# Patient Record
Sex: Male | Born: 1940
Health system: Southern US, Community
[De-identification: ages and names within clinical notes are randomized; demographics above are authoritative.]

## PROBLEM LIST (undated history)

## (undated) DIAGNOSIS — M109 Gout, unspecified: Secondary | ICD-10-CM

## (undated) DIAGNOSIS — J3089 Other allergic rhinitis: Secondary | ICD-10-CM

## (undated) DIAGNOSIS — G4730 Sleep apnea, unspecified: Secondary | ICD-10-CM

## (undated) DIAGNOSIS — K5790 Diverticulosis of intestine, part unspecified, without perforation or abscess without bleeding: Secondary | ICD-10-CM

## (undated) DIAGNOSIS — C801 Malignant (primary) neoplasm, unspecified: Secondary | ICD-10-CM

## (undated) DIAGNOSIS — R7303 Prediabetes: Secondary | ICD-10-CM

## (undated) DIAGNOSIS — M1990 Unspecified osteoarthritis, unspecified site: Secondary | ICD-10-CM

## (undated) DIAGNOSIS — H9190 Unspecified hearing loss, unspecified ear: Secondary | ICD-10-CM

## (undated) DIAGNOSIS — K219 Gastro-esophageal reflux disease without esophagitis: Secondary | ICD-10-CM

## (undated) DIAGNOSIS — I10 Essential (primary) hypertension: Secondary | ICD-10-CM

## (undated) DIAGNOSIS — H04123 Dry eye syndrome of bilateral lacrimal glands: Secondary | ICD-10-CM

## (undated) DIAGNOSIS — J329 Chronic sinusitis, unspecified: Secondary | ICD-10-CM

## (undated) DIAGNOSIS — E7800 Pure hypercholesterolemia, unspecified: Secondary | ICD-10-CM

## (undated) DIAGNOSIS — H409 Unspecified glaucoma: Secondary | ICD-10-CM

## (undated) DIAGNOSIS — L570 Actinic keratosis: Secondary | ICD-10-CM

## (undated) DIAGNOSIS — G245 Blepharospasm: Secondary | ICD-10-CM

## (undated) HISTORY — PX: NASAL SINUS SURGERY: SHX719

## (undated) HISTORY — PX: HERNIA REPAIR: SHX51

## (undated) HISTORY — PX: OTHER SURGICAL HISTORY: SHX169

## (undated) HISTORY — PX: COLONOSCOPY W/ BIOPSIES AND POLYPECTOMY: SHX1376

---

## 2007-04-17 ENCOUNTER — Encounter: Admission: RE | Admit: 2007-04-17 | Discharge: 2007-04-17 | Payer: Self-pay | Admitting: Orthopaedic Surgery

## 2007-05-23 ENCOUNTER — Ambulatory Visit (HOSPITAL_COMMUNITY): Admission: RE | Admit: 2007-05-23 | Discharge: 2007-05-23 | Payer: Self-pay | Admitting: Orthopaedic Surgery

## 2009-03-23 ENCOUNTER — Ambulatory Visit (HOSPITAL_BASED_OUTPATIENT_CLINIC_OR_DEPARTMENT_OTHER): Admission: RE | Admit: 2009-03-23 | Discharge: 2009-03-23 | Payer: Self-pay | Admitting: Orthopedic Surgery

## 2010-04-13 LAB — BASIC METABOLIC PANEL
CO2: 33 mEq/L — ABNORMAL HIGH (ref 19–32)
Calcium: 8.8 mg/dL (ref 8.4–10.5)
Creatinine, Ser: 0.85 mg/dL (ref 0.4–1.5)
Glucose, Bld: 90 mg/dL (ref 70–99)

## 2010-06-07 NOTE — Op Note (Signed)
NAMEVERDIS, KOVAL               ACCOUNT NO.:  [id number]   MEDICAL RECORD NO.:  [id number]          PATIENT TYPE:  AMB   LOCATION:  SDS                          FACILITY:  [facility]   PHYSICIAN:  Lubertha Basque. Dalldorf, M.D.DATE OF BIRTH:  [birth year]   DATE OF PROCEDURE:  05/23/2007  DATE OF DISCHARGE:  05/23/2007                               OPERATIVE REPORT   PREOPERATIVE DIAGNOSES:  1. Right shoulder rotator cuff tear.  2. Right shoulder impingement.   POSTOPERATIVE DIAGNOSES:  1. Right shoulder rotator cuff tear.  2. Right shoulder biceps degeneration.  3. Right shoulder impingement.   PROCEDURES:  1. Right shoulder arthroscopic debridement.  2. Right shoulder arthroscopic acromioplasty.  3. Right shoulder arthroscopic partial claviculectomy.   ANESTHESIA:  General and block.   ATTENDING SURGEON:  Lubertha Basque. Jerl Santos, MD.   ASSISTANT:  Lindwood Qua, PA.   INDICATIONS FOR PROCEDURE:  The patient is a 70 year old male with a  long history of right shoulder pain.  This persisted despite oral anti-  inflammatories and injections and therapy.  By MRI has a retracted  chronic rotator cuff tear.  He has pain which limits his ability to rest  and use his arm.  He was offered arthroscopic debridement and  acromioplasty in hopes of ameliorating his symptoms somewhat and  improving his function.  Informed operative consent was obtained after  discussion of possible complications of reaction to anesthesia and  infection.   SUMMARY OF FINDINGS AND PROCEDURE:  Under general anesthesia and block,  a right shoulder arthroscopy was performed.  The glenohumeral joint  showed no degenerative changes.  His biceps tendon was about 50% torn  and this was addressed with a tenotomy.  He had a large tear of the  rotator cuff retracted back near the glenoid and this was debrided a  little bit further back.  He had a prominent subacromial morphology  addressed with a conservative acromioplasty.   He also had a prominence  of the distal clavicle addressed with a resection of the undersurface of  this bone.   DESCRIPTION OF THE PROCEDURE:  The patient was taken to the operating  suite where general anesthetic was applied without difficulty.  He was  also given a block in the preanesthesia area.  He was positioned in the  beach chair position and prepped and draped in normal sterile fashion.  After administration of IV Kefzol, an arthroscopy of the right shoulder  was performed to the total of 3 portals.  Findings were as noted above.  Procedure consisted of the biceps tenotomy back to the level of glenoid  for this very degenerative tendon.  We then debrided the rotator cuff of  portions of this structure which could be impinging the joint.  We took  the central portion back to the level of the glenoid.  He was left with  anterior and posterior sleeves of tissue.  I performed an acromioplasty  with a bur in the lateral position followed by transfer of the bur to  the posterior position.  I also resected the distal clavicle  undersurface where there  was a smaller spur.  The shoulder was  thoroughly irrigated followed by reapproximation of portals loosely with  nylon.  Adaptic was applied followed by dry gauze tape.  Estimated blood  loss and IV fluids can be obtained from anesthesia records.   DISPOSITION:  The patient was extubated in the operating room and taken  to recovery room in stable condition.  Plans were for him to be  monitored there for a while with probable discharge to home this  afternoon.  If he does end up going home, I will contact him by phone  tonight.      Lubertha Basque Jerl Santos, M.D.  Electronically Signed     PGD/MEDQ  D:  05/23/2007  T:  05/24/2007  Job:  [id number]

## 2010-10-18 LAB — BASIC METABOLIC PANEL
CO2: 31
Calcium: 9.3
Creatinine, Ser: 0.9
Glucose, Bld: 112 — ABNORMAL HIGH
Sodium: 138

## 2010-10-18 LAB — CBC
Hemoglobin: 14
MCHC: 34.3
MCV: 99.3
RDW: 12.7

## 2010-12-09 ENCOUNTER — Encounter: Payer: Self-pay | Admitting: Emergency Medicine

## 2010-12-09 ENCOUNTER — Emergency Department (HOSPITAL_COMMUNITY): Payer: Medicare Other

## 2010-12-09 ENCOUNTER — Emergency Department (HOSPITAL_COMMUNITY)
Admission: EM | Admit: 2010-12-09 | Discharge: 2010-12-09 | Disposition: A | Discharge status: Home / Self Care | Payer: Medicare Other | Attending: Emergency Medicine | Admitting: Emergency Medicine

## 2010-12-09 DIAGNOSIS — I2510 Atherosclerotic heart disease of native coronary artery without angina pectoris: Secondary | ICD-10-CM | POA: Insufficient documentation

## 2010-12-09 DIAGNOSIS — R109 Unspecified abdominal pain: Secondary | ICD-10-CM

## 2010-12-09 DIAGNOSIS — K5730 Diverticulosis of large intestine without perforation or abscess without bleeding: Secondary | ICD-10-CM | POA: Insufficient documentation

## 2010-12-09 DIAGNOSIS — R110 Nausea: Secondary | ICD-10-CM | POA: Insufficient documentation

## 2010-12-09 DIAGNOSIS — I10 Essential (primary) hypertension: Secondary | ICD-10-CM | POA: Insufficient documentation

## 2010-12-09 HISTORY — DX: Unspecified osteoarthritis, unspecified site: M19.90

## 2010-12-09 HISTORY — DX: Essential (primary) hypertension: I10

## 2010-12-09 LAB — CBC
HCT: 40.7 % (ref 39.0–52.0)
Hemoglobin: 14.6 g/dL (ref 13.0–17.0)
MCH: 34.1 pg — ABNORMAL HIGH (ref 26.0–34.0)
MCHC: 35.9 g/dL (ref 30.0–36.0)
MCV: 95.1 fL (ref 78.0–100.0)

## 2010-12-09 LAB — COMPREHENSIVE METABOLIC PANEL
BUN: 10 mg/dL (ref 6–23)
CO2: 32 mEq/L (ref 19–32)
Calcium: 9.3 mg/dL (ref 8.4–10.5)
Creatinine, Ser: 0.78 mg/dL (ref 0.50–1.35)
GFR calc Af Amer: >90 mL/min (ref >90)
GFR calc non Af Amer: >90 mL/min (ref >90)
Glucose, Bld: 143 mg/dL — ABNORMAL HIGH (ref 70–99)
Sodium: 137 mEq/L (ref 135–145)
Total Protein: 7 g/dL (ref 6.0–8.3)

## 2010-12-09 LAB — URINALYSIS, ROUTINE W REFLEX MICROSCOPIC
Hgb urine dipstick: NEGATIVE
Ketones, ur: NEGATIVE mg/dL
Protein, ur: NEGATIVE mg/dL
Urobilinogen, UA: 0.2 mg/dL (ref 0.0–1.0)

## 2010-12-09 LAB — URINE MICROSCOPIC-ADD ON

## 2010-12-09 MED ORDER — OXYCODONE-ACETAMINOPHEN 5-325 MG PO TABS: 1.0000 | ORAL_TABLET | ORAL | Status: AC | PRN

## 2010-12-09 NOTE — ED Notes (Signed)
Pt brought to room my triage NT via wheelchair, pt assisted into bed and into hospital gown.

## 2010-12-09 NOTE — ED Notes (Signed)
C/o right lower quadrant pain radiating into right flank region--Onset Wednesday with gradual worsening--Also c/o nausea but, no actual vomiting--Rates his pain a 4 on 1-10 scale--Denies any urinary symptoms

## 2010-12-09 NOTE — ED Provider Notes (Addendum)
History     CSN: [id number] Arrival date & time: 12/09/2010 10:22 AM   First MD Initiated Contact with Patient 12/09/10 1029      Chief Complaint  Patient presents with  . Flank Pain    (Consider location/radiation/quality/duration/timing/severity/associated sxs/prior treatment) Patient is a 71 y.o. male presenting with flank pain. The history is provided by the patient and the spouse.  Flank Pain The current episode started yesterday. The problem has been gradually worsening.  Christopher Acevedo is a 70 y.o. male presents with c/o right flank pain leading to desire to be assessed in the ED. The sx(s) have been present for 2 days ago. Additional concerns are nausea. Causative factors are nothing. Palliative factors are nothing. The distress associated is moderate. The disorder has been present for 2 days.  Past Medical History  Diagnosis Date  . Hypertension   . Arthritis   . COPD (chronic obstructive pulmonary disease)   . Coronary artery disease     History reviewed. No pertinent past surgical history.  Family History  Problem Relation Age of Onset  . Rheum arthritis Mother     History  Substance Use Topics  . Smoking status: Former Games developer  . Smokeless tobacco: Not on file  . Alcohol Use: 2.4 oz/week    4 Shots of liquor per week      Review of Systems  Genitourinary: Positive for flank pain.  All other systems reviewed and are negative.    Allergies  Codeine  Home Medications   Current Outpatient Rx  Name Route Sig Dispense Refill  . AMLODIPINE BESYLATE 10 MG PO TABS Oral Take 5 mg by mouth daily. Pt takes 1/2 tab     . CARBOXYMETH-GLYCERIN-POLYSORB 0.5-1-0.5 % OP SOLN Ophthalmic Apply 1 drop to eye daily.      Marland Kitchen VITAMIN D 1000 UNITS PO TABS Oral Take 1,000 Units by mouth daily.      . IBUPROFEN 600 MG PO TABS Oral Take 600 mg by mouth every 6 (six) hours as needed. pain     . FISH OIL 1000 MG PO CAPS Oral Take 1 capsule by mouth daily.      Marland Kitchen OMEPRAZOLE  20 MG PO CPDR Oral Take 20 mg by mouth daily.      Marland Kitchen SIMVASTATIN 20 MG PO TABS Oral Take 20 mg by mouth at bedtime. Pt takes 1/2 tab     . TRAMADOL HCL 50 MG PO TABS Oral Take 50 mg by mouth every 6 (six) hours as needed. Maximum dose= 8 tablets per day  pain     . TRAVOPROST 0.004 % OP SOLN Left Eye Place 1 drop into the left eye every morning.        BP 132/83  Pulse 66  Temp(Src) 97.9 F (36.6 C) (Oral)  Resp 16  SpO2 99%  Physical Exam  Nursing note and vitals reviewed. Constitutional: He is oriented to person, place, and time. He appears well-developed and well-nourished.  HENT:  Head: Normocephalic and atraumatic.  Right Ear: External ear normal.  Left Ear: External ear normal.  Eyes: Conjunctivae and EOM are normal. Pupils are equal, round, and reactive to light.  Neck: Normal range of motion and phonation normal. Neck supple.  Cardiovascular: Normal rate, regular rhythm, normal heart sounds and intact distal pulses.   Pulmonary/Chest: Effort normal and breath sounds normal. He exhibits no bony tenderness.  Abdominal: Soft. Normal appearance. There is no tenderness (diffuse right abdominal tenderness, mild.).  Musculoskeletal: Normal range of  motion.  Neurological: He is alert and oriented to person, place, and time. He has normal strength. No cranial nerve deficit or sensory deficit. He exhibits normal muscle tone. Coordination normal.  Skin: Skin is warm, dry and intact.  Psychiatric: He has a normal mood and affect. His behavior is normal. Judgment and thought content normal.    ED Course  Procedures (including critical care time)  Patient was reevaluated. He had no further complaints. Does with the patient and his wife.    Labs Reviewed  URINALYSIS, ROUTINE W REFLEX MICROSCOPIC - Abnormal; Notable for the following:    APPearance TURBID (*)    pH 8.5 (*)    All other components within normal limits  URINE MICROSCOPIC-ADD ON - Abnormal; Notable for the following:     Bacteria, UA FEW (*)    All other components within normal limits  CBC - Abnormal; Notable for the following:    MCH 34.1 (*)    All other components within normal limits  COMPREHENSIVE METABOLIC PANEL - Abnormal; Notable for the following:    Potassium 3.4 (*)    Glucose, Bld 143 (*)    All other components within normal limits  URINE CULTURE  LAB REPORT - SCANNED   No results found.   1. Flank pain       MDM  Patient has subacute right flank to right abdominal pain with no clear source of the pain. He does have degenerative joint disease visualized on the CAT scan when I reviewed it. He does not have apparent diverticulitis and there is no evidence for appendicitis. He is stable for discharge with symptomatic treatment.        Flint Melter, MD 12/09/10 1720  Flint Melter, MD 02/09/11 1322  Flint Melter, MD 02/09/11 1341

## 2010-12-11 LAB — URINE CULTURE
Colony Count: 75000
Culture  Setup Time: 201211162322

## 2010-12-12 NOTE — ED Notes (Signed)
+   Urine Chart sent to EDP office for review. 

## 2011-02-07 ENCOUNTER — Other Ambulatory Visit: Payer: Self-pay | Admitting: Gastroenterology

## 2011-04-11 ENCOUNTER — Other Ambulatory Visit (HOSPITAL_COMMUNITY): Payer: Self-pay | Admitting: Gastroenterology

## 2011-04-11 DIAGNOSIS — K635 Polyp of colon: Secondary | ICD-10-CM

## 2011-04-18 ENCOUNTER — Ambulatory Visit (HOSPITAL_COMMUNITY)
Admission: RE | Admit: 2011-04-18 | Discharge: 2011-04-18 | Disposition: A | Discharge status: Home / Self Care | Payer: Medicare Other | Source: Ambulatory Visit | Attending: Gastroenterology | Admitting: Gastroenterology

## 2011-04-18 DIAGNOSIS — K5730 Diverticulosis of large intestine without perforation or abscess without bleeding: Secondary | ICD-10-CM | POA: Insufficient documentation

## 2011-04-18 DIAGNOSIS — D126 Benign neoplasm of colon, unspecified: Secondary | ICD-10-CM | POA: Insufficient documentation

## 2011-04-18 DIAGNOSIS — K635 Polyp of colon: Secondary | ICD-10-CM

## 2011-07-12 ENCOUNTER — Other Ambulatory Visit: Payer: Self-pay | Admitting: Family Medicine

## 2011-07-18 ENCOUNTER — Ambulatory Visit
Admission: RE | Admit: 2011-07-18 | Discharge: 2011-07-18 | Disposition: A | Discharge status: Home / Self Care | Payer: Medicare Other | Source: Ambulatory Visit | Attending: Family Medicine | Admitting: Family Medicine

## 2011-07-25 ENCOUNTER — Other Ambulatory Visit (HOSPITAL_COMMUNITY): Payer: Self-pay | Admitting: Family Medicine

## 2011-07-26 ENCOUNTER — Other Ambulatory Visit (HOSPITAL_COMMUNITY): Payer: Self-pay | Admitting: Family Medicine

## 2011-07-26 DIAGNOSIS — I714 Abdominal aortic aneurysm, without rupture, unspecified: Secondary | ICD-10-CM

## 2011-08-08 ENCOUNTER — Ambulatory Visit (HOSPITAL_COMMUNITY)
Admission: RE | Admit: 2011-08-08 | Discharge: 2011-08-08 | Disposition: A | Discharge status: Home / Self Care | Payer: Medicare Other | Source: Ambulatory Visit | Attending: Family Medicine | Admitting: Family Medicine

## 2011-08-08 DIAGNOSIS — I714 Abdominal aortic aneurysm, without rupture, unspecified: Secondary | ICD-10-CM | POA: Insufficient documentation

## 2011-08-08 DIAGNOSIS — K5730 Diverticulosis of large intestine without perforation or abscess without bleeding: Secondary | ICD-10-CM | POA: Insufficient documentation

## 2011-08-08 MED ORDER — IOHEXOL 350 MG/ML SOLN
100.0000 mL | Freq: Once | INTRAVENOUS | Status: AC | PRN
  Administered 2011-08-08: 100 mL via INTRAVENOUS

## 2013-08-02 IMAGING — CT CT CTA ABD/PEL W/CM AND/OR W/O CM
2 of 6 series · 14 of 46 positions shown, 18 images · IV contrast (APPLIED)
Comparison: Abdominal CT 12/09/2010

CLINICAL DATA: Evaluate abdominal aortic aneurysm.

CT ANGIOGRAPHY ABDOMEN AND PELVIS
TECHNIQUE: Multidetector CT imaging of the abdomen and pelvis was
performed using the standard protocol during bolus administration
of intravenous contrast.  Multiplanar reconstructed images
including MIPs were obtained and reviewed to evaluate the vascular
anatomy.
Contrast: 100mL OMNIPAQUE IOHEXOL 350 MG/ML SOLN

[Series 6: dissection 2.0 st · axial · 0.92mm/px · z∈[-586,-192]mm · 11 of 227 slices shown, 15 images]
[im 20/227  soft-tissue]
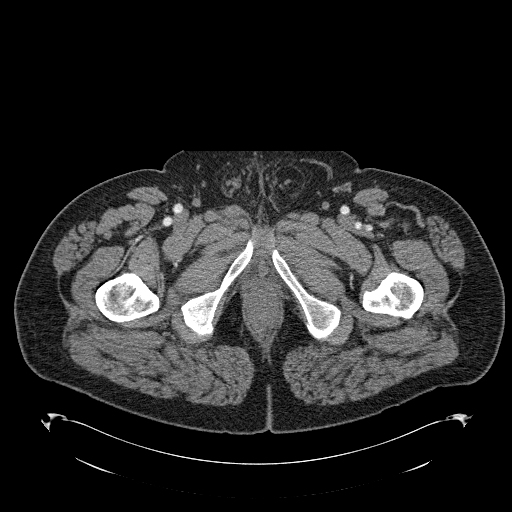
[im 20/227  bone]
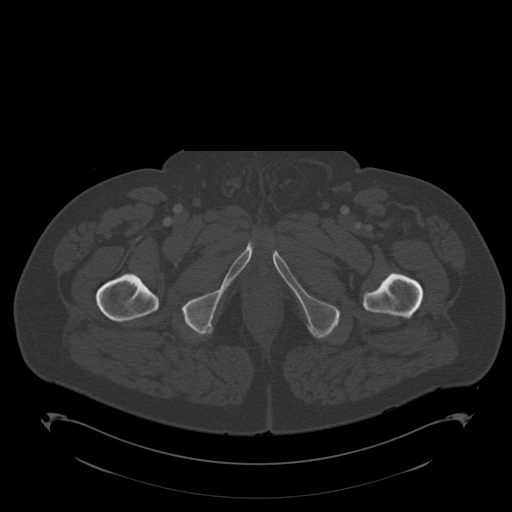
[im 40/227  soft-tissue]
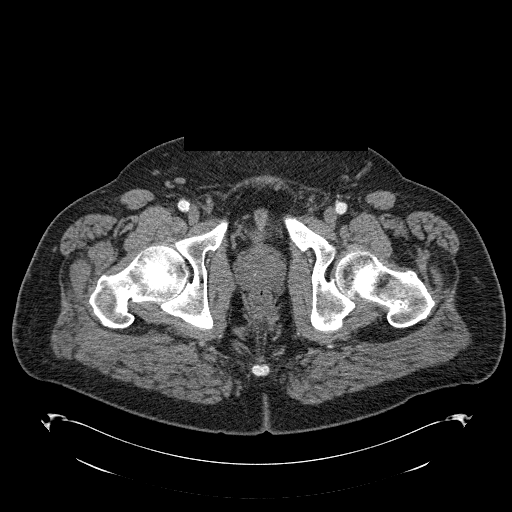
[im 69/227  soft-tissue]
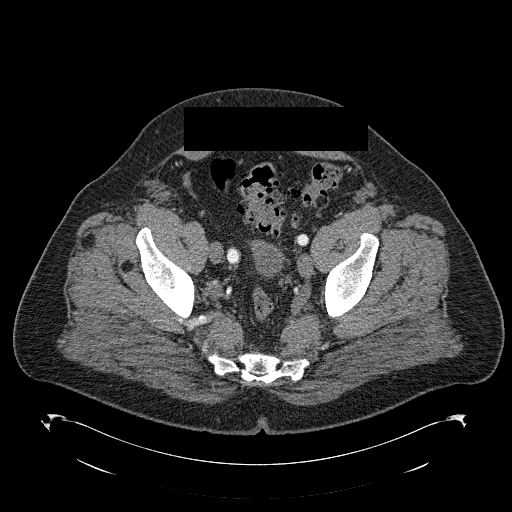
[im 89/227  soft-tissue]
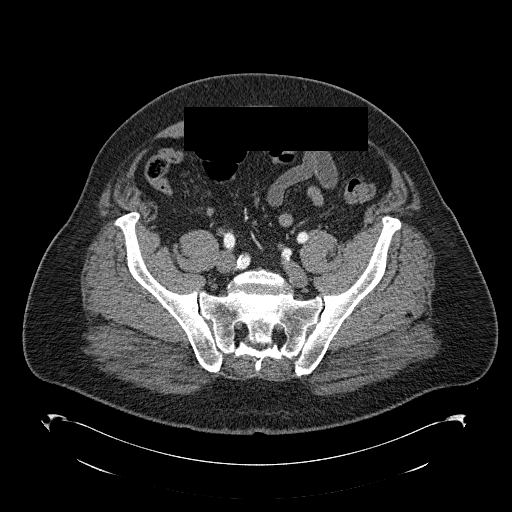
[im 118/227  soft-tissue]
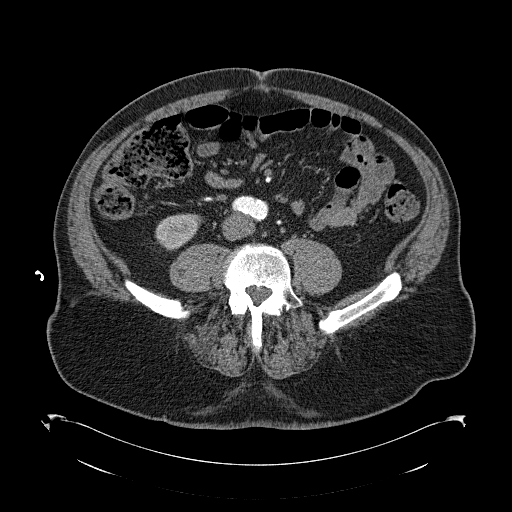
[im 138/227  soft-tissue]
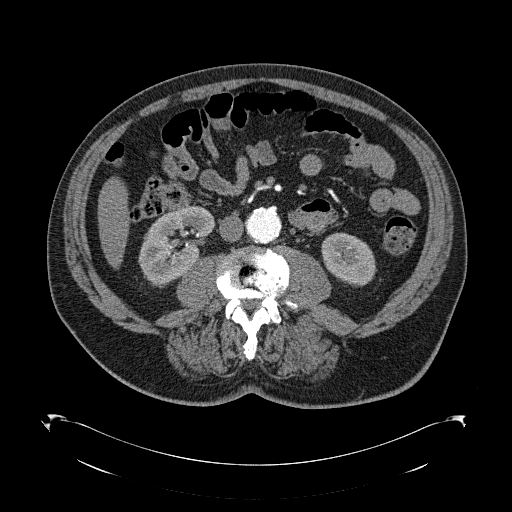
[im 158/227  soft-tissue]
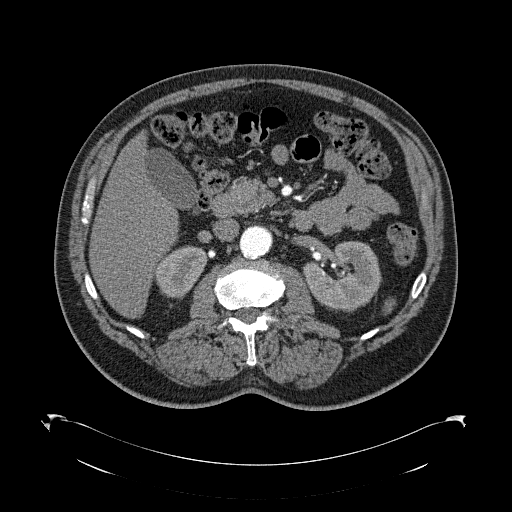
[im 187/227  soft-tissue]
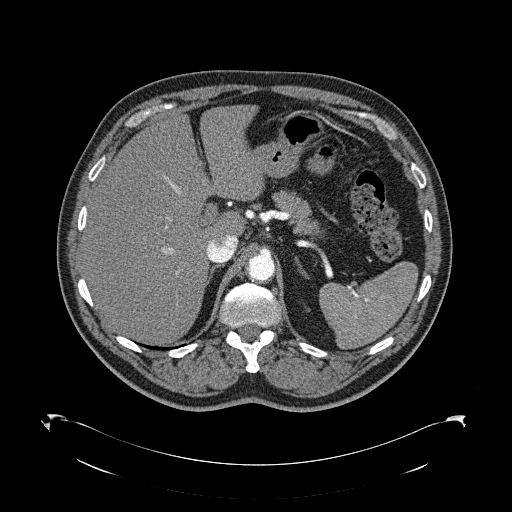
[im 187/227  lung]
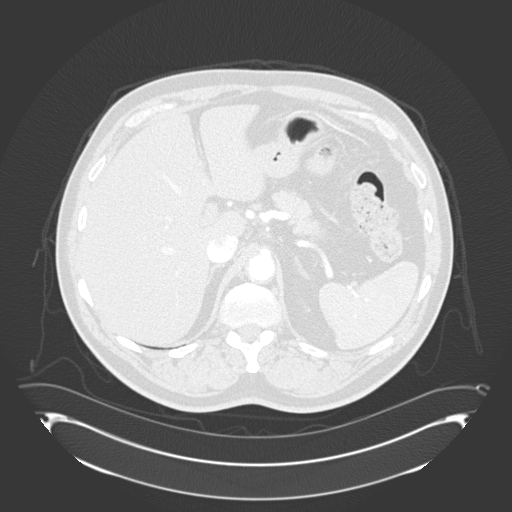
[im 197/227  lung]
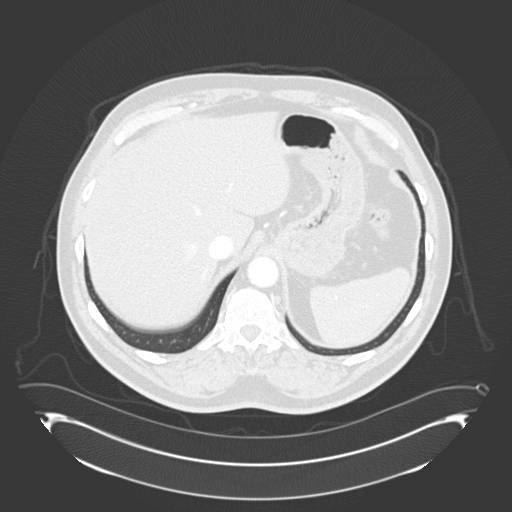
[im 207/227  soft-tissue]
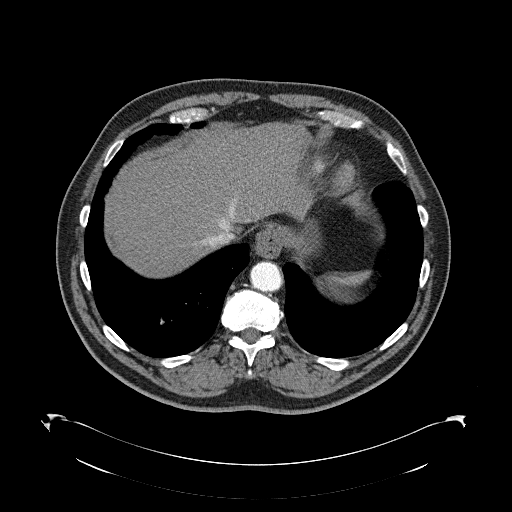
[im 207/227  lung]
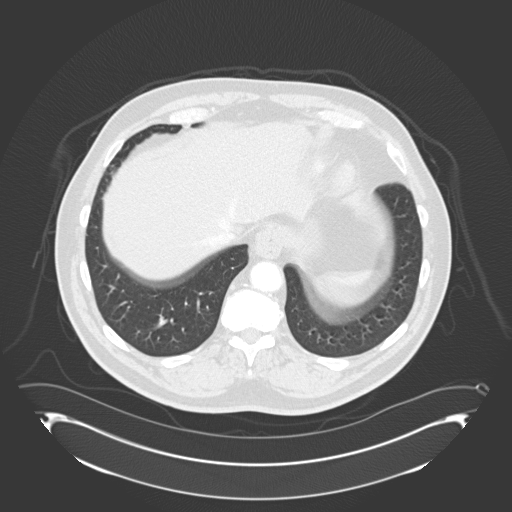
[im 207/227  bone]
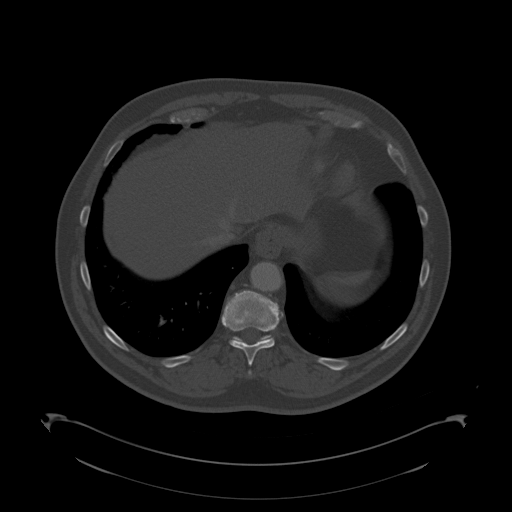
[im 217/227  lung]
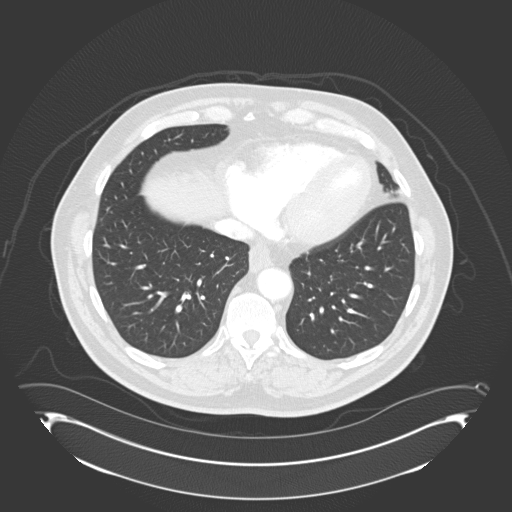

[Series 602: coronal · coronal · 0.92mm/px · 3 of 109 slices shown]
[im 28/109  soft-tissue]
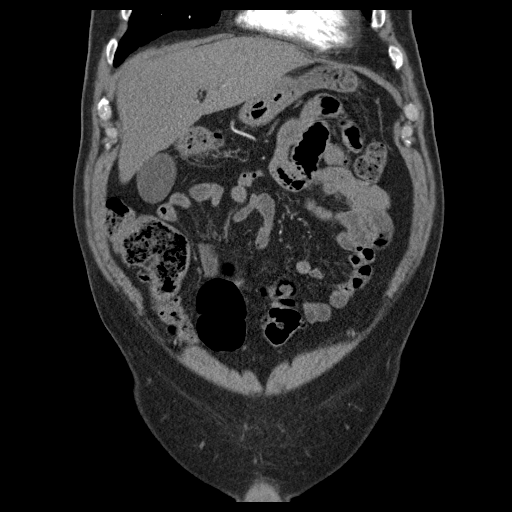
[im 55/109  soft-tissue]
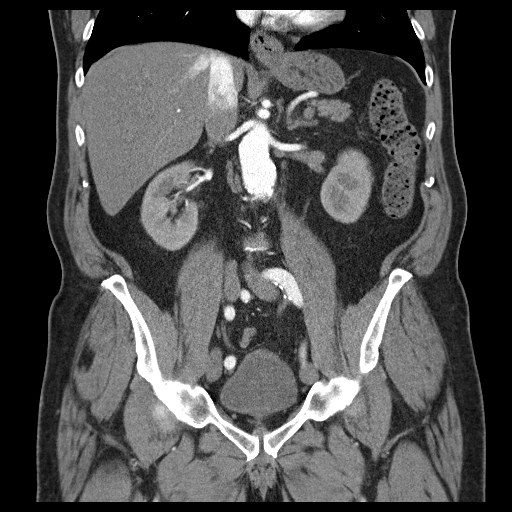
[im 82/109  soft-tissue]
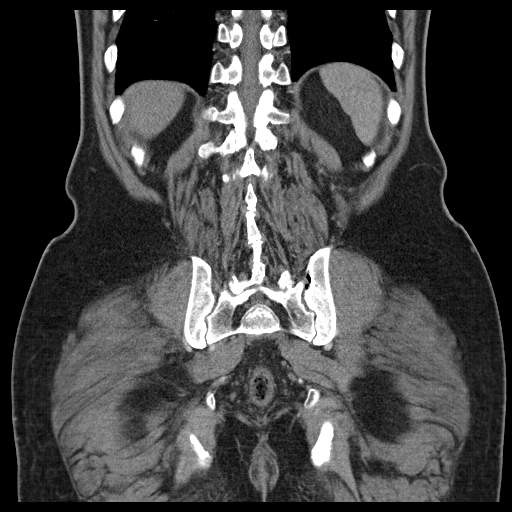

[14 of 46 positions shown; findings below may reference images not displayed]

FINDINGS: Vascular structures: The abdominal aorta is patent with
mild atherosclerotic changes.  The celiac trunk, superior
mesenteric artery, bilateral renal arteries and inferior mesenteric
artery are patent.  No significant visceral artery stenosis.  There
is an infrarenal abdominal aortic aneurysm that measures up to
cm in the AP dimension and unchanged from 1141.  Stable ectasia of
the right common iliac artery measuring up to 1.5 cm.  The iliac
arteries are patent bilaterally.  The common femoral arteries and
proximal femoral arteries are patent bilaterally.

Nonvascular structures:  The lung bases are clear.  Evaluation of
the intra-abdominal organs is limited due to the arterial phase
contrast.  There is slightly decreased attenuation of the liver
parenchyma.  There is no gross abnormality to the liver,
gallbladder, spleen, pancreas, adrenal glands or kidneys.  There is
no evidence for abdominal lymphadenopathy or free fluid.  The
appendix has a normal appearance.  There is extensive
diverticulosis involving the sigmoid colon but no acute
inflammatory changes.  No gross abnormality to the prostate or
seminal vesicles.  Again noted are two small calcifications along
the anterior aspect of the urinary bladder and one calcification
may be within the bladder wall.  These findings are similar to the
prior examination.

There is a right L5 pars defect which is stable.  Marked
degenerative changes in the lumbar spine.  Review of the MIP images
confirms the above findings.
IMPRESSION: Stable abdominal aortic aneurysm measuring up to 3.6 cm.

No acute abdominal or pelvic findings.

Sigmoid diverticulosis without acute colonic inflammation.

Stable calcifications along the anterior wall of the urinary
bladder.

## 2013-09-24 ENCOUNTER — Encounter (INDEPENDENT_AMBULATORY_CARE_PROVIDER_SITE_OTHER): Payer: Medicare Other | Admitting: Ophthalmology

## 2013-09-24 DIAGNOSIS — H35039 Hypertensive retinopathy, unspecified eye: Secondary | ICD-10-CM

## 2013-09-24 DIAGNOSIS — H2510 Age-related nuclear cataract, unspecified eye: Secondary | ICD-10-CM

## 2013-09-24 DIAGNOSIS — H43819 Vitreous degeneration, unspecified eye: Secondary | ICD-10-CM

## 2013-09-24 DIAGNOSIS — I10 Essential (primary) hypertension: Secondary | ICD-10-CM

## 2013-11-12 ENCOUNTER — Other Ambulatory Visit (INDEPENDENT_AMBULATORY_CARE_PROVIDER_SITE_OTHER): Payer: Self-pay | Admitting: Surgery

## 2013-11-12 ENCOUNTER — Encounter (INDEPENDENT_AMBULATORY_CARE_PROVIDER_SITE_OTHER): Payer: Self-pay | Admitting: Surgery

## 2013-11-12 NOTE — H&P (Signed)
Abb JArgus Caraher 11/12/2013 9:35 AM Location: [facility] Patient #: [id number] DOB: 01/12/1941 Married / Language: Cleophus Molt / Race: White Male  History of Present Illness Adin Hector MD; 11/12/2013 3:58 PM) Patient words: new Pt. hem consult.  The patient is a 73 year old male who presents with a complaint of anal problems. Pleasant obese male that is struggled with hemorrhoid problems for many years. Feels a hemorrhoid all the time. Occasionally bleeds. Often has leaking. Hard to keep the area clean. Uses but wipes. Can give intermittent rashes. History of diverticulosis by colonoscopy followed by Dr. Paulita Fujita. Normally has some constipation with bowel movements every other day. Usually treat hemorrhoids with cortisone ointments. That helps. Had a tense flare a month ago. Saw Dr Jonathon Jordan, his primary care physician. Surgical consultation recommended. He is feeling better now but still struggles. Patient also notes he has a lump on his inner thigh and buttock that is irritating and bothersome. Close to the anus. Wonders if that can be removed. He is rather active. Goes golfing several times a week. No history of cardiac reported problems. No history of Crohn's or ulcerative colitis.   Other Problems Briant Cedar, CMA; 11/12/2013 9:35 AM) Arthritis Back Pain Diverticulosis Hemorrhoids High blood pressure Hypercholesterolemia Sleep Apnea  Past Surgical History Briant Cedar, CMA; 11/12/2013 9:35 AM) Colon Polyp Removal - Colonoscopy Colon Polyp Removal - Open Shoulder Surgery Right.  Diagnostic Studies History Briant Cedar, [place]; 11/12/2013 9:35 AM) Colonoscopy 1-5 years ago  Allergies Briant Cedar, [place]; 11/12/2013 9:39 AM) Codeine/Codeine Derivatives  Medication History Briant Cedar, CMA; 11/12/2013 9:42 AM) Erythromycin (5MG /GM Ointment, Ophthalmic) Active. Norvasc (10MG  Tablet, Oral) Active. Omeprazole  (20MG  Capsule DR, Oral) Active. Simvastatin (20MG  Tablet, Oral) Active. TraMADol HCl (50MG  Tablet Disperse, Oral) Active.  Social History Briant Cedar, [place]; 11/12/2013 9:35 AM) Alcohol use Moderate alcohol use. Caffeine use Carbonated beverages, Coffee, Tea. No drug use Tobacco use Former smoker.  Family History Briant Cedar, [place]; 11/12/2013 9:35 AM) Alcohol Abuse Brother, Father. Heart Disease Mother. Heart disease in male family member before age 78 Hypertension Brother. Ischemic Bowel Disease Brother, Sister.  Review of Systems Briant Cedar CMA; 11/12/2013 9:35 AM) General Present- Fatigue. Not Present- Appetite Loss, Chills, Fever, Night Sweats, Weight Gain and Weight Loss. Skin Present- Dryness. Not Present- Change in Wart/Mole, Hives, Jaundice, New Lesions, Non-Healing Wounds, Rash and Ulcer. HEENT Present- Hearing Loss, Seasonal Allergies and Wears glasses/contact lenses. Not Present- Earache, Hoarseness, Nose Bleed, Oral Ulcers, Ringing in the Ears, Sinus Pain, Sore Throat, Visual Disturbances and Yellow Eyes. Respiratory Present- Snoring. Not Present- Bloody sputum, Chronic Cough, Difficulty Breathing and Wheezing. Breast Not Present- Breast Mass, Breast Pain, Nipple Discharge and Skin Changes. Gastrointestinal Present- Abdominal Pain, Constipation, Hemorrhoids and Rectal Pain. Not Present- Bloating, Bloody Stool, Change in Bowel Habits, Chronic diarrhea, Difficulty Swallowing, Excessive gas, Gets full quickly at meals, Indigestion, Nausea and Vomiting. Male Genitourinary Present- Frequency. Not Present- Blood in Urine, Change in Urinary Stream, Impotence, Nocturia, Painful Urination, Urgency and Urine Leakage. Musculoskeletal Present- Back Pain, Joint Stiffness and Muscle Pain. Not Present- Joint Pain, Muscle Weakness and Swelling of Extremities. Psychiatric Not Present- Anxiety, Bipolar, Change in Sleep Pattern, Depression, Fearful and Frequent  crying. Endocrine Not Present- Cold Intolerance, Excessive Hunger, Hair Changes, Heat Intolerance, Hot flashes and New Diabetes. Hematology Not Present- Easy Bruising, Excessive bleeding, Gland problems, HIV and Persistent Infections.   Vitals Briant Cedar CMA; 11/12/2013 9:37 AM) 11/12/2013 9:37 AM Weight: 256.13 lb Height: 71.5in Body Surface Area: 2.42  m Body Mass Index: 35.22 kg/m Temp.: 97.36F  Pulse: 64 (Regular)  BP: 128/80 (Sitting, Left Arm, Standard)    Physical Exam Adin Hector MD; 11/12/2013 10:01 AM) General Mental Status-Alert. General Appearance-Not in acute distress, Not Sickly. Orientation-Oriented X3. Hydration-Well hydrated. Voice-Normal.  Integumentary Global Assessment Upon inspection and palpation of skin surfaces of the - Axillae: non-tender, no inflammation or ulceration, no drainage. and Distribution of scalp and body hair is normal. General Characteristics Temperature - normal warmth is noted.  Head and Neck Head-normocephalic, atraumatic with no lesions or palpable masses. Face Global Assessment - atraumatic, no absence of expression. Neck Global Assessment - no abnormal movements, no bruit auscultated on the right, no bruit auscultated on the left, no decreased range of motion, non-tender. Trachea-midline. Thyroid Gland Characteristics - non-tender.  Eye Eyeball - Left-Extraocular movements intact, No Nystagmus. Eyeball - Right-Extraocular movements intact, No Nystagmus. Cornea - Left-No Hazy. Cornea - Right-No Hazy. Sclera/Conjunctiva - Left-No scleral icterus, No Discharge. Sclera/Conjunctiva - Right-No scleral icterus, No Discharge. Pupil - Left-Direct reaction to light normal. Pupil - Right-Direct reaction to light normal.  ENMT Ears Pinna - Left - no drainage observed, no generalized tenderness observed. Right - no drainage observed, no generalized tenderness observed. Nose and  Sinuses External Inspection of the Nose - no destructive lesion observed. Inspection of the nares - Left - quiet respiration. Right - quiet respiration. Mouth and Throat Lips - Upper Lip - no fissures observed, no pallor noted. Lower Lip - no fissures observed, no pallor noted. Nasopharynx - no discharge present. Oral Cavity/Oropharynx - Tongue - no dryness observed. Oral Mucosa - no cyanosis observed. Hypopharynx - no evidence of airway distress observed.  Chest and Lung Exam Inspection Movements - Normal and Symmetrical. Accessory muscles - No use of accessory muscles in breathing. Palpation Palpation of the chest reveals - Non-tender. Auscultation Breath sounds - Normal and Clear.  Cardiovascular Auscultation Rhythm - Regular. Murmurs & Other Heart Sounds - Auscultation of the heart reveals - No Murmurs and No Systolic Clicks.  Abdomen Inspection Inspection of the abdomen reveals - No Visible peristalsis and No Abnormal pulsations. Umbilicus - No Bleeding, No Urine drainage. Palpation/Percussion Palpation and Percussion of the abdomen reveal - Soft, Non Tender, No Rebound tenderness, No Rigidity (guarding) and No Cutaneous hyperesthesia.  Male Genitourinary Sexual Maturity Tanner 5 - Adult hair pattern and Adult penile size and shape.  Rectal Note: Pedunculated skin tag on the right gluteal region 2 x 2 centimeters. Not inflamed.  Hygiene fair around the anus. Chronic posterior midline external hemorrhoid sentinel tag. Chronic anal fissure. Mildly sensitive but not severe. Normal sphincter tone. Anoscopy done. Moderately enlarged right posterior , right anterior > L lateral internal hemorrhoid piles. No fistula. No condyloma. Prostate mildly enlarged but soft.   Peripheral Vascular Upper Extremity Inspection - Left - No Cyanotic nailbeds, Not Ischemic. Right - No Cyanotic nailbeds, Not Ischemic.  Neurologic Neurologic evaluation reveals -normal attention span and ability  to concentrate, able to name objects and repeat phrases. Appropriate fund of knowledge , normal sensation and normal coordination. Mental Status Affect - not angry, not paranoid. Cranial Nerves-Normal Bilaterally. Gait-Normal.  Neuropsychiatric Mental status exam performed with findings of-able to articulate well with normal speech/language, rate, volume and coherence, thought content normal with ability to perform basic computations and apply abstract reasoning and no evidence of hallucinations, delusions, obsessions or homicidal/suicidal ideation.  Musculoskeletal Global Assessment Spine, Ribs and Pelvis - no instability, subluxation or laxity. Right Upper Extremity -  no instability, subluxation or laxity.  Lymphatic Head & Neck  General Head & Neck Lymphatics: Bilateral - Description - No Localized lymphadenopathy. Axillary  General Axillary Region: Bilateral - Description - No Localized lymphadenopathy. Femoral & Inguinal  Generalized Femoral & Inguinal Lymphatics: Left - Description - No Localized lymphadenopathy. Right - Description - No Localized lymphadenopathy.    Assessment & Plan Adin Hector MD; 11/12/2013 10:16 AM) CHRONIC POSTERIOR ANAL FISSURE (565.0  K60.1) Impression: I believe that the fissure is created a sentinel tag that allows leaking and pruritis ani. I think he would benefit from examination under anesthesia to remove the sentinel tag with possible sphincterotomy. r/o an abscess/fistula.  The anatomy & physiology of the anorectal region was discussed. The pathophysiology of anal fissure and differential diagnosis was discussed. Natural history progression was discussed. I stressed the importance of a bowel regimen to have daily soft bowel movements to minimize progression of disease.  The patient's condition is not adequately controlled. Non-operative treatment has not healed the fissure. Therefore, I recommended examination under anesthesia for  better examination to confirm the diagnosis and treat by lateral internal sphincterotomy to relax the spasm better & allow the fissure to heal. Technique, benefits, alternatives were discussed. I noted a good likelihood this will help address the problem. Risks such as bleeding, pain, incontinence, recurrence, heart attack, death, and other risks were discussed.  Educational handouts further explaining the pathology, treatment options, and bowel regimen were given as well. The patient expressed understanding & wishes to proceed with surgery. Current Plans  Pt Education - [facility] Good Bowel Health (Benjermin Korber) Pt Education - [facility] Rectal Surgery HCI (Akari Crysler): discussed with patient and provided information. ANOSCOPY, DIAGNOSTIC (46600) PRURITUS ANI (698.0  L29.0) Impression: Most likely side effect from chronic leaking from anal fissure and sentinel pack. Hopefully will improve after surgery INTERNAL BLEEDING HEMORRHOIDS (455.2  K64.8) Impression: I think he would benefit from concurrent hemorrhoidal ligation. Would plan THD approach to minimize scarring. He agrees  The anatomy & physiology of the anorectal region was discussed. The pathophysiology of hemorrhoids and differential diagnosis was discussed. Natural history risks without surgery was discussed. I stressed the importance of a bowel regimen to have daily soft bowel movements to minimize progression of disease. Interventions such as sclerotherapy & banding were discussed.  The patient's symptoms are not adequately controlled by medicines and other non-operative treatments. I feel the risks & problems of no surgery outweigh the operative risks; therefore, I recommended surgery to treat the hemorrhoids by ligation, pexy, and possible resection.  Risks such as bleeding, infection, urinary difficulties, need for further treatment, heart attack, death, and other risks were discussed. I noted a good likelihood this will help address the problem. Goals of  post-operative recovery were discussed as well. Possibility that this will not correct all symptoms was explained. Post-operative pain, bleeding, constipation, and other problems after surgery were discussed. We will work to minimize complications. Educational handouts further explaining the pathology, treatment options, and bowel regimen were given as well. Questions were answered. The patient expresses understanding & wishes to proceed with surgery. 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 He wishes to proceed  The pathophysiology of skin & subcutaneous masses was discussed. Natural history risks without surgery were discussed. I recommended surgery to remove the mass. I explained the technique of removal with  use of local anesthesia & possible need for more aggressive sedation/anesthesia for patient comfort.  Risks such as bleeding, infection, heart attack, death, and other risks were discussed. I noted a good likelihood this will help address the problem. Possibility that this will not correct all symptoms was explained. Possibility of regrowth/recurrence of the mass was discussed. We will work to minimize complications. Questions were answered. The patient expresses understanding & wishes to proceed with surgery. Current Plans Schedule for Surgery   Signed by Adin Hector, MD (11/12/2013 3:58 PM)

## 2013-11-26 ENCOUNTER — Ambulatory Visit (HOSPITAL_COMMUNITY)
Admission: RE | Admit: 2013-11-26 | Discharge: 2013-11-26 | Disposition: A | Discharge status: Home / Self Care | Payer: Medicare Other | Source: Ambulatory Visit | Attending: Anesthesiology | Admitting: Anesthesiology

## 2013-11-26 ENCOUNTER — Encounter (HOSPITAL_COMMUNITY): Payer: Self-pay

## 2013-11-26 ENCOUNTER — Encounter (HOSPITAL_COMMUNITY)
Admission: RE | Admit: 2013-11-26 | Discharge: 2013-11-26 | Disposition: A | Discharge status: Home / Self Care | Payer: Medicare Other | Source: Ambulatory Visit | Attending: Surgery | Admitting: Surgery

## 2013-11-26 DIAGNOSIS — I4510 Unspecified right bundle-branch block: Secondary | ICD-10-CM | POA: Diagnosis not present

## 2013-11-26 DIAGNOSIS — I10 Essential (primary) hypertension: Secondary | ICD-10-CM | POA: Insufficient documentation

## 2013-11-26 DIAGNOSIS — Z01811 Encounter for preprocedural respiratory examination: Secondary | ICD-10-CM | POA: Diagnosis not present

## 2013-11-26 DIAGNOSIS — Z01812 Encounter for preprocedural laboratory examination: Secondary | ICD-10-CM | POA: Insufficient documentation

## 2013-11-26 DIAGNOSIS — Z01810 Encounter for preprocedural cardiovascular examination: Secondary | ICD-10-CM | POA: Insufficient documentation

## 2013-11-26 HISTORY — DX: Gastro-esophageal reflux disease without esophagitis: K21.9

## 2013-11-26 HISTORY — DX: Blepharospasm: G24.5

## 2013-11-26 HISTORY — DX: Unspecified hearing loss, unspecified ear: H91.90

## 2013-11-26 HISTORY — DX: Diverticulosis of intestine, part unspecified, without perforation or abscess without bleeding: K57.90

## 2013-11-26 HISTORY — DX: Sleep apnea, unspecified: G47.30

## 2013-11-26 HISTORY — DX: Actinic keratosis: L57.0

## 2013-11-26 HISTORY — DX: Pure hypercholesterolemia, unspecified: E78.00

## 2013-11-26 HISTORY — DX: Unspecified glaucoma: H40.9

## 2013-11-26 HISTORY — DX: Dry eye syndrome of bilateral lacrimal glands: H04.123

## 2013-11-26 LAB — CBC
HEMATOCRIT: 40.5 % (ref 39.0–52.0)
HEMOGLOBIN: 13.7 g/dL (ref 13.0–17.0)
MCH: 32 pg (ref 26.0–34.0)
MCHC: 33.8 g/dL (ref 30.0–36.0)
MCV: 94.6 fL (ref 78.0–100.0)
Platelets: 188 10*3/uL (ref 150–400)
RBC: 4.28 MIL/uL (ref 4.22–5.81)
RDW: 12.3 % (ref 11.5–15.5)
WBC: 8.1 10*3/uL (ref 4.0–10.5)

## 2013-11-26 LAB — BASIC METABOLIC PANEL
Anion gap: 6 (ref 5–15)
BUN: 10 mg/dL (ref 6–23)
CALCIUM: 9.3 mg/dL (ref 8.4–10.5)
CO2: 32 meq/L (ref 19–32)
CREATININE: 0.97 mg/dL (ref 0.50–1.35)
Chloride: 100 mEq/L (ref 96–112)
GFR calc Af Amer: >90 mL/min (ref >90)
GFR calc non Af Amer: 81 mL/min — ABNORMAL LOW (ref >90)
GLUCOSE: 89 mg/dL (ref 70–99)
Potassium: 4 mEq/L (ref 3.7–5.3)
Sodium: 138 mEq/L (ref 137–147)

## 2013-11-26 MED ORDER — CHLORHEXIDINE GLUCONATE 4 % EX LIQD: 1.0000 "application " | Freq: Once | CUTANEOUS | Status: DC

## 2013-11-26 NOTE — Pre-Procedure Instructions (Signed)
EKG AND CXR WERE DONE TODAY PREOP AT [facility]. CHART GIVEN TO FOLLOW UP NURSE SHARON SHOFFNER FOR REVIEW OF ALL PREOP TESTS

## 2013-11-26 NOTE — Patient Instructions (Signed)
YOUR SURGERY IS SCHEDULED AT [facility]  ON:  Tuesday  11/10  REPORT TO  SHORT STAY CENTER AT:  9:00 AM   DO NOT EAT OR DRINK ANYTHING AFTER MIDNIGHT THE NIGHT BEFORE YOUR SURGERY.  YOU MAY BRUSH YOUR TEETH, RINSE OUT YOUR MOUTH--BUT NO WATER, NO FOOD, NO CHEWING GUM, NO MINTS, NO CANDIES, NO CHEWING TOBACCO.  PLEASE TAKE THE FOLLOWING MEDICATIONS THE AM OF YOUR SURGERY WITH A FEW SIPS OF WATER:  AMLODIPINE, OMEPRAZOLE, PRAVASTATIN. YOU MAY USE YOUR RESTASIS EYE DROPS   IF YOU HAVE SLEEP APNEA AND USE CPAP OR BIPAP--PLEASE BRING THE MASK AND THE TUBING.  DO NOT BRING YOUR MACHINE.  DO NOT BRING VALUABLES, MONEY, CREDIT CARDS.  DO NOT WEAR JEWELRY, MAKE-UP, NAIL POLISH AND NO METAL PINS OR CLIPS IN YOUR HAIR. CONTACT LENS, DENTURES / PARTIALS, GLASSES SHOULD NOT BE WORN TO SURGERY AND IN MOST CASES-HEARING AIDS WILL NEED TO BE REMOVED.  BRING YOUR GLASSES CASE, ANY EQUIPMENT NEEDED FOR YOUR CONTACT LENS. FOR PATIENTS ADMITTED TO THE HOSPITAL--CHECK OUT TIME THE DAY OF DISCHARGE IS 11:00 AM.  ALL INPATIENT ROOMS ARE PRIVATE - WITH BATHROOM, TELEPHONE, TELEVISION AND WIFI INTERNET.  IF YOU ARE BEING DISCHARGED THE SAME DAY OF YOUR SURGERY--YOU CAN NOT DRIVE YOURSELF HOME--AND SHOULD NOT GO HOME ALONE BY TAXI OR BUS.  NO DRIVING OR OPERATING MACHINERY, OR MAKING LEGAL DECISIONS FOR 24 HOURS FOLLOWING ANESTHESIA / PAIN MEDICATIONS.  PLEASE MAKE ARRANGEMENTS FOR SOMEONE TO BE WITH YOU AT HOME THE FIRST 24 HOURS AFTER SURGERY. RESPONSIBLE DRIVER'S NAME / PHONE  PT'S WIFE WILL BE WITH HIM                                                     PLEASE BE AWARE THAT YOU MAY NEED ADDITIONAL BLOOD DRAWN DAY OF YOUR SURGERY  _______________________________________________________________________   [facility] - Preparing for Surgery Before surgery, you can play an important role.  Because skin is not sterile, your skin needs to be as free of germs as possible.  You can reduce the  number of germs on your skin by washing with CHG (chlorahexidine gluconate) soap before surgery.  CHG is an antiseptic cleaner which kills germs and bonds with the skin to continue killing germs even after washing. Please DO NOT use if you have an allergy to CHG or antibacterial soaps.  If your skin becomes reddened/irritated stop using the CHG and inform your nurse when you arrive at Short Stay. Do not shave (including legs and underarms) for at least 48 hours prior to the first CHG shower.  You may shave your face/neck. Please follow these instructions carefully:  1.  Shower with CHG Soap the night before surgery and the  morning of Surgery.  2.  If you choose to wash your hair, wash your hair first as usual with your  normal  shampoo.  3.  After you shampoo, rinse your hair and body thoroughly to remove the  shampoo.                           4.  Use CHG as you would any other liquid soap.  You can apply chg directly  to the skin and wash  Gently with a scrungie or clean washcloth.  5.  Apply the CHG Soap to your body ONLY FROM THE NECK DOWN.   Do not use on face/ open                           Wound or open sores. Avoid contact with eyes, ears mouth and genitals (private parts).                       Wash face,  Genitals (private parts) with your normal soap.             6.  Wash thoroughly, paying special attention to the area where your surgery  will be performed.  7.  Thoroughly rinse your body with warm water from the neck down.  8.  DO NOT shower/wash with your normal soap after using and rinsing off  the CHG Soap.                9.  Pat yourself dry with a clean towel.            10.  Wear clean pajamas.            11.  Place clean sheets on your bed the night of your first shower and do not  sleep with pets. Day of Surgery : Do not apply any lotions/deodorants the morning of surgery.  Please wear clean clothes to the hospital/surgery center.  FAILURE TO FOLLOW  THESE INSTRUCTIONS MAY RESULT IN THE CANCELLATION OF YOUR SURGERY PATIENT SIGNATURE_________________________________  NURSE SIGNATURE__________________________________  ________________________________________________________________________

## 2013-12-02 ENCOUNTER — Encounter (HOSPITAL_COMMUNITY)
Admission: RE | Disposition: A | Discharge status: Home / Self Care | Payer: Self-pay | Source: Ambulatory Visit | Attending: Surgery

## 2013-12-02 ENCOUNTER — Encounter (HOSPITAL_COMMUNITY): Payer: Self-pay | Admitting: *Deleted

## 2013-12-02 ENCOUNTER — Ambulatory Visit (HOSPITAL_COMMUNITY)
Admission: RE | Admit: 2013-12-02 | Discharge: 2013-12-02 | Disposition: A | Discharge status: Home / Self Care | Payer: Medicare Other | Source: Ambulatory Visit | Attending: Surgery | Admitting: Surgery

## 2013-12-02 ENCOUNTER — Ambulatory Visit (HOSPITAL_COMMUNITY): Payer: Medicare Other | Admitting: Anesthesiology

## 2013-12-02 DIAGNOSIS — K219 Gastro-esophageal reflux disease without esophagitis: Secondary | ICD-10-CM | POA: Diagnosis not present

## 2013-12-02 DIAGNOSIS — K601 Chronic anal fissure: Secondary | ICD-10-CM | POA: Insufficient documentation

## 2013-12-02 DIAGNOSIS — M1990 Unspecified osteoarthritis, unspecified site: Secondary | ICD-10-CM | POA: Insufficient documentation

## 2013-12-02 DIAGNOSIS — Z885 Allergy status to narcotic agent status: Secondary | ICD-10-CM | POA: Diagnosis not present

## 2013-12-02 DIAGNOSIS — K603 Anal fistula: Secondary | ICD-10-CM | POA: Insufficient documentation

## 2013-12-02 DIAGNOSIS — E780 Pure hypercholesterolemia: Secondary | ICD-10-CM | POA: Insufficient documentation

## 2013-12-02 DIAGNOSIS — G4730 Sleep apnea, unspecified: Secondary | ICD-10-CM | POA: Diagnosis not present

## 2013-12-02 DIAGNOSIS — Z86010 Personal history of colonic polyps: Secondary | ICD-10-CM | POA: Insufficient documentation

## 2013-12-02 DIAGNOSIS — I10 Essential (primary) hypertension: Secondary | ICD-10-CM | POA: Insufficient documentation

## 2013-12-02 DIAGNOSIS — K648 Other hemorrhoids: Secondary | ICD-10-CM | POA: Diagnosis not present

## 2013-12-02 DIAGNOSIS — Z87891 Personal history of nicotine dependence: Secondary | ICD-10-CM | POA: Diagnosis not present

## 2013-12-02 DIAGNOSIS — K644 Residual hemorrhoidal skin tags: Secondary | ICD-10-CM | POA: Insufficient documentation

## 2013-12-02 DIAGNOSIS — K6289 Other specified diseases of anus and rectum: Secondary | ICD-10-CM | POA: Diagnosis not present

## 2013-12-02 HISTORY — PX: TRANSANAL HEMORRHOIDAL DEARTERIALIZATION: SHX6136

## 2013-12-02 SURGERY — TRANSANAL HEMORRHOIDAL DEARTERIALIZATION
Anesthesia: General | Site: Rectum

## 2013-12-02 MED ORDER — TRAMADOL HCL 50 MG PO TABS: 50.0000 mg | ORAL_TABLET | Freq: Four times a day (QID) | ORAL | Status: DC | PRN

## 2013-12-02 MED ORDER — BUPIVACAINE-EPINEPHRINE (PF) 0.25% -1:200000 IJ SOLN
INTRAMUSCULAR | Status: AC
  Filled 2013-12-02: qty 60

## 2013-12-02 MED ORDER — SUCCINYLCHOLINE CHLORIDE 20 MG/ML IJ SOLN
INTRAMUSCULAR | Status: DC | PRN
  Administered 2013-12-02: 100 mg via INTRAVENOUS

## 2013-12-02 MED ORDER — FENTANYL CITRATE 0.05 MG/ML IJ SOLN
INTRAMUSCULAR | Status: DC | PRN
  Administered 2013-12-02 (×2): 50 ug via INTRAVENOUS

## 2013-12-02 MED ORDER — POLYETHYLENE GLYCOL 3350 17 G PO PACK
17.0000 g | PACK | Freq: Two times a day (BID) | ORAL | Status: DC | PRN
  Filled 2013-12-02: qty 1

## 2013-12-02 MED ORDER — SODIUM CHLORIDE 0.9 % IJ SOLN
INTRAMUSCULAR | Status: DC | PRN
  Administered 2013-12-02: 10 mL via INTRAVENOUS

## 2013-12-02 MED ORDER — SODIUM CHLORIDE 0.9 % IJ SOLN: 3.0000 mL | INTRAMUSCULAR | Status: DC | PRN

## 2013-12-02 MED ORDER — DEXAMETHASONE SODIUM PHOSPHATE 10 MG/ML IJ SOLN
INTRAMUSCULAR | Status: AC
  Filled 2013-12-02: qty 1

## 2013-12-02 MED ORDER — ACETAMINOPHEN 325 MG PO TABS: 325.0000 mg | ORAL_TABLET | Freq: Four times a day (QID) | ORAL | Status: DC | PRN

## 2013-12-02 MED ORDER — ONDANSETRON HCL 4 MG/2ML IJ SOLN: 4.0000 mg | Freq: Four times a day (QID) | INTRAMUSCULAR | Status: DC | PRN

## 2013-12-02 MED ORDER — DIPHENHYDRAMINE HCL 50 MG/ML IJ SOLN: 12.5000 mg | Freq: Four times a day (QID) | INTRAMUSCULAR | Status: DC | PRN

## 2013-12-02 MED ORDER — WITCH HAZEL-GLYCERIN EX PADS: 1.0000 "application " | MEDICATED_PAD | CUTANEOUS | Status: DC | PRN

## 2013-12-02 MED ORDER — METRONIDAZOLE IN NACL 5-0.79 MG/ML-% IV SOLN
INTRAVENOUS | Status: AC
  Filled 2013-12-02: qty 100

## 2013-12-02 MED ORDER — KETOROLAC TROMETHAMINE 30 MG/ML IJ SOLN
INTRAMUSCULAR | Status: DC | PRN
  Administered 2013-12-02: 30 mg via INTRAVENOUS

## 2013-12-02 MED ORDER — LACTATED RINGERS IV BOLUS (SEPSIS): 1000.0000 mL | Freq: Three times a day (TID) | INTRAVENOUS | Status: DC | PRN

## 2013-12-02 MED ORDER — ONDANSETRON HCL 4 MG/2ML IJ SOLN
INTRAMUSCULAR | Status: AC
  Filled 2013-12-02: qty 2

## 2013-12-02 MED ORDER — PHENYLEPHRINE HCL 10 MG/ML IJ SOLN
INTRAMUSCULAR | Status: DC | PRN
  Administered 2013-12-02 (×4): 80 ug via INTRAVENOUS

## 2013-12-02 MED ORDER — NEOSTIGMINE METHYLSULFATE 10 MG/10ML IV SOLN
INTRAVENOUS | Status: DC | PRN
Start: 2013-12-02 — End: 2013-12-02
  Administered 2013-12-02: 4 mg via INTRAVENOUS

## 2013-12-02 MED ORDER — ALUM & MAG HYDROXIDE-SIMETH 200-200-20 MG/5ML PO SUSP
30.0000 mL | Freq: Four times a day (QID) | ORAL | Status: DC | PRN
  Filled 2013-12-02: qty 30

## 2013-12-02 MED ORDER — CEFAZOLIN SODIUM-DEXTROSE 2-3 GM-% IV SOLR
INTRAVENOUS | Status: AC
Start: 2013-12-02 — End: 2013-12-02
  Filled 2013-12-02: qty 50

## 2013-12-02 MED ORDER — LACTATED RINGERS IV SOLN
INTRAVENOUS | Status: DC | PRN
  Administered 2013-12-02 (×2): via INTRAVENOUS

## 2013-12-02 MED ORDER — CEFAZOLIN SODIUM-DEXTROSE 2-3 GM-% IV SOLR
2.0000 g | INTRAVENOUS | Status: AC
Start: 2013-12-02 — End: 2013-12-02
  Administered 2013-12-02: 2 g via INTRAVENOUS

## 2013-12-02 MED ORDER — HYDROMORPHONE HCL 1 MG/ML IJ SOLN
INTRAMUSCULAR | Status: AC
  Filled 2013-12-02: qty 1

## 2013-12-02 MED ORDER — ONDANSETRON HCL 4 MG/2ML IJ SOLN
INTRAMUSCULAR | Status: DC | PRN
  Administered 2013-12-02: 4 mg via INTRAVENOUS

## 2013-12-02 MED ORDER — PHENYLEPHRINE HCL 10 MG/ML IJ SOLN
INTRAMUSCULAR | Status: AC
  Filled 2013-12-02: qty 1

## 2013-12-02 MED ORDER — HYDROCORTISONE ACE-PRAMOXINE 2.5-1 % RE CREA
1.0000 "application " | TOPICAL_CREAM | Freq: Four times a day (QID) | RECTAL | Status: DC | PRN
  Filled 2013-12-02: qty 30

## 2013-12-02 MED ORDER — HYDROMORPHONE HCL 1 MG/ML IJ SOLN: 0.5000 mg | INTRAMUSCULAR | Status: DC | PRN

## 2013-12-02 MED ORDER — BUPIVACAINE LIPOSOME 1.3 % IJ SUSP
20.0000 mL | INTRAMUSCULAR | Status: DC
  Filled 2013-12-02: qty 20

## 2013-12-02 MED ORDER — METRONIDAZOLE IN NACL 5-0.79 MG/ML-% IV SOLN
500.0000 mg | INTRAVENOUS | Status: AC
  Administered 2013-12-02: 500 mg via INTRAVENOUS
  Filled 2013-12-02: qty 100

## 2013-12-02 MED ORDER — LIP MEDEX EX OINT: 1.0000 "application " | TOPICAL_OINTMENT | Freq: Two times a day (BID) | CUTANEOUS | Status: DC

## 2013-12-02 MED ORDER — LIDOCAINE HCL (CARDIAC) 20 MG/ML IV SOLN
INTRAVENOUS | Status: AC
  Filled 2013-12-02: qty 5

## 2013-12-02 MED ORDER — SODIUM CHLORIDE 0.9 % IV SOLN: 250.0000 mL | INTRAVENOUS | Status: DC | PRN

## 2013-12-02 MED ORDER — BUPIVACAINE LIPOSOME 1.3 % IJ SUSP
INTRAMUSCULAR | Status: DC | PRN
  Administered 2013-12-02: 20 mL

## 2013-12-02 MED ORDER — PROPOFOL 10 MG/ML IV BOLUS
INTRAVENOUS | Status: DC | PRN
  Administered 2013-12-02: 200 mg via INTRAVENOUS

## 2013-12-02 MED ORDER — LIDOCAINE HCL (CARDIAC) 20 MG/ML IV SOLN
INTRAVENOUS | Status: DC | PRN
  Administered 2013-12-02: 100 mg via INTRAVENOUS

## 2013-12-02 MED ORDER — 0.9 % SODIUM CHLORIDE (POUR BTL) OPTIME
TOPICAL | Status: DC | PRN
  Administered 2013-12-02: 1000 mL

## 2013-12-02 MED ORDER — MEPERIDINE HCL 50 MG/ML IJ SOLN: 6.2500 mg | INTRAMUSCULAR | Status: DC | PRN

## 2013-12-02 MED ORDER — PROPOFOL 10 MG/ML IV BOLUS
INTRAVENOUS | Status: AC
  Filled 2013-12-02: qty 20

## 2013-12-02 MED ORDER — GLYCOPYRROLATE 0.2 MG/ML IJ SOLN
INTRAMUSCULAR | Status: AC
  Filled 2013-12-02: qty 3

## 2013-12-02 MED ORDER — PHENOL 1.4 % MT LIQD: 2.0000 | OROMUCOSAL | Status: DC | PRN

## 2013-12-02 MED ORDER — PROMETHAZINE HCL 25 MG/ML IJ SOLN: 6.2500 mg | INTRAMUSCULAR | Status: DC | PRN

## 2013-12-02 MED ORDER — OXYCODONE HCL 5 MG PO TABS
5.0000 mg | ORAL_TABLET | ORAL | Status: DC | PRN
  Administered 2013-12-02: 10 mg via ORAL
  Filled 2013-12-02: qty 2

## 2013-12-02 MED ORDER — PHENYLEPHRINE HCL 10 MG/ML IJ SOLN
10.0000 mg | INTRAMUSCULAR | Status: DC | PRN
  Administered 2013-12-02: 20 ug/min via INTRAVENOUS

## 2013-12-02 MED ORDER — OXYCODONE HCL 5 MG PO TABS: 5.0000 mg | ORAL_TABLET | ORAL | Status: DC | PRN

## 2013-12-02 MED ORDER — ROCURONIUM BROMIDE 100 MG/10ML IV SOLN
INTRAVENOUS | Status: DC | PRN
  Administered 2013-12-02: 20 mg via INTRAVENOUS

## 2013-12-02 MED ORDER — PHENYLEPHRINE 40 MCG/ML (10ML) SYRINGE FOR IV PUSH (FOR BLOOD PRESSURE SUPPORT)
PREFILLED_SYRINGE | INTRAVENOUS | Status: AC
  Filled 2013-12-02: qty 10

## 2013-12-02 MED ORDER — MAGIC MOUTHWASH
15.0000 mL | Freq: Four times a day (QID) | ORAL | Status: DC | PRN
  Filled 2013-12-02: qty 15

## 2013-12-02 MED ORDER — IBUPROFEN 600 MG PO TABS
600.0000 mg | ORAL_TABLET | Freq: Four times a day (QID) | ORAL | Status: DC | PRN
  Filled 2013-12-02: qty 1

## 2013-12-02 MED ORDER — EPHEDRINE SULFATE 50 MG/ML IJ SOLN
INTRAMUSCULAR | Status: DC | PRN
  Administered 2013-12-02 (×2): 5 mg via INTRAVENOUS

## 2013-12-02 MED ORDER — SODIUM CHLORIDE 0.9 % IJ SOLN
INTRAMUSCULAR | Status: AC
  Filled 2013-12-02: qty 50

## 2013-12-02 MED ORDER — GLYCOPYRROLATE 0.2 MG/ML IJ SOLN
INTRAMUSCULAR | Status: DC | PRN
  Administered 2013-12-02: 0.6 mg via INTRAVENOUS

## 2013-12-02 MED ORDER — ONDANSETRON 8 MG/NS 50 ML IVPB
8.0000 mg | Freq: Four times a day (QID) | INTRAVENOUS | Status: DC | PRN
  Filled 2013-12-02: qty 8

## 2013-12-02 MED ORDER — DEXAMETHASONE SODIUM PHOSPHATE 10 MG/ML IJ SOLN
INTRAMUSCULAR | Status: DC | PRN
  Administered 2013-12-02: 10 mg via INTRAVENOUS

## 2013-12-02 MED ORDER — KETOROLAC TROMETHAMINE 30 MG/ML IJ SOLN
INTRAMUSCULAR | Status: AC
  Filled 2013-12-02: qty 1

## 2013-12-02 MED ORDER — FENTANYL CITRATE 0.05 MG/ML IJ SOLN
INTRAMUSCULAR | Status: AC
  Filled 2013-12-02: qty 2

## 2013-12-02 MED ORDER — SODIUM CHLORIDE 0.9 % IJ SOLN: 3.0000 mL | Freq: Two times a day (BID) | INTRAMUSCULAR | Status: DC

## 2013-12-02 MED ORDER — HYDROMORPHONE HCL 1 MG/ML IJ SOLN
0.2500 mg | INTRAMUSCULAR | Status: DC | PRN
  Administered 2013-12-02: 0.25 mg via INTRAVENOUS

## 2013-12-02 MED ORDER — NEOSTIGMINE METHYLSULFATE 10 MG/10ML IV SOLN
INTRAVENOUS | Status: AC
  Filled 2013-12-02: qty 1

## 2013-12-02 MED ORDER — MENTHOL 3 MG MT LOZG: 1.0000 | LOZENGE | OROMUCOSAL | Status: DC | PRN

## 2013-12-02 SURGICAL SUPPLY — 37 items
BLADE HEX COATED 2.75 (ELECTRODE) ×4 IMPLANT
BLADE SURG 15 STRL LF DISP TIS (BLADE) ×1 IMPLANT
BLADE SURG 15 STRL SS (BLADE)
BRIEF STRETCH FOR OB PAD LRG (UNDERPADS AND DIAPERS) ×4 IMPLANT
CANISTER SUCTION 2500CC (MISCELLANEOUS) ×1 IMPLANT
CLOSURE WOUND 1/2 X4 (GAUZE/BANDAGES/DRESSINGS) ×1
DECANTER SPIKE VIAL GLASS SM (MISCELLANEOUS) ×4 IMPLANT
DRAPE LAPAROTOMY T 102X78X121 (DRAPES) ×4 IMPLANT
DRAPE LAPAROTOMY TRNSV 102X78 (DRAPE) ×4 IMPLANT
DRSG PAD ABDOMINAL 8X10 ST (GAUZE/BANDAGES/DRESSINGS) ×3 IMPLANT
ELECT REM PT RETURN 9FT ADLT (ELECTROSURGICAL) ×4
ELECTRODE REM PT RTRN 9FT ADLT (ELECTROSURGICAL) ×2 IMPLANT
GAUZE SPONGE 4X4 12PLY STRL (GAUZE/BANDAGES/DRESSINGS) ×4 IMPLANT
GAUZE SPONGE 4X4 16PLY XRAY LF (GAUZE/BANDAGES/DRESSINGS) ×4 IMPLANT
GLOVE BIOGEL PI IND STRL 8 (GLOVE) ×2 IMPLANT
GLOVE BIOGEL PI INDICATOR 8 (GLOVE) ×2
GLOVE ECLIPSE 8.0 STRL XLNG CF (GLOVE) ×4 IMPLANT
GLOVE INDICATOR 8.0 STRL GRN (GLOVE) ×4 IMPLANT
GOWN STRL REUS W/TWL XL LVL3 (GOWN DISPOSABLE) ×8 IMPLANT
HEMOSTAT SURGICEL 4X8 (HEMOSTASIS) ×1 IMPLANT
KIT BASIN OR (CUSTOM PROCEDURE TRAY) ×4 IMPLANT
KIT SLIDE ONE PROLAPS HEMORR (KITS) ×4 IMPLANT
LUBRICANT JELLY K Y 4OZ (MISCELLANEOUS) ×4 IMPLANT
NEEDLE HYPO 22GX1.5 SAFETY (NEEDLE) ×4 IMPLANT
NS IRRIG 1000ML POUR BTL (IV SOLUTION) ×4 IMPLANT
PACK BASIC VI WITH GOWN DISP (CUSTOM PROCEDURE TRAY) ×4 IMPLANT
PAD ABD 8X10 STRL (GAUZE/BANDAGES/DRESSINGS) ×3 IMPLANT
PENCIL BUTTON HOLSTER BLD 10FT (ELECTRODE) ×4 IMPLANT
STRIP CLOSURE SKIN 1/2X4 (GAUZE/BANDAGES/DRESSINGS) ×2 IMPLANT
SUT CHROMIC 3 0 SH 27 (SUTURE) IMPLANT
SUT MNCRL AB 4-0 PS2 18 (SUTURE) ×3 IMPLANT
SUT VIC AB 2-0 UR6 27 (SUTURE) IMPLANT
SYR 20CC LL (SYRINGE) ×4 IMPLANT
TOWEL OR 17X26 10 PK STRL BLUE (TOWEL DISPOSABLE) ×4 IMPLANT
TOWEL OR NON WOVEN STRL DISP B (DISPOSABLE) ×4 IMPLANT
YANKAUER SUCT BULB TIP 10FT TU (MISCELLANEOUS) ×4 IMPLANT
YANKAUER SUCT BULB TIP NO VENT (SUCTIONS) ×4 IMPLANT

## 2013-12-02 NOTE — Progress Notes (Addendum)
Wrong entry

## 2013-12-02 NOTE — Anesthesia Postprocedure Evaluation (Signed)
Anesthesia Post Note  Patient: Christopher Acevedo  Procedure(s) Performed: Procedure(s) (LRB): TRANSANAL HEMORRHOIDAL DEARTERIALIZATION AND LIGATION/PEXY, EXTERNAL HEMORRHOIDECTOMY, REMOVAL OF GLUTEAL MASS (N/A)  Anesthesia type: General  Patient location: PACU  Post pain: Pain level controlled  Post assessment: Post-op Vital signs reviewed  Last Vitals: BP 129/55 mmHg  Pulse 62  Temp(Src) 36.4 C (Oral)  Resp 16  Ht 6' (1.829 m)  Wt 255 lb (115.667 kg)  BMI 34.58 kg/m2  SpO2 94%  Post vital signs: Reviewed  Level of consciousness: sedated  Complications: No apparent anesthesia complications

## 2013-12-02 NOTE — Transfer of Care (Signed)
Immediate Anesthesia Transfer of Care Note  Patient: Christopher Acevedo  Procedure(s) Performed: Procedure(s): TRANSANAL HEMORRHOIDAL DEARTERIALIZATION AND LIGATION/PEXY, EXTERNAL HEMORRHOIDECTOMY, REMOVAL OF GLUTEAL MASS (N/A)  Patient Location: PACU  Anesthesia Type:General  Level of Consciousness: sedated  Airway & Oxygen Therapy: Patient Spontanous Breathing and Patient connected to face mask oxygen  Post-op Assessment: Report given to PACU RN and Post -op Vital signs reviewed and stable  Post vital signs: Reviewed and stable  Complications: No apparent anesthesia complications

## 2013-12-02 NOTE — Progress Notes (Signed)
Clarified foley catheter order with Dr Johney Maine. Patient bladder scanned for 678 ml. Dr Johney Maine stated to leave foley catheter in with the first catheterization and send patient home. Patient is to remove catheter in the morning. To notify Dr Johney Maine if unable to void within six hours after catheter is removed.

## 2013-12-02 NOTE — H&P (View-Only) (Signed)
[place] JEuan Wandler 11/12/2013 9:35 AM Location: [facility] Patient #: [id number] DOB: 1940-04-18 Married / Language: Cleophus Molt / Race: White Male  History of Present Illness Adin Hector MD; 11/12/2013 3:58 PM) Patient words: new Pt. hem consult.  The patient is a 73 year old male who presents with a complaint of anal problems. Pleasant obese male that is struggled with hemorrhoid problems for many years. Feels a hemorrhoid all the time. Occasionally bleeds. Often has leaking. Hard to keep the area clean. Uses but wipes. Can give intermittent rashes. History of diverticulosis by colonoscopy followed by Dr. Paulita Fujita. Normally has some constipation with bowel movements every other day. Usually treat hemorrhoids with cortisone ointments. That helps. Had a tense flare a month ago. Saw Dr Jonathon Jordan, his primary care physician. Surgical consultation recommended. He is feeling better now but still struggles. Patient also notes he has a lump on his inner thigh and buttock that is irritating and bothersome. Close to the anus. Wonders if that can be removed. He is rather active. Goes golfing several times a week. No history of cardiac reported problems. No history of Crohn's or ulcerative colitis.   Other Problems Briant Cedar, CMA; 11/12/2013 9:35 AM) Arthritis Back Pain Diverticulosis Hemorrhoids High blood pressure Hypercholesterolemia Sleep Apnea  Past Surgical History Briant Cedar, CMA; 11/12/2013 9:35 AM) Colon Polyp Removal - Colonoscopy Colon Polyp Removal - Open Shoulder Surgery Right.  Diagnostic Studies History Briant Cedar, [place]; 11/12/2013 9:35 AM) Colonoscopy 1-5 years ago  Allergies Briant Cedar, [place]; 11/12/2013 9:39 AM) Codeine/Codeine Derivatives  Medication History Briant Cedar, CMA; 11/12/2013 9:42 AM) Erythromycin (5MG /GM Ointment, Ophthalmic) Active. Norvasc (10MG  Tablet, Oral) Active. Omeprazole  (20MG  Capsule DR, Oral) Active. Simvastatin (20MG  Tablet, Oral) Active. TraMADol HCl (50MG  Tablet Disperse, Oral) Active.  Social History Briant Cedar, [place]; 11/12/2013 9:35 AM) Alcohol use Moderate alcohol use. Caffeine use Carbonated beverages, Coffee, Tea. No drug use Tobacco use Former smoker.  Family History Briant Cedar, [place]; 11/12/2013 9:35 AM) Alcohol Abuse Brother, Father. Heart Disease Mother. Heart disease in male family member before age 11 Hypertension Brother. Ischemic Bowel Disease Brother, Sister.  Review of Systems Briant Cedar CMA; 11/12/2013 9:35 AM) General Present- Fatigue. Not Present- Appetite Loss, Chills, Fever, Night Sweats, Weight Gain and Weight Loss. Skin Present- Dryness. Not Present- Change in Wart/Mole, Hives, Jaundice, New Lesions, Non-Healing Wounds, Rash and Ulcer. HEENT Present- Hearing Loss, Seasonal Allergies and Wears glasses/contact lenses. Not Present- Earache, Hoarseness, Nose Bleed, Oral Ulcers, Ringing in the Ears, Sinus Pain, Sore Throat, Visual Disturbances and Yellow Eyes. Respiratory Present- Snoring. Not Present- Bloody sputum, Chronic Cough, Difficulty Breathing and Wheezing. Breast Not Present- Breast Mass, Breast Pain, Nipple Discharge and Skin Changes. Gastrointestinal Present- Abdominal Pain, Constipation, Hemorrhoids and Rectal Pain. Not Present- Bloating, Bloody Stool, Change in Bowel Habits, Chronic diarrhea, Difficulty Swallowing, Excessive gas, Gets full quickly at meals, Indigestion, Nausea and Vomiting. Male Genitourinary Present- Frequency. Not Present- Blood in Urine, Change in Urinary Stream, Impotence, Nocturia, Painful Urination, Urgency and Urine Leakage. Musculoskeletal Present- Back Pain, Joint Stiffness and Muscle Pain. Not Present- Joint Pain, Muscle Weakness and Swelling of Extremities. Psychiatric Not Present- Anxiety, Bipolar, Change in Sleep Pattern, Depression, Fearful and Frequent  crying. Endocrine Not Present- Cold Intolerance, Excessive Hunger, Hair Changes, Heat Intolerance, Hot flashes and New Diabetes. Hematology Not Present- Easy Bruising, Excessive bleeding, Gland problems, HIV and Persistent Infections.   Vitals Briant Cedar CMA; 11/12/2013 9:37 AM) 11/12/2013 9:37 AM Weight: 256.13 lb Height: 71.5in Body Surface Area: 2.42  m Body Mass Index: 35.22 kg/m Temp.: 97.80F  Pulse: 64 (Regular)  BP: 128/80 (Sitting, Left Arm, Standard)    Physical Exam Adin Hector MD; 11/12/2013 10:01 AM) General Mental Status-Alert. General Appearance-Not in acute distress, Not Sickly. Orientation-Oriented X3. Hydration-Well hydrated. Voice-Normal.  Integumentary Global Assessment Upon inspection and palpation of skin surfaces of the - Axillae: non-tender, no inflammation or ulceration, no drainage. and Distribution of scalp and body hair is normal. General Characteristics Temperature - normal warmth is noted.  Head and Neck Head-normocephalic, atraumatic with no lesions or palpable masses. Face Global Assessment - atraumatic, no absence of expression. Neck Global Assessment - no abnormal movements, no bruit auscultated on the right, no bruit auscultated on the left, no decreased range of motion, non-tender. Trachea-midline. Thyroid Gland Characteristics - non-tender.  Eye Eyeball - Left-Extraocular movements intact, No Nystagmus. Eyeball - Right-Extraocular movements intact, No Nystagmus. Cornea - Left-No Hazy. Cornea - Right-No Hazy. Sclera/Conjunctiva - Left-No scleral icterus, No Discharge. Sclera/Conjunctiva - Right-No scleral icterus, No Discharge. Pupil - Left-Direct reaction to light normal. Pupil - Right-Direct reaction to light normal.  ENMT Ears Pinna - Left - no drainage observed, no generalized tenderness observed. Right - no drainage observed, no generalized tenderness observed. Nose and  Sinuses External Inspection of the Nose - no destructive lesion observed. Inspection of the nares - Left - quiet respiration. Right - quiet respiration. Mouth and Throat Lips - Upper Lip - no fissures observed, no pallor noted. Lower Lip - no fissures observed, no pallor noted. Nasopharynx - no discharge present. Oral Cavity/Oropharynx - Tongue - no dryness observed. Oral Mucosa - no cyanosis observed. Hypopharynx - no evidence of airway distress observed.  Chest and Lung Exam Inspection Movements - Normal and Symmetrical. Accessory muscles - No use of accessory muscles in breathing. Palpation Palpation of the chest reveals - Non-tender. Auscultation Breath sounds - Normal and Clear.  Cardiovascular Auscultation Rhythm - Regular. Murmurs & Other Heart Sounds - Auscultation of the heart reveals - No Murmurs and No Systolic Clicks.  Abdomen Inspection Inspection of the abdomen reveals - No Visible peristalsis and No Abnormal pulsations. Umbilicus - No Bleeding, No Urine drainage. Palpation/Percussion Palpation and Percussion of the abdomen reveal - Soft, Non Tender, No Rebound tenderness, No Rigidity (guarding) and No Cutaneous hyperesthesia.  Male Genitourinary Sexual Maturity Tanner 5 - Adult hair pattern and Adult penile size and shape.  Rectal Note: Pedunculated skin tag on the right gluteal region 2 x 2 centimeters. Not inflamed.  Hygiene fair around the anus. Chronic posterior midline external hemorrhoid sentinel tag. Chronic anal fissure. Mildly sensitive but not severe. Normal sphincter tone. Anoscopy done. Moderately enlarged right posterior , right anterior > L lateral internal hemorrhoid piles. No fistula. No condyloma. Prostate mildly enlarged but soft.   Peripheral Vascular Upper Extremity Inspection - Left - No Cyanotic nailbeds, Not Ischemic. Right - No Cyanotic nailbeds, Not Ischemic.  Neurologic Neurologic evaluation reveals -normal attention span and ability  to concentrate, able to name objects and repeat phrases. Appropriate fund of knowledge , normal sensation and normal coordination. Mental Status Affect - not angry, not paranoid. Cranial Nerves-Normal Bilaterally. Gait-Normal.  Neuropsychiatric Mental status exam performed with findings of-able to articulate well with normal speech/language, rate, volume and coherence, thought content normal with ability to perform basic computations and apply abstract reasoning and no evidence of hallucinations, delusions, obsessions or homicidal/suicidal ideation.  Musculoskeletal Global Assessment Spine, Ribs and Pelvis - no instability, subluxation or laxity. Right Upper Extremity -  no instability, subluxation or laxity.  Lymphatic Head & Neck  General Head & Neck Lymphatics: Bilateral - Description - No Localized lymphadenopathy. Axillary  General Axillary Region: Bilateral - Description - No Localized lymphadenopathy. Femoral & Inguinal  Generalized Femoral & Inguinal Lymphatics: Left - Description - No Localized lymphadenopathy. Right - Description - No Localized lymphadenopathy.    Assessment & Plan Adin Hector MD; 11/12/2013 10:16 AM) CHRONIC POSTERIOR ANAL FISSURE (565.0  K60.1) Impression: I believe that the fissure is created a sentinel tag that allows leaking and pruritis ani. I think he would benefit from examination under anesthesia to remove the sentinel tag with possible sphincterotomy. r/o an abscess/fistula.  The anatomy & physiology of the anorectal region was discussed. The pathophysiology of anal fissure and differential diagnosis was discussed. Natural history progression was discussed. I stressed the importance of a bowel regimen to have daily soft bowel movements to minimize progression of disease.  The patient's condition is not adequately controlled. Non-operative treatment has not healed the fissure. Therefore, I recommended examination under anesthesia for  better examination to confirm the diagnosis and treat by lateral internal sphincterotomy to relax the spasm better & allow the fissure to heal. Technique, benefits, alternatives were discussed. I noted a good likelihood this will help address the problem. Risks such as bleeding, pain, incontinence, recurrence, heart attack, death, and other risks were discussed.  Educational handouts further explaining the pathology, treatment options, and bowel regimen were given as well. The patient expressed understanding & wishes to proceed with surgery. Current Plans  Pt Education - [facility] Good Bowel Health (Kayse Puccini) Pt Education - [facility] Rectal Surgery HCI (Erin Uecker): discussed with patient and provided information. ANOSCOPY, DIAGNOSTIC (46600) PRURITUS ANI (698.0  L29.0) Impression: Most likely side effect from chronic leaking from anal fissure and sentinel pack. Hopefully will improve after surgery INTERNAL BLEEDING HEMORRHOIDS (455.2  K64.8) Impression: I think he would benefit from concurrent hemorrhoidal ligation. Would plan THD approach to minimize scarring. He agrees  The anatomy & physiology of the anorectal region was discussed. The pathophysiology of hemorrhoids and differential diagnosis was discussed. Natural history risks without surgery was discussed. I stressed the importance of a bowel regimen to have daily soft bowel movements to minimize progression of disease. Interventions such as sclerotherapy & banding were discussed.  The patient's symptoms are not adequately controlled by medicines and other non-operative treatments. I feel the risks & problems of no surgery outweigh the operative risks; therefore, I recommended surgery to treat the hemorrhoids by ligation, pexy, and possible resection.  Risks such as bleeding, infection, urinary difficulties, need for further treatment, heart attack, death, and other risks were discussed. I noted a good likelihood this will help address the problem. Goals of  post-operative recovery were discussed as well. Possibility that this will not correct all symptoms was explained. Post-operative pain, bleeding, constipation, and other problems after surgery were discussed. We will work to minimize complications. Educational handouts further explaining the pathology, treatment options, and bowel regimen were given as well. Questions were answered. The patient expresses understanding & wishes to proceed with surgery. 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 He wishes to proceed  The pathophysiology of skin & subcutaneous masses was discussed. Natural history risks without surgery were discussed. I recommended surgery to remove the mass. I explained the technique of removal with  use of local anesthesia & possible need for more aggressive sedation/anesthesia for patient comfort.  Risks such as bleeding, infection, heart attack, death, and other risks were discussed. I noted a good likelihood this will help address the problem. Possibility that this will not correct all symptoms was explained. Possibility of regrowth/recurrence of the mass was discussed. We will work to minimize complications. Questions were answered. The patient expresses understanding & wishes to proceed with surgery. Current Plans Schedule for Surgery   Signed by Adin Hector, MD (11/12/2013 3:58 PM)

## 2013-12-02 NOTE — Op Note (Signed)
12/02/2013  11:29 AM  PATIENT:  Christopher Acevedo  73 y.o. male  Patient Care Team: Lilian Coma, MD as PCP - General (Family Medicine)  PRE-OPERATIVE DIAGNOSIS:    Chronic anal fissure with sentinel external hemorrhoid Internal hemorrhoids with pain/bleeding Skin mass - right posterior buttock  POST-OPERATIVE DIAGNOSIS:    Chronic anal fissure with sentinel external hemorrhoid Chronic anal fistula Internal hemorrhoids with pain/bleeding Skin mass - right posterior buttock  PROCEDURE:  Procedure(s): TRANSANAL HEMORRHOIDAL DEARTERIALIZATION AND LIGATION/PEXY EXTERNAL HEMORRHOIDECTOMY (sentinel tag posterior midline) EXCISION OF ANAL FISTULA REMOVAL OF GLUTEAL MASS ANAL DILATION  SURGEON:  Surgeon(s): Michael Boston, MD  ASSISTANT: none   ANESTHESIA:     Anorectal block with liposomal bupivicaine local and general  EBL:  Total I/O In: 1000 [I.V.:1000] Out: -   Delay start of Pharmacological VTE agent (>24hrs) due to surgical blood loss or risk of bleeding:  no  DRAINS: none   SPECIMEN:  Source of Specimen:  Posterior midline external hemorrhoid sentinel tag with chronic anal fissure and probable chronic anal fistula.  DISPOSITION OF SPECIMEN:  PATHOLOGY  COUNTS:  YES  PLAN OF CARE: Discharge to home after PACU  PATIENT DISPOSITION:  PACU - hemodynamically stable.  INDICATION:  Pleasant morbidly obese male with chronic anal pain and itching.  Pruritis ani.  Posterior midline anal canal wound suspicious for chronic anal fissure with classic sentinel tag.  Also has annoying skin mass on right posterior buttock.  It is enlarged.  Difficult to sit.  Evidence of some internal hemorrhoids.    I recommended examination under anesthesia with removal of external and probable internal posterior midline tag/hemorhhoid piles.  Ligation/pexy of any persistent internal hemorrhoids.  Possible sphincterotomy for chronic anal fissure.  Removal of right posterior gluteal  mass:  The anatomy & physiology of the anorectal region was discussed.  The pathophysiology of hemorrhoids and differential diagnosis was discussed.  Natural history risks without surgery was discussed.   I stressed the importance of a bowel regimen to have daily soft bowel movements to minimize progression of disease.  Interventions such as sclerotherapy & banding were discussed.  The patient's symptoms are not adequately controlled by medicines and other non-operative treatments.  I feel the risks & problems of no surgery outweigh the operative risks; therefore, I recommended surgery to treat the hemorrhoids by ligation, pexy, and possible resection.  Risks such as bleeding, infection, urinary difficulties, need for further treatment, heart attack, death, and other risks were discussed.   I noted a good likelihood this will help address the problem.  Goals of post-operative recovery were discussed as well.  Possibility that this will not correct all symptoms was explained.  Post-operative pain, bleeding, constipation, and other problems after surgery were discussed.  We will work to minimize complications.   Educational handouts further explaining the pathology, treatment options, and bowel regimen were given as well.  Questions were answered.  The patient expresses understanding & wishes to proceed with surgery.   OR FINDINGS:  Posterior midline external hemorrhoid consistent with sentinel tag.  Posterior midline anal canal wound suspicious for fissure but no increased sphincter tone.  Small nodular area just outside of the external tag with fibrous tunnel suspicious for chronic anal fistula going to the fissure.  Moderate internal hemorrhoidal disease.  No evidence of condyloma.  No mass or tumor or polyps.  DESCRIPTION:   Informed consent was confirmed. Patient underwent general anesthesia without difficulty. Patient was placed into prone positioning.  The perianal  region was prepped and draped  in sterile fashion. Surgical time-out confirmed our plan.  I began worse removal of the right posterior skin mass on the buttock.  Rather fungated but most consistent with a atypical acrochordon tag than a condyloma.  3 x 2 cm size.  Removed at the base of the stalk.  I closed the wound radially / obliquely with Monocryl stitch.  Sterile dressings applied.  Sterile drape placed over it.  I then focused to the perianal region.  I could see the large obvious posterior midline external hemorrhoidal tag consistent with a sentinel tag.  Could see a fissure exposing the posterior midline sphincters in the anal canal.  However there was not a tight sphincter.  He easily could allow a finger, then small and moderately larger anoscopes.  He did have enlarged internal hemorrhoidal piles as well that were partially prolapsing on the right posterior and left posterior aspects.  I could also note a 3 mm fibrotic nodular polyp in the skin just external to the sentinel tag on the right posterior midline aspect.   I did excise this nodular area with the large external hemorrhoidal sentinel tag longitudinally into the anal canal & rectum.  Encountered a fibrous tubular tunnel from the nodular area suspicious for chronic fissure.  It connected to the posterior midline fissure.  Excised edges around the posterior fissure.  I continued to excising the enlarged internal hemorrhoidal pile.  I assured hemostasis.  Because it was not certain whether the wound was from a fistula right fissure and the fact that the anal sphincters dilated rather easily and tolerated a larger anoscope, I held off on formal sphincterotomy at this time. I closed the posterior midline rectal and anal canal wound with a 2-0 Vicryl running suture out to the skin.  Tied that down for a pexy as well with the large anoscope in place to avoid stricture.  I left the distal 5 mm open to allow drainage..  I switched over to the [facility] fiberoptically lit Doppler  anocope.   Using the Doppler on the tip of the [place] anoscope, I identified the arterial hemorrhoidal vessels coming in in the classic hexagonal anatomical pattern but ignoring the posterior lateral piles since that had been excised earlier.  I proceeded to ligate the hemorrhoidal arteries. I used a 2-0 Vicryl suture on a UR-6 needle in a figure-of-eight fashion over the signal around 6 cm proximal to the anal verge. I then ran that stitch longitudinally more distally to the white line of [place]. I then tied that stitch down to cause a hemorrhoidopexy. I did that for the lateral and anteriolateral locations (right lateral/anterior, left lateral/anterior).    I redid Doppler anoscopy. . Signals went away.  At completion of this, all hemorrhoids were reduced into the rectum. There is no more prolapse. External anatomy looked normal.  I repeated anoscopy and examination.  Hemostasis was good.  Patient is being extubated go to recovery room.  I had discussed postop care in detail with the patient in the preop holding area.  Instructions are written.  I am about to discuss the patient's status to the family.     Adin Hector, M.D., F.A.C.S. Gastrointestinal and Minimally Invasive Surgery [facility] [address] [place], [place] [address] [phone] Main / Paging

## 2013-12-02 NOTE — Anesthesia Preprocedure Evaluation (Signed)
Anesthesia Evaluation  Patient identified by MRN, date of birth, ID band Patient awake    Reviewed: Allergy & Precautions, H&P , NPO status , Patient's Chart, lab work & pertinent test results  Airway Mallampati: II  TM Distance: >3 FB Neck ROM: Full    Dental no notable dental hx.    Pulmonary sleep apnea , former smoker,  breath sounds clear to auscultation  Pulmonary exam normal       Cardiovascular hypertension, Pt. on medications Rhythm:Regular Rate:Normal     Neuro/Psych negative neurological ROS  negative psych ROS   GI/Hepatic Neg liver ROS, GERD-  ,  Endo/Other  negative endocrine ROS  Renal/GU negative Renal ROS     Musculoskeletal  (+) Arthritis -,   Abdominal   Peds  Hematology negative hematology ROS (+)   Anesthesia Other Findings   Reproductive/Obstetrics negative OB ROS                             Anesthesia Physical Anesthesia Plan  ASA: III  Anesthesia Plan: General   Post-op Pain Management:    Induction: Intravenous  Airway Management Planned: LMA and Oral ETT  Additional Equipment: None  Intra-op Plan:   Post-operative Plan: Extubation in OR  Informed Consent: I have reviewed the patients History and Physical, chart, labs and discussed the procedure including the risks, benefits and alternatives for the proposed anesthesia with the patient or authorized representative who has indicated his/her understanding and acceptance.   Dental advisory given  Plan Discussed with: CRNA  Anesthesia Plan Comments:         Anesthesia Quick Evaluation

## 2013-12-02 NOTE — Discharge Instructions (Signed)
ANORECTAL SURGERY:  POST OPERATIVE INSTRUCTIONS  1. Take your usually prescribed home medications unless otherwise directed. 2. DIET: Follow a light bland diet the first 24 hours after arrival home, such as soup, liquids, crackers, etc.  Be sure to include lots of fluids daily.  Avoid fast food or heavy meals as your are more likely to get nauseated.  Eat a low fat the next few days after surgery.   3. PAIN CONTROL: a. Pain is best controlled by a usual combination of three different methods TOGETHER: i. Ice/Heat ii. Over the counter pain medication iii. Prescription pain medication b. Most patients will experience some swelling and discomfort in the anus/rectal area. and incisions.  Ice packs or heat (30-60 minutes up to 6 times a day) will help. Use ice for the first few days to help decrease swelling and bruising, then switch to heat such as warm towels, sitz baths, warm baths, etc to help relax tight/sore spots and speed recovery.  Some people prefer to use ice alone, heat alone, alternating between ice & heat.  Experiment to what works for you.  Swelling and bruising can take several weeks to resolve.   c. It is helpful to take an over-the-counter pain medication regularly for the first few weeks.  Choose one of the following that works best for you: i. Naproxen (Aleve, etc)  Two 220mg  tabs twice a day ii. Ibuprofen (Advil, etc) Three 200mg  tabs four times a day (every meal & bedtime) iii. Acetaminophen (Tylenol, etc) 500-650mg  four times a day (every meal & bedtime) d. A  prescription for pain medication (such as oxycodone, hydrocodone, etc) should be given to you upon discharge.  Take your pain medication as prescribed.  i. If you are having problems/concerns with the prescription medicine (does not control pain, nausea, vomiting, rash, itching, etc), please call us [phone] to see if we need to switch you to a different pain medicine that will work better for you and/or control your  side effect better. ii. If you need a refill on your pain medication, please contact your pharmacy.  They will contact our office to request authorization. Prescriptions will not be filled after 5 pm or on week-ends.  Use a Sitz Bath 4-8 times a day for relief A sitz bath is a warm water bath taken in the sitting position that covers only the hips and buttocks. It may be used for either healing or hygiene purposes. Sitz baths are also used to relieve pain, itching, or muscle spasms. The water may contain medicine. Moist heat will help you heal and relax.  HOME CARE INSTRUCTIONS  Take 3 to 4 sitz baths a day.  Fill the bathtub half full with warm water.  Sit in the water and open the drain a little.  Turn on the warm water to keep the tub half full. Keep the water running constantly.  Soak in the water for 15 to 20 minutes.  After the sitz bath, pat the affected area dry first. SEEK MEDICAL CARE IF:  You get worse instead of better. Stop the sitz baths if you get worse.   4. KEEP YOUR BOWELS REGULAR a. The goal is one bowel movement a day b. Avoid getting constipated.  Between the surgery and the pain medications, it is common to experience some constipation.  Increasing fluid intake and taking a fiber supplement (such as Metamucil, Citrucel, FiberCon, MiraLax, etc) 1-2 times a day regularly will usually help prevent this problem from occurring.  A  mild laxative (prune juice, Milk of Magnesia, MiraLax, etc) should be taken according to package directions if there are no bowel movements after 48 hours. c. Watch out for diarrhea.  If you have many loose bowel movements, simplify your diet to bland foods & liquids for a few days.  Stop any stool softeners and decrease your fiber supplement.  Switching to mild anti-diarrheal medications (Kayopectate, Pepto Bismol) can help.  If this worsens or does not improve, please call us.  5. Wound Care a. Remove your bandages the day after surgery.   Unless discharge instructions indicate otherwise, leave your bandage dry and in place overnight.  Remove the bandage during your first bowel movement.   b. Allow the wound packing to fall out over the next few days.  You can trim exposed gauze / ribbon as it falls out.  You do not need to repack the wound unless instructed otherwise.  Wear an absorbent pad or soft cotton gauze in your underwear as needed to catch any drainage and help keep the area  c. Keep the area clean and dry.  Bathe / shower every day.  Keep the area clean by showering / bathing over the incision / wound.   It is okay to soak an open wound to help wash it.  Wet wipes or showers / gentle washing after bowel movements is often less traumatic than regular toilet paper. d. Dennis Bast may have some styrofoam-like soft packing in the rectum which will come out with the first bowel movement.  e. You will often notice bleeding with bowel movements.  This should slow down by the end of the first week of surgery f. Expect some drainage.  This should slow down, too, by the end of the first week of surgery.  Wear an absorbent pad or soft cotton gauze in your underwear until the drainage stops. 6. ACTIVITIES as tolerated:   a. You may resume regular (light) daily activities beginning the next day--such as daily self-care, walking, climbing stairs--gradually increasing activities as tolerated.  If you can walk 30 minutes without difficulty, it is safe to try more intense activity such as jogging, treadmill, bicycling, low-impact aerobics, swimming, etc. b. Save the most intensive and strenuous activity for last such as sit-ups, heavy lifting, contact sports, etc  Refrain from any heavy lifting or straining until you are off narcotics for pain control.   c. DO NOT PUSH THROUGH PAIN.  Let pain be your guide: If it hurts to do something, don't do it.  Pain is your body warning you to avoid that activity for another week until the pain goes down. d. You may  drive when you are no longer taking prescription pain medication, you can comfortably sit for long periods of time, and you can safely maneuver your car and apply brakes. e. Dennis Bast may have sexual intercourse when it is comfortable.  7. FOLLOW UP in our office a. Please call [facility] at [phone] to set up an appointment to see your surgeon in the office for a follow-up appointment approximately 2 weeks after your surgery. b. Make sure that you call for this appointment the day you arrive home to insure a convenient appointment time. 10. IF YOU HAVE DISABILITY OR FAMILY LEAVE FORMS, BRING THEM TO THE OFFICE FOR PROCESSING.  DO NOT GIVE THEM TO YOUR DOCTOR.        WHEN TO CALL us [phone]: 1. Poor pain control 2. Reactions / problems with new medications (rash/itching, nausea, etc)  3. Fever over 101.5 F (38.5 C) 4. Inability to urinate 5. Nausea and/or vomiting 6. Worsening swelling or bruising 7. Continued bleeding from incision. 8. Increased pain, redness, or drainage from the incision  The clinic staff is available to answer your questions during regular business hours (8:30am-5pm).  Please dont hesitate to call and ask to speak to one of our nurses for clinical concerns.   A surgeon from [facility] is always on call at the hospitals   If you have a medical emergency, go to the nearest emergency room or call 911.    [facility], [place], [place], [place], [place]  [address] ? MAIN: [phone] ? TOLL FREE: [phone] ? FAX [phone] [email or web address]  Managing Pain  Pain after surgery or related to activity is often due to strain/injury to muscle, tendon, nerves and/or incisions.  This pain is usually short-term and will improve in a few months.   Many people find it helpful to do the following things TOGETHER to help speed the process of healing and to get back to regular activity more  quickly:  1. Avoid heavy physical activity a.  no lifting greater than 20 pounds b. Do not push through the pain.  Listen to your body and avoid positions and maneuvers than reproduce the pain c. Walking is okay as tolerated, but go slowly and stop when getting sore.  d. Remember: If it hurts to do it, then dont do it! 2. Take Anti-inflammatory medication  a. Take with food/snack around the clock for 1-2 weeks i. This helps the muscle and nerve tissues become less irritable and calm down faster b. Choose ONE of the following over-the-counter medications: i. Naproxen 220mg  tabs (ex. Aleve) 1-2 pills twice a day  ii. Ibuprofen 200mg  tabs (ex. Advil, Motrin) 3-4 pills with every meal and just before bedtime iii. Acetaminophen 500mg  tabs (Tylenol) 1-2 pills with every meal and just before bedtime 3. Use a Heating pad or Ice/Cold Pack a. 4-6 times a day b. May use warm bath/hottub  or showers 4. Try Gentle Massage and/or Stretching  a. at the area of pain many times a day b. stop if you feel pain - do not overdo it  Try these steps together to help you body heal faster and avoid making things get worse.  Doing just one of these things may not be enough.    If you are not getting better after two weeks or are noticing you are getting worse, contact our office for further advice; we may need to re-evaluate you & see what other things we can do to help.  GETTING TO GOOD BOWEL HEALTH. Irregular bowel habits such as constipation and diarrhea can lead to many problems over time.  Having one soft bowel movement a day is the most important way to prevent further problems.  The anorectal canal is designed to handle stretching and feces to safely manage our ability to get rid of solid waste (feces, poop, stool) out of our body.  BUT, hard constipated stools can act like ripping concrete bricks and diarrhea can be a burning fire to this very sensitive area of our body, causing inflamed hemorrhoids, anal  fissures, increasing risk is perirectal abscesses, abdominal pain/bloating, an making irritable bowel worse.     The goal: ONE SOFT BOWEL MOVEMENT A DAY!  To have soft, regular bowel movements:   Drink at least 8 tall glasses of water a day.    Take  plenty of fiber.  Fiber is the undigested part of plant food that passes into the colon, acting s natures broom to encourage bowel motility and movement.  Fiber can absorb and hold large amounts of water. This results in a larger, bulkier stool, which is soft and easier to pass. Work gradually over several weeks up to 6 servings a day of fiber (25g a day even more if needed) in the form of: o Vegetables -- Root (potatoes, carrots, turnips), leafy green (lettuce, salad greens, celery, spinach), or cooked high residue (cabbage, broccoli, etc) o Fruit -- Fresh (unpeeled skin & pulp), Dried (prunes, apricots, cherries, etc ),  or stewed ( applesauce)  o Whole grain breads, pasta, etc (whole wheat)  o Bran cereals   Bulking Agents -- This type of water-retaining fiber generally is easily obtained each day by one of the following:  o Psyllium bran -- The psyllium plant is remarkable because its ground seeds can retain so much water. This product is available as Metamucil, Konsyl, Effersyllium, Per Diem Fiber, or the less expensive generic preparation in drug and health food stores. Although labeled a laxative, it really is not a laxative.  o Methylcellulose -- This is another fiber derived from wood which also retains water. It is available as Citrucel. o Polyethylene Glycol - and artificial fiber commonly called Miralax or Glycolax.  It is helpful for people with gassy or bloated feelings with regular fiber o Flax Seed - a less gassy fiber than psyllium  No reading or other relaxing activity while on the toilet. If bowel movements take longer than 5 minutes, you are too constipated  AVOID CONSTIPATION.  High fiber and water intake usually takes care of  this.  Sometimes a laxative is needed to stimulate more frequent bowel movements, but   Laxatives are not a good long-term solution as it can wear the colon out. o Osmotics (Milk of Magnesia, Fleets phosphosoda, Magnesium citrate, MiraLax, GoLytely) are safer than  o Stimulants (Senokot, Castor Oil, Dulcolax, Ex Lax)    o Do not take laxatives for more than 7days in a row.   IF SEVERELY CONSTIPATED, try a Bowel Retraining Program: o Do not use laxatives.  o Eat a diet high in roughage, such as bran cereals and leafy vegetables.  o Drink six (6) ounces of prune or apricot juice each morning.  o Eat two (2) large servings of stewed fruit each day.  o Take one (1) heaping tablespoon of a psyllium-based bulking agent twice a day. Use sugar-free sweetener when possible to avoid excessive calories.  o Eat a normal breakfast.  o Set aside 15 minutes after breakfast to sit on the toilet, but do not strain to have a bowel movement.  o If you do not have a bowel movement by the third day, use an enema and repeat the above steps.   Controlling diarrhea o Switch to liquids and simpler foods for a few days to avoid stressing your intestines further. o Avoid dairy products (especially milk & ice cream) for a short time.  The intestines often can lose the ability to digest lactose when stressed. o Avoid foods that cause gassiness or bloating.  Typical foods include beans and other legumes, cabbage, broccoli, and dairy foods.  Every person has some sensitivity to other foods, so listen to our body and avoid those foods that trigger problems for you. o Adding fiber (Citrucel, Metamucil, psyllium, Miralax) gradually can help thicken stools by absorbing excess fluid and retrain  the intestines to act more normally.  Slowly increase the dose over a few weeks.  Too much fiber too soon can backfire and cause cramping & bloating. o Probiotics (such as active yogurt, Align, etc) may help repopulate the intestines and  colon with normal bacteria and calm down a sensitive digestive tract.  Most studies show it to be of mild help, though, and such products can be costly. o Medicines: - Bismuth subsalicylate (ex. Kayopectate, Pepto Bismol) every 30 minutes for up to 6 doses can help control diarrhea.  Avoid if pregnant. - Loperamide (Immodium) can slow down diarrhea.  Start with two tablets (4mg  total) first and then try one tablet every 6 hours.  Avoid if you are having fevers or severe pain.  If you are not better or start feeling worse, stop all medicines and call your doctor for advice o Call your doctor if you are getting worse or not better.  Sometimes further testing (cultures, endoscopy, X-ray studies, bloodwork, etc) may be needed to help diagnose and treat the cause of the diarrhea.  WHAT IS AN ANAL FISSURE? An anal fissure (fissure-in-ano) is a small, oval shaped tear in skin that lines the opening of the anus. Fissures typically cause severe pain and bleeding with bowel movements. Fissures are quite common in the general population, but are often confused with other causes of pain and bleeding, such as hemorrhoids.  WHAT ARE THE SYMPTOMS OF AN ANAL FISSURE? The typical symptoms of an anal fissure include severe pain during, and especially after, a bowel movement, lasting from several minutes to a few hours. Patients may also notice bright red blood from the anus that can be seen on the toilet paper or on the stool. Between bowel movements, patients with anal fissures are often relatively symptom-free. Many patients are fearful of having a bowel movement and may try to avoid defecation secondary to the pain.   WHAT CAUSES AN ANAL FISSURE? Fissures are usually caused by trauma to the inner lining of the anus. Patients with tight anal sphincter muscles (i.e., increased muscle tone) are more prone to developing anal fissures. A hard, dry bowel movement is typically responsible, but loose stools and diarrhea can  also be the cause. Following a bowel movement, severe anal pain can produce spasm of the anal sphincter muscle, resulting in a decrease in blood flow to the site of the injury, thus impairing healing of the wound. The next bowel movement results in more pain, anal spasm, decreased blood flow to the area, and the cycle continues. Treatments are aimed at interrupting this cycle by relaxing the anal sphincter muscle to promote healing of the fissure.   Other, less common, causes include inflammatory conditions and certain anal infections or tumors. Anal fissures may be acute (recent onset) or chronic (present for a long period of time). Chronic fissures may be more difficult to treat, and may also have an external lump associated with the tear, called a sentinel pile or skin tag, as well as extra tissue just inside the anal canal (hypertrophied papilla) .  WHAT IS THE TREATMENT OF ANAL FISSURES? The majority of anal fissures do not require surgery. The most common treatment for an acute anal fissure consists of making the stool more formed and bulky with a diet high in fiber and utilization of over-the-counter fiber supplementation (totaling 25-35 grams of fiber/day). Stool softeners and increasing water intake may be necessary to promote soft bowel movements and aid in the healing process. Topical anesthetics for  pain and warm tub baths (sitz baths) for 10-20 minutes several times a day (especially after bowel movements) are soothing and promote relaxation of the anal muscles, which may help the healing process.  Other medications (such as diltiazem) may be prescribed that allow relaxation of the anal sphincter muscles. Your surgeon will go over benefits and side-effects of each of these with you. Narcotic pain medications are not recommended for anal fissures, as they promote constipation. Chronic fissures are generally more difficult to treat, and your surgeon may advise surgical treatment.  WILL THE PROBLEM  RETURN? Fissures can recur easily, and it is quite common for a fully healed fissure to recur after a hard bowel movement or other trauma. Even when the pain and bleeding have subsided, it is very important to continue good bowel habits and a diet high in fiber as a lifestyle change. If the problem returns without an obvious cause, further assessment is warranted.  GETTING TO GOOD BOWEL HEALTH. Irregular bowel habits such as constipation and diarrhea can lead to many problems over time.  Having one soft bowel movement a day is the most important way to prevent further problems.  The anorectal canal is designed to handle stretching and feces to safely manage our ability to get rid of solid waste (feces, poop, stool) out of our body.  BUT, hard constipated stools can act like ripping concrete bricks and diarrhea can be a burning fire to this very sensitive area of our body, causing inflamed hemorrhoids, anal fissures, increasing risk is perirectal abscesses, abdominal pain/bloating, an making irritable bowel worse.     The goal: ONE SOFT BOWEL MOVEMENT A DAY!  To have soft, regular bowel movements:   Drink at least 8 tall glasses of water a day.    Take plenty of fiber.  Fiber is the undigested part of plant food that passes into the colon, acting s natures broom to encourage bowel motility and movement.  Fiber can absorb and hold large amounts of water. This results in a larger, bulkier stool, which is soft and easier to pass. Work gradually over several weeks up to 6 servings a day of fiber (25g a day even more if needed) in the form of: o Vegetables -- Root (potatoes, carrots, turnips), leafy green (lettuce, salad greens, celery, spinach), or cooked high residue (cabbage, broccoli, etc) o Fruit -- Fresh (unpeeled skin & pulp), Dried (prunes, apricots, cherries, etc ),  or stewed ( applesauce)  o Whole grain breads, pasta, etc (whole wheat)  o Bran cereals   Bulking Agents -- This type of  water-retaining fiber generally is easily obtained each day by one of the following:  o Psyllium bran -- The psyllium plant is remarkable because its ground seeds can retain so much water. This product is available as Metamucil, Konsyl, Effersyllium, Per Diem Fiber, or the less expensive generic preparation in drug and health food stores. Although labeled a laxative, it really is not a laxative.  o Methylcellulose -- This is another fiber derived from wood which also retains water. It is available as Citrucel. o Polyethylene Glycol - and artificial fiber commonly called Miralax or Glycolax.  It is helpful for people with gassy or bloated feelings with regular fiber o Flax Seed - a less gassy fiber than psyllium  No reading or other relaxing activity while on the toilet. If bowel movements take longer than 5 minutes, you are too constipated  AVOID CONSTIPATION.  High fiber and water intake usually takes care of  this.  Sometimes a laxative is needed to stimulate more frequent bowel movements, but   Laxatives are not a good long-term solution as it can wear the colon out. o Osmotics (Milk of Magnesia, Fleets phosphosoda, Magnesium citrate, MiraLax, GoLytely) are safer than  o Stimulants (Senokot, Castor Oil, Dulcolax, Ex Lax)    o Do not take laxatives for more than 7days in a row.   IF SEVERELY CONSTIPATED, try a Bowel Retraining Program: o Do not use laxatives.  o Eat a diet high in roughage, such as bran cereals and leafy vegetables.  o Drink six (6) ounces of prune or apricot juice each morning.  o Eat two (2) large servings of stewed fruit each day.  o Take one (1) heaping tablespoon of a psyllium-based bulking agent twice a day. Use sugar-free sweetener when possible to avoid excessive calories.  o Eat a normal breakfast.  o Set aside 15 minutes after breakfast to sit on the toilet, but do not strain to have a bowel movement.  o If you do not have a bowel movement by the third day, use an  enema and repeat the above steps.   Controlling diarrhea o Switch to liquids and simpler foods for a few days to avoid stressing your intestines further. o Avoid dairy products (especially milk & ice cream) for a short time.  The intestines often can lose the ability to digest lactose when stressed. o Avoid foods that cause gassiness or bloating.  Typical foods include beans and other legumes, cabbage, broccoli, and dairy foods.  Every person has some sensitivity to other foods, so listen to our body and avoid those foods that trigger problems for you. o Adding fiber (Citrucel, Metamucil, psyllium, Miralax) gradually can help thicken stools by absorbing excess fluid and retrain the intestines to act more normally.  Slowly increase the dose over a few weeks.  Too much fiber too soon can backfire and cause cramping & bloating. o Probiotics (such as active yogurt, Align, etc) may help repopulate the intestines and colon with normal bacteria and calm down a sensitive digestive tract.  Most studies show it to be of mild help, though, and such products can be costly. o Medicines: - Bismuth subsalicylate (ex. Kayopectate, Pepto Bismol) every 30 minutes for up to 6 doses can help control diarrhea.  Avoid if pregnant. - Loperamide (Immodium) can slow down diarrhea.  Start with two tablets (4mg  total) first and then try one tablet every 6 hours.  Avoid if you are having fevers or severe pain.  If you are not better or start feeling worse, stop all medicines and call your doctor for advice o Call your doctor if you are getting worse or not better.  Sometimes further testing (cultures, endoscopy, X-ray studies, bloodwork, etc) may be needed to help diagnose and treat the cause of the diarrhea. o   WHAT CAN BE DONE IF THE FISSURE DOES NOT HEAL? A fissure that fails to respond to conservative measures should be re-examined. Persistent hard or loose bowel movements, scarring, or spasm of the internal anal muscle all  contribute to delayed healing. Other medical problems such as inflammatory bowel disease (Crohns disease), infections, or anal tumors can cause symptoms similar to anal fissures. Patients suffering from persistent anal pain should be examined to exclude these symptoms. This may include a colonoscopy or an exam in the operating room under anesthesia.  WHAT DOES SURGERY INVOLVE? Surgical options for treating anal fissure include Botulinum toxin (Botox) injection into the  anal sphincter and surgical division of a portion of the internal anal sphincter (lateral internal sphincterotomy). Both of these are performed typically as outpatient, same-day procedures, or occasionally in the office setting. The goal of these surgical options is to promote relaxation of the anal sphincter, thereby decreasing anal pain and spasm, allowing the fissure to heal. Botox injection results in healing in 50-80% of patients, while sphincterotomy is reported to be over 90% successful. If a sentinel pile is present, it may be removed to promote healing of the fissure. All surgical procedures carry some risk, and a sphincterotomy can rarely interfere with ones ability to control gas and stool. Your colon and rectal surgeon will discuss these risks with you to determine the appropriate treatment for your particular situation.  HOW LONG IS THE RECOVERY AFTER SURGERY? It is important to note that complete healing with both medical and surgical treatments can take up to approximately 6-10 weeks. However, acute pain after surgery often disappears after a few days. Most patients will be able to return to work and resume daily activities in a few short days after the surgery.  CAN FISSURES LEAD TO COLON CANCER? Absolutely not. Persistent symptoms, however, need careful evaluation since other conditions other than an anal fissure can cause similar symptoms. Your colon and rectal surgeon may request additional tests, even if your fissure  has successfully healed. A colonoscopy may be required to exclude other causes of rectal bleeding.   General Anesthesia, Care After Refer to this sheet in the next few weeks. These instructions provide you with information on caring for yourself after your procedure. Your health care provider may also give you more specific instructions. Your treatment has been planned according to current medical practices, but problems sometimes occur. Call your health care provider if you have any problems or questions after your procedure. WHAT TO EXPECT AFTER THE PROCEDURE After the procedure, it is typical to experience:  Sleepiness.  Nausea and vomiting. HOME CARE INSTRUCTIONS  For the first 24 hours after general anesthesia:  Have a responsible person with you.  Do not drive a car. If you are alone, do not take public transportation.  Do not drink alcohol.  Do not take medicine that has not been prescribed by your health care provider.  Do not sign important papers or make important decisions.  You may resume a normal diet and activities as directed by your health care provider.  Change bandages (dressings) as directed.  If you have questions or problems that seem related to general anesthesia, call the hospital and ask for the anesthetist or anesthesiologist on call. SEEK MEDICAL CARE IF:  You have nausea and vomiting that continue the day after anesthesia.  You develop a rash. SEEK IMMEDIATE MEDICAL CARE IF:   You have difficulty breathing.  You have chest pain.  You have any allergic problems. Document Released: 04/17/2000 Document Revised: 01/14/2013 Document Reviewed: 07/25/2012 [facility] Patient Information 2015 [place], [place]. This information is not intended to replace advice given to you by your health care provider. Make sure you discuss any questions you have with your health care provider.   Indwelling Urinary Catheter Care You have been given a flexible tube  (catheter) used to drain the bladder. Catheters are often used when a person has difficulty urinating due to blockage, bleeding, infection, or inability to control bladder or bowel movements (incontinence). A catheter requires daily care to prevent infection and blockage. HOME CARE INSTRUCTIONS  Do the following to reduce the risk of  infection. Antibiotic medicines cannot prevent infections. Limit the number of bacteria entering your bladder  Wash your hands for 2 minutes with soapy water before and after handling the catheter.  Wash your bottom and the entire catheter twice daily, as well as after each bowel movement. Wash the tip of the penis or just above the vaginal opening with soap and warm water, rinse, and then wash the rectal area. Always wash from front to back.  When changing from the leg bag to overnight bag or from the overnight bag to leg bag, thoroughly clean the end of the catheter where it connects to the tubing with an alcohol wipe.  Clean the leg bag and overnight bag daily after use. Replace your drainage bags weekly.  Always keep the tubing and bag below the level of your bladder. This allows your urine to drain properly. Lifting the bag or tubing above the level of your bladder will cause dirty urine to flow back into your bladder. If you must briefly lift the bag higher than your bladder, pinch the catheter or tubing to prevent backflow.  Drink enough water and fluids to keep your urine clear or pale yellow, or as directed by your caregiver. This will flush bacteria out of the bladder. Protect tissues from injury  Attach the catheter to your leg so there is no tension on the catheter. Use adhesive tape or a leg strap. If you are using adhesive tape, remove any sticky residue left behind by the previous tape you used.  Place your leg bag on your lower leg. Fasten the straps securely and comfortably.  Do not remove the catheter yourself unless you have been instructed how  to do so. Keep the urinary pathway open  Check throughout the day to be sure your catheter is working and urine is draining freely. Make sure the tubing does not become kinked.  Do not let the drainage bag overfill. SEEK IMMEDIATE MEDICAL CARE IF:   The catheter becomes blocked. Urine is not draining.  Urine is leaking.  You have any pain.  You have a fever. Document Released: 01/09/2005 Document Revised: 12/27/2011 Document Reviewed: 06/10/2009 [facility] Patient Information 2015 [place], [place]. This information is not intended to replace advice given to you by your health care provider. Make sure you discuss any questions you have with your health care provider.   You are to remove the catheter tomorrow morning. Cut along the dotted line where RN showed you to cut. Let all the water drain out. Do this in the shower. Allow the catheter to fall out on its own. Notify Dr Clyda Greener office if you are unable to void with in six hours of catheter being removed.

## 2013-12-02 NOTE — Interval H&P Note (Signed)
History and Physical Interval Note:  12/02/2013 9:44 AM  Christopher Acevedo  has presented today for surgery, with the diagnosis of Chronic Anal Fissure Sentinel External Hemorrhoid  The various methods of treatment have been discussed with the patient and family. After consideration of risks, benefits and other options for treatment, the patient has consented to  Procedure(s): TRANSANAL HEMORRHOIDAL DEARTERIALIZATION AND LIGATION/PEXY, EXTERNAL HEMORRHOIDECTOMY, REMOVAL OF GLUTEAL MASS (N/A) POSSIBLE SPHINCTEROTOMY (N/A) as a surgical intervention .  The patient's history has been reviewed, patient examined, no change in status, stable for surgery.  I have reviewed the patient's chart and labs.  Questions were answered to the patient's satisfaction.     Kyla Duffy C.

## 2013-12-03 ENCOUNTER — Encounter (HOSPITAL_COMMUNITY): Payer: Self-pay | Admitting: Surgery

## 2014-02-10 DIAGNOSIS — I2510 Atherosclerotic heart disease of native coronary artery without angina pectoris: Secondary | ICD-10-CM | POA: Diagnosis not present

## 2014-02-10 DIAGNOSIS — Z79899 Other long term (current) drug therapy: Secondary | ICD-10-CM | POA: Diagnosis not present

## 2014-11-03 DIAGNOSIS — I10 Essential (primary) hypertension: Secondary | ICD-10-CM | POA: Diagnosis not present

## 2014-11-03 DIAGNOSIS — Z0000 Encounter for general adult medical examination without abnormal findings: Secondary | ICD-10-CM | POA: Diagnosis not present

## 2014-11-03 DIAGNOSIS — Z23 Encounter for immunization: Secondary | ICD-10-CM | POA: Diagnosis not present

## 2014-11-03 DIAGNOSIS — Z79899 Other long term (current) drug therapy: Secondary | ICD-10-CM | POA: Diagnosis not present

## 2014-11-03 DIAGNOSIS — R0602 Shortness of breath: Secondary | ICD-10-CM | POA: Diagnosis not present

## 2014-11-03 DIAGNOSIS — I2510 Atherosclerotic heart disease of native coronary artery without angina pectoris: Secondary | ICD-10-CM | POA: Diagnosis not present

## 2014-11-24 ENCOUNTER — Institutional Professional Consult (permissible substitution): Payer: Commercial Managed Care - HMO | Admitting: Pulmonary Disease

## 2014-12-08 ENCOUNTER — Ambulatory Visit (INDEPENDENT_AMBULATORY_CARE_PROVIDER_SITE_OTHER): Payer: Commercial Managed Care - HMO | Admitting: Pulmonary Disease

## 2014-12-08 ENCOUNTER — Encounter: Payer: Self-pay | Admitting: Pulmonary Disease

## 2014-12-08 VITALS — BP 130/82 | HR 73 | Ht 70.0 in | Wt 263.0 lb

## 2014-12-08 DIAGNOSIS — R0600 Dyspnea, unspecified: Secondary | ICD-10-CM

## 2014-12-08 NOTE — Progress Notes (Signed)
Subjective:    Patient ID: Christopher Acevedo, male    DOB: 1940-11-20, 74 y.o.   MRN: [id number]  HPI Consult for evaluation of COPD.  Christopher Acevedo is a 74 year old former heavy smoker referred for evaluation of dyspnea on exertion. He gets short of breath when he plays golf 3-4 times a week. He states that the heat makes his symptoms worse. He does not have any cough, sputum production. He gets part of his care at the [place]. He was diagnosed with COPD there and was briefly on Combivent inhaler. He had PFTs around 2010. He was reportedly told that they were normal. He also has significant symptoms of nasal allergies with clear nasal discharge. He has history of nasal polyps which were resected in 1995. He was on flunisolide nasal spray in the past which helped with her symptoms.  He has a 35-pack-year smoking history. Quit in 2006. He drinks alcohol 3-4 times per week. No illegal drug use. He is retired in 1999. He used to work as a Geophysicist/field seismologist for [employer]. While in the [place] his sodium at [place] and had exposure to agent orange. He had filed a claim at the [place] for this but was denied.  Chest x-ray [11/26/13] No acute cardiopulmonary abnormality.  Past Medical History  Diagnosis Date  . Hypertension   . Arthritis   . Diverticulosis     LOWER ABD PAIN AND DIARRHEA - MOST RECENT EPISODE WAS 11/25/13- BETTER TODAY  . Sleep apnea     USES CPAP MOST NIGHTS  . GERD (gastroesophageal reflux disease)   . Spasm of eyelids     [facility]  . Dry eyes   . Glaucoma     LEFT EYE  . 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 Elevated cholesterol   . Actinic keratosis     DRY SKIN WITH LESIONS- MOSTLY ON FEET AND LOWER LEGS     Current outpatient prescriptions:  .  acetaminophen (TYLENOL ARTHRITIS PAIN) 650 MG CR tablet, Per bottle as needed, Disp: , Rfl:  .  amLODipine (NORVASC) 10 MG tablet, Take 5 mg by mouth every morning. , Disp: , Rfl:   .  aspirin EC 81 MG tablet, Take 81 mg by mouth 3 (three) times a week., Disp: , Rfl:  .  atorvastatin (LIPITOR) 40 MG tablet, Takes 1/2 tablet daily, Disp: , Rfl:  .  Carboxymeth-Glycerin-Polysorb (REFRESH OPTIVE ADVANCED) 0.5-1-0.5 % SOLN, Place 1 drop into both eyes 2 (two) times daily as needed (dry eyes). , Disp: , Rfl:  .  cholecalciferol (VITAMIN D) 1000 UNITS tablet, Take 1,000 Units by mouth every morning. , Disp: , Rfl:  .  cycloSPORINE (RESTASIS) 0.05 % ophthalmic emulsion, Place 1 drop into both eyes 2 (two) times daily., Disp: , Rfl:  .  fluorometholone (FML) 0.1 % ophthalmic suspension, Place 1 drop into both eyes 2 (two) times daily as needed (excessive blinking.)., Disp: , Rfl:  .  ibuprofen (ADVIL,MOTRIN) 600 MG tablet, Take 600 mg by mouth every 6 (six) hours as needed for mild pain. , Disp: , Rfl:  .  KRILL OIL PO, Take 1 tablet by mouth every morning., Disp: , Rfl:  .  lanolin-mineral oil (KERI) LOTN, Apply 1 application topically 3 (three) times a week. As needed for dry patches on skin., Disp: , Rfl:  .  latanoprost (XALATAN) 0.005 % ophthalmic solution, Place 1 drop  into the left eye at bedtime., Disp: , Rfl:  .  omeprazole (PRILOSEC) 20 MG capsule, Take 20 mg by mouth every morning. , Disp: , Rfl:  .  OnabotulinumtoxinA (BOTOX IJ), Inject 1 each as directed every 6 (six) months. Botox injection all the way around both eyes., Disp: , Rfl:  .  PRESCRIPTION MEDICATION, Apply 1 application topically 3 (three) times a week. As needed for dry patches on skin. (Vaseline for men only lotion.), Disp: , Rfl:  .  simvastatin (ZOCOR) 20 MG tablet, Pt takes 1/2 tab, Disp: , Rfl:  .  Soap & Cleansers (LUBRIDERM BODY EX), Apply 1 application topically 3 (three) times a week. As needed for dry patches on skin., Disp: , Rfl:  .  traMADol (ULTRAM) 50 MG tablet, Take 50 mg by mouth every 12 (twelve) hours as needed., Disp: , Rfl:  .  cetirizine (ZYRTEC ALLERGY) 10 MG tablet, Take 10 mg by  mouth daily as needed for allergies. , Disp: , Rfl:     Review of Systems  Complains of dyspnea on exertion. No cough, sputum, hemoptysis, wheezing. Denies any fevers, chills, loss of appetite, weight, malaise. Denies any chest pain, palpitations. Denies any nausea, vomiting, diarrhea, constipation. All other review of systems are negative      Objective:   Physical Exam Blood pressure 130/82, pulse 73, height 5\' 10"  (1.778 m), weight 263 lb (119.296 kg), SpO2 97 %.  Gen.: Awake, alert. In no apparent distress. HEENT: Moist mucous membranes, no JVD or thyromegaly. CVS: Regular rate and rhythm no murmurs rubs gallops. RS: Nonlabored breathing. Clear to auscultation. No wheeze, crackles.  Abdomen: Soft, nontender nondistended. Extremities: No edema.     Assessment & Plan:  Dyspnea on exertion.  He likely has COPD from smoking. Although he was told that he didn't have any abnormalities on PFTs at the [place] in 2010. I will repeat his PFTs here to get a more recent assessment. He will also get started on Spiriva inhaler.  He'll return to clinic in 3 months to evaluate his lung function tests and response to inhaler therapy.  Marshell Garfinkel MD [facility] Pager [phone] If no answer or after 3pm call: [phone] 12/08/2014, 4:43 PM

## 2014-12-08 NOTE — Patient Instructions (Signed)
We will start you on a Spiriva inhaler. Schedule you for lung function tests.  Return to clinic in 3 months.

## 2015-02-09 DIAGNOSIS — I2510 Atherosclerotic heart disease of native coronary artery without angina pectoris: Secondary | ICD-10-CM | POA: Diagnosis not present

## 2015-02-16 ENCOUNTER — Encounter: Payer: Self-pay | Admitting: Allergy and Immunology

## 2015-02-16 ENCOUNTER — Ambulatory Visit (INDEPENDENT_AMBULATORY_CARE_PROVIDER_SITE_OTHER): Payer: Commercial Managed Care - HMO | Admitting: Allergy and Immunology

## 2015-02-16 VITALS — BP 130/85 | HR 78 | Resp 18 | Ht 70.87 in | Wt 267.9 lb

## 2015-02-16 DIAGNOSIS — J339 Nasal polyp, unspecified: Secondary | ICD-10-CM | POA: Insufficient documentation

## 2015-02-16 DIAGNOSIS — J3089 Other allergic rhinitis: Secondary | ICD-10-CM | POA: Diagnosis not present

## 2015-02-16 MED ORDER — PREDNISONE 1 MG PO TABS: 10.0000 mg | ORAL_TABLET | ORAL | Status: DC

## 2015-02-16 NOTE — Assessment & Plan Note (Signed)
   Aeroallergen avoidance measures have been discussed and provided in written form.  Nasal saline spray followed by fluticasone nasal spray (as above).  Continue cetirizine 10 mg daily as needed.

## 2015-02-16 NOTE — Assessment & Plan Note (Signed)
   Prednisone has been provided:60 mg 3 days, 40 mg 3 days,  20 mg 3 days, then stop.  I have encouraged using a nasal steroid daily rather than as needed.  Fluticasone nasal spray, one spray per nostril twice a day.  I have also recommended nasal saline spray (i.e. Simply Saline) as needed prior to medicated nasal sprays.

## 2015-02-16 NOTE — Progress Notes (Signed)
New Patient Note  RE: Christopher Acevedo MRN: [id number] DOB: 1940-12-13 Date of Office Visit: 02/16/2015  Referring provider: Jonathon Jordan, MD Primary care provider: Lilian Coma, MD  Chief Complaint: Nasal Congestion and Other   History of present illness: HPI Comments: Christopher Acevedo is a 75 y.o. male who presents today for consultation of rhinitis. He complains of severe persistent nasal congestion as well as rhinorrhea, postnasal drainage, and sneezing.  These symptoms have progressed over the past year.  No specific environmental triggers have been identified.  He has decreased sense of taste and smell.  He has a history of nasal polyposis with polypectomy 20 years ago.  He has used nasal steroid sporadically over the past 20 years.  He experiences occasional coughing.  This cough initiating the base of the throat and he believes that this may be due to postnasal drainage.  He denies dyspnea, chest tightness, and wheezing.    Assessment and plan: Nasal polyposis  Prednisone has been provided:60 mg 3 days, 40 mg 3 days,  20 mg 3 days, then stop.  I have encouraged using a nasal steroid daily rather than as needed.  Fluticasone nasal spray, one spray per nostril twice a day.  I have also recommended nasal saline spray (i.e. Simply Saline) as needed prior to medicated nasal sprays.  Perennial and seasonal allergic rhinitis  Aeroallergen avoidance measures have been discussed and provided in written form.  Nasal saline spray followed by fluticasone nasal spray (as above).  Continue cetirizine 10 mg daily as needed.    Meds ordered this encounter  Medications  . predniSONE (DELTASONE) tablet 10 mg    Sig:     Diagnositics: Allergy skin testing: Positive to grass pollen, ragweed pollen, tree pollen, and molds.    Physical examination: Blood pressure 130/85, pulse 78, resp. rate 18, height 5' 10.87" (1.8 m), weight 267 lb 13.7 oz (121.5 kg).  General: Alert,  interactive, in no acute distress. HEENT: TMs pearly gray, turbinates edematous with thick discharge, post-pharynx erythematous.  A polyp is visualized on the right side. Neck: Supple without lymphadenopathy. Lungs: Clear to auscultation without wheezing, rhonchi or rales. CV: Normal S1, S2 without murmurs. Abdomen: Nondistended, nontender. Skin: Warm and dry, without lesions or rashes. Extremities:  No clubbing, cyanosis or edema. Neuro:   Grossly intact.  Review of systems: Review of Systems  Constitutional: Negative for fever, chills and weight loss.  HENT: Positive for congestion. Negative for nosebleeds.   Eyes: Negative for blurred vision.  Respiratory: Negative for hemoptysis, shortness of breath and wheezing.   Cardiovascular: Negative for chest pain.  Gastrointestinal: Negative for diarrhea and constipation.  Genitourinary: Negative for dysuria.  Musculoskeletal: Negative for myalgias and joint pain.  Neurological: Negative for dizziness.  Endo/Heme/Allergies: Does not bruise/bleed easily.    Past medical history: Past Medical History  Diagnosis Date  . Hypertension   . Arthritis   . Diverticulosis     LOWER ABD PAIN AND DIARRHEA - MOST RECENT EPISODE WAS 11/25/13- BETTER TODAY  . Sleep apnea     USES CPAP MOST NIGHTS  . GERD (gastroesophageal reflux disease)   . Spasm of eyelids     PT GET[facility]  . Dry eyes   . Glaucoma     LEFT EYE  . 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 Elevated cholesterol   . Actinic keratosis  DRY SKIN WITH LESIONS- MOSTLY ON FEET AND LOWER LEGS    Past surgical history: Past Surgical History  Procedure Laterality Date  . Hernia repair  1990s?  . Right shoulder surgery    . Nasal sinus surgery    . Right thumb and index finger surgery for arthritis    . Blephroplasty Bilateral   . Transanal hemorrhoidal dearterialization N/A 12/02/2013    Procedure: TRANSANAL  HEMORRHOIDAL DEARTERIALIZATION AND LIGATION/PEXY, EXTERNAL HEMORRHOIDECTOMY, REMOVAL OF GLUTEAL MASS;  Surgeon: Michael Boston, MD;  Location: [facility];  Service: General;  Laterality: N/A;    Family history: Family History  Problem Relation Age of Onset  . Rheum arthritis Mother   . Allergic rhinitis Neg Hx   . Angioedema Neg Hx   . Asthma Neg Hx   . Atopy Neg Hx   . Eczema Neg Hx   . Immunodeficiency Neg Hx   . Urticaria Neg Hx     Social history: Social History   Social History  . Marital Status: Married    Spouse Name: N/A  . Number of Children: N/A  . Years of Education: N/A   Occupational History  . Not on file.   Social History Main Topics  . Smoking status: Former Smoker -- 1.00 packs/day for 35 years    Types: Cigarettes    Quit date: 01/24/2004  . Smokeless tobacco: Former Systems developer  . Alcohol Use: 2.4 oz/week    4 Shots of liquor per week     Comment: QUIT SMOKING 123456 -  ONE ALCHOLIC DRINK 3 TO 4 DAYS A WEEK  . Drug Use: No  . Sexual Activity: Yes   Other Topics Concern  . Not on file   Social History Narrative   Environmental History: The patient lives in a 73 year old house with hardwood floors throughout and central air/heat.  There are no pets in the home.  He is a former cigarette smoker.    Medication List       This list is accurate as of: 02/16/15  1:30 PM.  Always use your most recent med list.               amLODipine 10 MG tablet  Commonly known as:  NORVASC  Take 5 mg by mouth every morning.     aspirin EC 81 MG tablet  Take 81 mg by mouth 3 (three) times a week.     atorvastatin 40 MG tablet  Commonly known as:  LIPITOR  Takes 1/2 tablet daily     BOTOX IJ  Inject 1 each as directed every 6 (six) months. Botox injection all the way around both eyes.     cholecalciferol 1000 units tablet  Commonly known as:  VITAMIN D  Take 1,000 Units by mouth every morning.     cycloSPORINE 0.05 % ophthalmic emulsion  Commonly known as:   RESTASIS  Place 1 drop into both eyes 2 (two) times daily.     fluorometholone 0.1 % ophthalmic suspension  Commonly known as:  FML  Place 1 drop into both eyes 2 (two) times daily as needed (excessive blinking.).     fluticasone 50 MCG/ACT nasal spray  Commonly known as:  FLONASE  USE 1 SPRAY IN EACH NOSTRIL ONCE A DAY     ibuprofen 600 MG tablet  Commonly known as:  ADVIL,MOTRIN  Take 600 mg by mouth every 6 (six) hours as needed for mild pain.     KRILL OIL PO  Take 1 tablet by  mouth every morning.     lanolin-mineral oil Lotn  Apply 1 application topically 3 (three) times a week. As needed for dry patches on skin.     latanoprost 0.005 % ophthalmic solution  Commonly known as:  XALATAN  Place 1 drop into the left eye at bedtime.     LUBRIDERM BODY EX  Apply 1 application topically 3 (three) times a week. As needed for dry patches on skin.     omeprazole 20 MG capsule  Commonly known as:  PRILOSEC  Take 20 mg by mouth every morning.     PRESCRIPTION MEDICATION  Apply 1 application topically 3 (three) times a week. As needed for dry patches on skin. (Vaseline for men only lotion.)     REFRESH OPTIVE ADVANCED 0.5-1-0.5 % Soln  Generic drug:  Carboxymeth-Glycerin-Polysorb  Place 1 drop into both eyes 2 (two) times daily as needed (dry eyes). Reported on 02/16/2015     simvastatin 20 MG tablet  Commonly known as:  ZOCOR  Pt takes 1/2 tab     traMADol 50 MG tablet  Commonly known as:  ULTRAM  Take 50 mg by mouth every 12 (twelve) hours as needed.     TYLENOL ARTHRITIS PAIN 650 MG CR tablet  Generic drug:  acetaminophen  Per bottle as needed     ZYRTEC ALLERGY 10 MG tablet  Generic drug:  cetirizine  Take 10 mg by mouth daily as needed for allergies.        Known medication allergies: Allergies  Allergen Reactions  . Codeine Nausea And Vomiting    FELT FAINT    I appreciate the opportunity to take part in this [place]'s care. Please do not hesitate to  contact me with questions.  Sincerely,   R. Edgar Frisk, MD

## 2015-02-16 NOTE — Patient Instructions (Signed)
Nasal polyposis  Prednisone has been provided:60 mg 3 days, 40 mg 3 days,  20 mg 3 days, then stop.  I have encouraged using a nasal steroid daily rather than as needed.  Fluticasone nasal spray, one spray per nostril twice a day.  I have also recommended nasal saline spray (i.e. Simply Saline) as needed prior to medicated nasal sprays.  Perennial and seasonal allergic rhinitis  Aeroallergen avoidance measures have been discussed and provided in written form.  Nasal saline spray followed by fluticasone nasal spray (as above).  Continue cetirizine 10 mg daily as needed.   Return in about 2 months (around 04/16/2015), or if symptoms worsen or fail to improve.  Reducing Pollen Exposure  The American Academy of Allergy, Asthma and Immunology suggests the following steps to reduce your exposure to pollen during allergy seasons.    1. Do not hang sheets or clothing out to dry; pollen may collect on these items. 2. Do not mow lawns or spend time around freshly cut grass; mowing stirs up pollen. 3. Keep windows closed at night.  Keep car windows closed while driving. 4. Minimize morning activities outdoors, a time when pollen counts are usually at their highest. 5. Stay indoors as much as possible when pollen counts or humidity is high and on windy days when pollen tends to remain in the air longer. 6. Use air conditioning when possible.  Many air conditioners have filters that trap the pollen spores. 7. Use a HEPA room air filter to remove pollen form the indoor air you breathe.   Control of Mold Allergen  Mold and fungi can grow on a variety of surfaces provided certain temperature and moisture conditions exist.  Outdoor molds grow on plants, decaying vegetation and soil.  The major outdoor mold, Alternaria and Cladosporium, are found in very high numbers during hot and dry conditions.  Generally, a late Summer - Fall peak is seen for common outdoor fungal spores.  Rain will temporarily  lower outdoor mold spore count, but counts rise rapidly when the rainy period ends.  The most important indoor molds are Aspergillus and Penicillium.  Dark, humid and poorly ventilated basements are ideal sites for mold growth.  The next most common sites of mold growth are the bathroom and the kitchen.  Outdoor [employer] 1. Use air conditioning and keep windows closed 2. Avoid exposure to decaying vegetation. 3. Avoid leaf raking. 4. Avoid grain handling. 5. Consider wearing a face mask if working in moldy areas.  Indoor Mold Control 1. Maintain humidity below 50%. 2. Clean washable surfaces with 5% bleach solution. 3. Remove sources e.g. Contaminated carpets.

## 2015-02-23 ENCOUNTER — Encounter: Payer: Self-pay | Admitting: *Deleted

## 2015-03-10 ENCOUNTER — Ambulatory Visit (INDEPENDENT_AMBULATORY_CARE_PROVIDER_SITE_OTHER): Payer: Commercial Managed Care - HMO | Admitting: Pulmonary Disease

## 2015-03-10 ENCOUNTER — Encounter: Payer: Self-pay | Admitting: Pulmonary Disease

## 2015-03-10 VITALS — BP 128/68 | HR 72 | Ht 71.0 in | Wt 263.0 lb

## 2015-03-10 DIAGNOSIS — R0600 Dyspnea, unspecified: Secondary | ICD-10-CM

## 2015-03-10 LAB — PULMONARY FUNCTION TEST
DL/VA % pred: 113 %
DL/VA: 5.3 ml/min/mmHg/L
DLCO cor % pred: 94 %
DLCO cor: 31.69 ml/min/mmHg
DLCO unc % pred: 89 %
DLCO unc: 30.06 ml/min/mmHg
FEF 25-75 Post: 2.19 L/sec
FEF 25-75 Pre: 1.93 L/sec
FEF2575-%Change-Post: 13 %
FEF2575-%PRED-PRE: 79 %
FEF2575-%Pred-Post: 89 %
FEV1-%Change-Post: 2 %
FEV1-%PRED-PRE: 81 %
FEV1-%Pred-Post: 83 %
FEV1-POST: 2.74 L
FEV1-PRE: 2.67 L
FEV1FVC-%Change-Post: 1 %
FEV1FVC-%Pred-Pre: 101 %
FEV6-%CHANGE-POST: 0 %
FEV6-%Pred-Post: 84 %
FEV6-%Pred-Pre: 84 %
FEV6-Post: 3.58 L
FEV6-Pre: 3.56 L
FEV6FVC-%Change-Post: 0 %
FEV6FVC-%PRED-POST: 104 %
FEV6FVC-%PRED-PRE: 105 %
FVC-%Change-Post: 1 %
FVC-%PRED-POST: 81 %
FVC-%Pred-Pre: 79 %
FVC-POST: 3.65 L
FVC-Pre: 3.59 L
POST FEV6/FVC RATIO: 98 %
PRE FEV1/FVC RATIO: 74 %
Post FEV1/FVC ratio: 75 %
Pre FEV6/FVC Ratio: 99 %
RV % PRED: 151 %
RV: 3.89 L
TLC % pred: 99 %
TLC: 7.21 L

## 2015-03-10 NOTE — Progress Notes (Signed)
Subjective:    Patient ID: Christopher Acevedo, male    DOB: 1940-10-24, 75 y.o.   MRN: [id number]  HPI Follow evaluation of COPD.  Christopher Acevedo is a 75 year old former heavy smoker referred for evaluation of dyspnea on exertion. He gets short of breath when he plays golf 3-4 times a week. He states that the heat makes his symptoms worse. He does not have any cough, sputum production. He gets part of his care at the [place]. He was diagnosed with COPD there and was briefly on Combivent inhaler. He had PFTs around 2010. He was reportedly told that they were normal. He also has significant symptoms of nasal allergies with clear nasal discharge. He has history of nasal polyps which were resected in 1995. He was on flunisolide nasal spray in the past which helped with her symptoms.  DATA: Chest x-ray [11/26/13] No acute cardiopulmonary abnormality.  PFTs 03/10/15 FVC 3.59 [719%) FEV1 2.6 and [81%) F/F 74 TLC 99% DLCO 89%.  Social History: He has a 35-pack-year smoking history. Quit in 2006. He drinks alcohol 3-4 times per week. No illegal drug use. He is retired in 1999. He used to work as a Geophysicist/field seismologist for [employer]. While in the [employer] he had exposure to agent orange. He had filed a claim at the [place] for this but was denied.  Family History: Non contributory  Past Medical History  Diagnosis Date  . Hypertension   . Arthritis   . Diverticulosis     LOWER ABD PAIN AND DIARRHEA - MOST RECENT EPISODE WAS 11/25/13- BETTER TODAY  . Sleep apnea     USES CPAP MOST NIGHTS  . GERD (gastroesophageal reflux disease)   . Spasm of eyelids     PT [facility]  . Dry eyes   . Glaucoma     LEFT EYE  . 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 Elevated cholesterol   . Actinic keratosis     DRY SKIN WITH LESIONS- MOSTLY ON FEET AND LOWER LEGS     Current outpatient prescriptions:  .  acetaminophen (TYLENOL ARTHRITIS PAIN) 650 MG CR tablet, Per  bottle as needed, Disp: , Rfl:  .  amLODipine (NORVASC) 10 MG tablet, Take 5 mg by mouth every morning. , Disp: , Rfl:  .  aspirin EC 81 MG tablet, Take 81 mg by mouth 3 (three) times a week., Disp: , Rfl:  .  atorvastatin (LIPITOR) 40 MG tablet, Takes 1/2 tablet daily, Disp: , Rfl:  .  Carboxymeth-Glycerin-Polysorb (REFRESH OPTIVE ADVANCED) 0.5-1-0.5 % SOLN, Place 1 drop into both eyes 2 (two) times daily as needed (dry eyes). Reported on 02/16/2015, Disp: , Rfl:  .  cetirizine (ZYRTEC ALLERGY) 10 MG tablet, Take 10 mg by mouth daily as needed for allergies. , Disp: , Rfl:  .  cholecalciferol (VITAMIN D) 1000 UNITS tablet, Take 1,000 Units by mouth every morning. , Disp: , Rfl:  .  cycloSPORINE (RESTASIS) 0.05 % ophthalmic emulsion, Place 1 drop into both eyes 2 (two) times daily., Disp: , Rfl:  .  fluorometholone (FML) 0.1 % ophthalmic suspension, Place 1 drop into both eyes 2 (two) times daily as needed (excessive blinking.)., Disp: , Rfl:  .  fluticasone (FLONASE) 50 MCG/ACT nasal spray, USE 1 SPRAY IN EACH NOSTRIL ONCE A DAY, Disp: , Rfl: 5 .  ibuprofen (ADVIL,MOTRIN) 600 MG tablet, Take 600 mg by mouth every 6 (  six) hours as needed for mild pain. , Disp: , Rfl:  .  KRILL OIL PO, Take 1 tablet by mouth every morning., Disp: , Rfl:  .  lanolin-mineral oil (KERI) LOTN, Apply 1 application topically 3 (three) times a week. As needed for dry patches on skin., Disp: , Rfl:  .  latanoprost (XALATAN) 0.005 % ophthalmic solution, Place 1 drop into the left eye at bedtime., Disp: , Rfl:  .  omeprazole (PRILOSEC) 20 MG capsule, Take 20 mg by mouth every morning. , Disp: , Rfl:  .  OnabotulinumtoxinA (BOTOX IJ), Inject 1 each as directed every 6 (six) months. Botox injection all the way around both eyes., Disp: , Rfl:  .  PRESCRIPTION MEDICATION, Apply 1 application topically 3 (three) times a week. As needed for dry patches on skin. (Vaseline for men only lotion.), Disp: , Rfl:  .  Soap & Cleansers  (LUBRIDERM BODY EX), Apply 1 application topically 3 (three) times a week. As needed for dry patches on skin., Disp: , Rfl:  .  traMADol (ULTRAM) 50 MG tablet, Take 50 mg by mouth every 12 (twelve) hours as needed., Disp: , Rfl:   Current facility-administered medications:  .  predniSONE (DELTASONE) tablet 10 mg, 10 mg, Oral, UD, Adelina Mings, MD    Review of Systems  Complains of dyspnea on exertion. No cough, sputum, hemoptysis, wheezing. Denies any fevers, chills, loss of appetite, weight, malaise. Denies any chest pain, palpitations. Denies any nausea, vomiting, diarrhea, constipation. All other review of systems are negative      Objective:   Physical Exam Blood pressure 128/68, pulse 72, height 5\' 11"  (1.803 m), weight 263 lb (119.296 kg), SpO2 90 %.  Gen.: Awake, alert. In no apparent distress. HEENT: Moist mucous membranes, no JVD or thyromegaly. CVS: Regular rate and rhythm no murmurs rubs gallops. RS: Nonlabored breathing. Clear to auscultation. No wheeze, crackles.  Abdomen: Soft, nontender nondistended. Extremities: No edema.     Assessment & Plan:  Dyspnea on exertion. His PFTs from today were reviewed. He does not meet diagnosis of obstruction by F/F criteria but there is some scooping of the expiratory loop that may suggest beginning of airway disease. Although he has a significant smoking history I do not believe he has COPD. He tried Spiriva for a month but that did not change his symptoms. I believe his dyspnea on exertion is normal for his age and may be exacerbated by his obesity. I have reassured him and told him he does not need to be on inhalers and he can follow-up with [place] as needed.  OSA. Stable since 2000s. No issues with Bipap. He will follow up at [facility].  Return to clinic as needed.   Marshell Garfinkel MD [facility] Pager [phone] If no answer or after 3pm call: [phone] 03/10/2015, 4:26 PM

## 2015-03-10 NOTE — Progress Notes (Signed)
PFT done today. 

## 2015-03-30 DIAGNOSIS — G4733 Obstructive sleep apnea (adult) (pediatric): Secondary | ICD-10-CM | POA: Diagnosis not present

## 2015-04-20 ENCOUNTER — Ambulatory Visit: Payer: Commercial Managed Care - HMO | Admitting: Allergy and Immunology

## 2015-04-27 DIAGNOSIS — M722 Plantar fascial fibromatosis: Secondary | ICD-10-CM | POA: Diagnosis not present

## 2015-04-30 ENCOUNTER — Ambulatory Visit: Payer: Commercial Managed Care - HMO | Admitting: Podiatry

## 2015-05-05 ENCOUNTER — Ambulatory Visit: Payer: Commercial Managed Care - HMO | Admitting: Podiatry

## 2015-05-12 DIAGNOSIS — M7542 Impingement syndrome of left shoulder: Secondary | ICD-10-CM | POA: Diagnosis not present

## 2015-07-08 ENCOUNTER — Encounter: Payer: Self-pay | Admitting: Allergy and Immunology

## 2015-07-08 ENCOUNTER — Ambulatory Visit (INDEPENDENT_AMBULATORY_CARE_PROVIDER_SITE_OTHER): Payer: Commercial Managed Care - HMO | Admitting: Allergy and Immunology

## 2015-07-08 VITALS — BP 126/76 | HR 82 | Temp 98.2°F | Resp 20

## 2015-07-08 DIAGNOSIS — J3089 Other allergic rhinitis: Secondary | ICD-10-CM

## 2015-07-08 DIAGNOSIS — J339 Nasal polyp, unspecified: Secondary | ICD-10-CM

## 2015-07-08 NOTE — Progress Notes (Signed)
     FOLLOW UP NOTE  RE: Christopher Acevedo MRN: [id number] DOB: 28-Nov-1940 [facility] [place] [address] [place] [address] Date of Office Visit: 07/08/2015  Subjective:  Christopher Acevedo is a 75 y.o. male who presents today for Nasal Congestion  Assessment:   1. Perennial and seasonal allergic rhinitis   2. Nasal polyposis    Plan:   Meds ordered this encounter  Medications  . Azelastine-Fluticasone 137-50 MCG/ACT SUSP    Sig: Use 1 spray twice daily for congestion.    Dispense:  1 Bottle    Refill:  2  1.  Prednisone 30mg  now and continue daily for 3 days then 20mg  for one day and 10mg  on last day. 2.  Use Dymista 1 spray twice daily. 3.  Saline nasal wash twice daily prior to [place]. 4.  Continue Zyrtec 10mg  once daily. 5.  Schedule appt with ENT--Dr. Wilburn Cornelia [phone]. 6.  Follow-up in  4 months or sooner if needed.  HPI:  Christopher Acevedo returns to the office reporting nasal congestion and rhinorrhea.  Since his last visit with Dr. Verlin Fester, January 2017 for allergic rhinitis and nasal polyposis, he completed prednisone and is using intranasal steroid daily.  He had significant improvement in symptoms and was pleased with his daily regime until the recent days.  Now he is mouth breathing, particularly prominent at night.  He notices rare, postnasal drip, without cough or any chest symptoms.  He denies fever, headache, sore throat, discolored drainage or sneezing.  Denies ED or urgent care visits, recent prednisone or antibiotic courses. Reports sleep and activity are normal.  He has not seen ENT in many years.  Christopher Acevedo has a current medication list which includes the following prescription(s): acetaminophen, amlodipine, aspirin ec, atorvastatin, carboxymeth-glycerin-polysorb, cetirizine, cyclosporine, fluorometholone, fluticasone, ibuprofen, krill oil, lanolin-mineral oil, latanoprost, omeprazole, onabotulinumtoxina, PRESCRIPTION MEDICATION,  tramadol.  Drug Allergies: Allergies  Allergen Reactions  . Codeine Nausea And Vomiting    FELT FAINT   Objective:   Filed Vitals:   07/08/15 1737  BP: 126/76  Pulse: 82  Temp: 98.2 F (36.8 C)  Resp: 20   SpO2 Readings from Last 1 Encounters:  07/08/15 96%   Physical Exam  Constitutional: He is well-developed, well-nourished, and in no distress.  HENT:  Head: Atraumatic.  Right Ear: Tympanic membrane and ear canal normal.  Left Ear: Tympanic membrane and ear canal normal.  Nose: Mucosal edema (Right nasal polyp.) and rhinorrhea (Clear mucus bilaterally.) present. No epistaxis.  Mouth/Throat: Oropharynx is clear and moist and mucous membranes are normal. No oropharyngeal exudate, posterior oropharyngeal edema or posterior oropharyngeal erythema.  Eyes: Conjunctivae are normal.  Neck: Neck supple.  Cardiovascular: Normal rate, S1 normal and S2 normal.   No murmur heard. Pulmonary/Chest: Effort normal and breath sounds normal. He has no wheezes. He has no rhonchi. He has no rales.  Lymphadenopathy:    He has no cervical adenopathy.  Skin: Skin is warm and intact. No rash noted. No cyanosis. Nails show no clubbing.     Leticia Coletta M. Ishmael Holter, MD  cc: Lilian Coma, MD

## 2015-07-08 NOTE — Patient Instructions (Addendum)
   Prednisone 30mg  now and continue daily for 3 days then 20mg  for one day and 10mg  on last day.  Use Dymista 1 spray twice daily.  Saline nasal wash twice daily prior to [place].  Continue Zyrtec 10mg  once daily.  Schedule appt with ENT--Dr. Wilburn Cornelia [phone].  Follow-up in  4 months or sooner if needed.

## 2015-07-10 DIAGNOSIS — K760 Fatty (change of) liver, not elsewhere classified: Secondary | ICD-10-CM | POA: Insufficient documentation

## 2015-07-10 DIAGNOSIS — M129 Arthropathy, unspecified: Secondary | ICD-10-CM | POA: Insufficient documentation

## 2015-07-10 DIAGNOSIS — I714 Abdominal aortic aneurysm, without rupture, unspecified: Secondary | ICD-10-CM | POA: Insufficient documentation

## 2015-07-10 DIAGNOSIS — I2510 Atherosclerotic heart disease of native coronary artery without angina pectoris: Secondary | ICD-10-CM | POA: Insufficient documentation

## 2015-07-10 DIAGNOSIS — G4730 Sleep apnea, unspecified: Secondary | ICD-10-CM | POA: Insufficient documentation

## 2015-07-10 DIAGNOSIS — I10 Essential (primary) hypertension: Secondary | ICD-10-CM | POA: Insufficient documentation

## 2015-07-10 DIAGNOSIS — K649 Unspecified hemorrhoids: Secondary | ICD-10-CM | POA: Insufficient documentation

## 2015-07-10 DIAGNOSIS — G245 Blepharospasm: Secondary | ICD-10-CM | POA: Insufficient documentation

## 2015-07-10 DIAGNOSIS — D7581 Myelofibrosis: Secondary | ICD-10-CM | POA: Insufficient documentation

## 2015-07-10 MED ORDER — AZELASTINE-FLUTICASONE 137-50 MCG/ACT NA SUSP
NASAL | Status: DC
Start: 2015-07-10 — End: 2020-02-27

## 2015-10-06 DIAGNOSIS — G4733 Obstructive sleep apnea (adult) (pediatric): Secondary | ICD-10-CM | POA: Diagnosis not present

## 2015-10-19 DIAGNOSIS — J310 Chronic rhinitis: Secondary | ICD-10-CM | POA: Diagnosis not present

## 2015-10-19 DIAGNOSIS — G4733 Obstructive sleep apnea (adult) (pediatric): Secondary | ICD-10-CM | POA: Diagnosis not present

## 2015-10-19 DIAGNOSIS — J338 Other polyp of sinus: Secondary | ICD-10-CM | POA: Diagnosis not present

## 2015-12-07 DIAGNOSIS — G245 Blepharospasm: Secondary | ICD-10-CM | POA: Diagnosis not present

## 2015-12-07 DIAGNOSIS — J339 Nasal polyp, unspecified: Secondary | ICD-10-CM | POA: Diagnosis not present

## 2015-12-07 DIAGNOSIS — K219 Gastro-esophageal reflux disease without esophagitis: Secondary | ICD-10-CM | POA: Diagnosis not present

## 2015-12-07 DIAGNOSIS — I10 Essential (primary) hypertension: Secondary | ICD-10-CM | POA: Diagnosis not present

## 2015-12-07 DIAGNOSIS — Z79899 Other long term (current) drug therapy: Secondary | ICD-10-CM | POA: Diagnosis not present

## 2015-12-07 DIAGNOSIS — I714 Abdominal aortic aneurysm, without rupture: Secondary | ICD-10-CM | POA: Diagnosis not present

## 2015-12-07 DIAGNOSIS — I2510 Atherosclerotic heart disease of native coronary artery without angina pectoris: Secondary | ICD-10-CM | POA: Diagnosis not present

## 2015-12-07 DIAGNOSIS — G4730 Sleep apnea, unspecified: Secondary | ICD-10-CM | POA: Diagnosis not present

## 2015-12-07 DIAGNOSIS — Z0000 Encounter for general adult medical examination without abnormal findings: Secondary | ICD-10-CM | POA: Diagnosis not present

## 2016-01-31 DIAGNOSIS — M1711 Unilateral primary osteoarthritis, right knee: Secondary | ICD-10-CM | POA: Diagnosis not present

## 2016-02-08 DIAGNOSIS — J330 Polyp of nasal cavity: Secondary | ICD-10-CM | POA: Diagnosis not present

## 2016-02-08 DIAGNOSIS — J323 Chronic sphenoidal sinusitis: Secondary | ICD-10-CM | POA: Diagnosis not present

## 2016-02-08 DIAGNOSIS — J339 Nasal polyp, unspecified: Secondary | ICD-10-CM | POA: Diagnosis not present

## 2016-02-08 DIAGNOSIS — J320 Chronic maxillary sinusitis: Secondary | ICD-10-CM | POA: Diagnosis not present

## 2016-02-14 DIAGNOSIS — M1711 Unilateral primary osteoarthritis, right knee: Secondary | ICD-10-CM | POA: Diagnosis not present

## 2016-03-01 ENCOUNTER — Other Ambulatory Visit: Payer: Self-pay | Admitting: Otolaryngology

## 2016-03-03 DIAGNOSIS — M1711 Unilateral primary osteoarthritis, right knee: Secondary | ICD-10-CM | POA: Diagnosis not present

## 2016-03-07 ENCOUNTER — Encounter (HOSPITAL_COMMUNITY)
Admission: RE | Admit: 2016-03-07 | Discharge: 2016-03-07 | Disposition: A | Discharge status: Home / Self Care | Payer: Medicare HMO | Source: Ambulatory Visit | Attending: Otolaryngology | Admitting: Otolaryngology

## 2016-03-07 ENCOUNTER — Encounter (HOSPITAL_COMMUNITY): Payer: Self-pay

## 2016-03-07 DIAGNOSIS — Z885 Allergy status to narcotic agent status: Secondary | ICD-10-CM | POA: Diagnosis not present

## 2016-03-07 DIAGNOSIS — Z955 Presence of coronary angioplasty implant and graft: Secondary | ICD-10-CM | POA: Diagnosis not present

## 2016-03-07 DIAGNOSIS — Z8719 Personal history of other diseases of the digestive system: Secondary | ICD-10-CM | POA: Diagnosis not present

## 2016-03-07 DIAGNOSIS — K219 Gastro-esophageal reflux disease without esophagitis: Secondary | ICD-10-CM | POA: Diagnosis not present

## 2016-03-07 DIAGNOSIS — Z85820 Personal history of malignant melanoma of skin: Secondary | ICD-10-CM | POA: Diagnosis not present

## 2016-03-07 DIAGNOSIS — J338 Other polyp of sinus: Secondary | ICD-10-CM | POA: Diagnosis not present

## 2016-03-07 DIAGNOSIS — I10 Essential (primary) hypertension: Secondary | ICD-10-CM

## 2016-03-07 DIAGNOSIS — Z01810 Encounter for preprocedural cardiovascular examination: Secondary | ICD-10-CM | POA: Insufficient documentation

## 2016-03-07 DIAGNOSIS — E7800 Pure hypercholesterolemia, unspecified: Secondary | ICD-10-CM | POA: Diagnosis not present

## 2016-03-07 DIAGNOSIS — J329 Chronic sinusitis, unspecified: Secondary | ICD-10-CM | POA: Diagnosis not present

## 2016-03-07 DIAGNOSIS — Z01812 Encounter for preprocedural laboratory examination: Secondary | ICD-10-CM | POA: Insufficient documentation

## 2016-03-07 DIAGNOSIS — Z87891 Personal history of nicotine dependence: Secondary | ICD-10-CM | POA: Diagnosis not present

## 2016-03-07 DIAGNOSIS — H409 Unspecified glaucoma: Secondary | ICD-10-CM | POA: Diagnosis not present

## 2016-03-07 DIAGNOSIS — G4733 Obstructive sleep apnea (adult) (pediatric): Secondary | ICD-10-CM | POA: Diagnosis not present

## 2016-03-07 DIAGNOSIS — R69 Illness, unspecified: Secondary | ICD-10-CM | POA: Diagnosis not present

## 2016-03-07 DIAGNOSIS — I2510 Atherosclerotic heart disease of native coronary artery without angina pectoris: Secondary | ICD-10-CM | POA: Diagnosis not present

## 2016-03-07 DIAGNOSIS — M1990 Unspecified osteoarthritis, unspecified site: Secondary | ICD-10-CM | POA: Diagnosis not present

## 2016-03-07 HISTORY — DX: Malignant (primary) neoplasm, unspecified: C80.1

## 2016-03-07 HISTORY — DX: Chronic sinusitis, unspecified: J32.9

## 2016-03-07 HISTORY — DX: Other allergic rhinitis: J30.89

## 2016-03-07 LAB — BASIC METABOLIC PANEL
Anion gap: 8 (ref 5–15)
BUN: 15 mg/dL (ref 6–20)
CALCIUM: 9.2 mg/dL (ref 8.9–10.3)
CHLORIDE: 103 mmol/L (ref 101–111)
CO2: 28 mmol/L (ref 22–32)
CREATININE: 0.85 mg/dL (ref 0.61–1.24)
GFR calc Af Amer: >60 mL/min (ref >60)
GFR calc non Af Amer: >60 mL/min (ref >60)
Glucose, Bld: 122 mg/dL — ABNORMAL HIGH (ref 65–99)
Potassium: 4.2 mmol/L (ref 3.5–5.1)
Sodium: 139 mmol/L (ref 135–145)

## 2016-03-07 LAB — CBC
HCT: 41.6 % (ref 39.0–52.0)
Hemoglobin: 14 g/dL (ref 13.0–17.0)
MCH: 32.3 pg (ref 26.0–34.0)
MCHC: 33.7 g/dL (ref 30.0–36.0)
MCV: 95.9 fL (ref 78.0–100.0)
PLATELETS: 203 10*3/uL (ref 150–400)
RBC: 4.34 MIL/uL (ref 4.22–5.81)
RDW: 12.6 % (ref 11.5–15.5)
WBC: 10.2 10*3/uL (ref 4.0–10.5)

## 2016-03-07 NOTE — Progress Notes (Signed)
Pt denies SOB, chest pain, and being under the care of a cardiologist. Pt stated that a stress test was performed >10 years ago but denies having an echo and cardiac cath. Pt denies having an EKG and chest x ray within the last year. Pt denies having recent labs. Spoke with Leafy Ro, Surgical Coordinator, to clarify order for consent the reason for surgery should read  " chronic sinusitis and nasal polyp ( per [facility]). "  Awaiting a new order for consent. Pt chart forwarded to anesthesia to review EKG.

## 2016-03-07 NOTE — Pre-Procedure Instructions (Signed)
Christopher Acevedo  03/07/2016      [id number] - [place], [place] - [address] [place] [address] Phone: [phone] Fax: [facility] - [place], [place] [address] Phone: [phone] Fax: [phone]    Your procedure is scheduled on Friday, March 10, 2016  Report to [facility] Admitting at 6:50 A.M.  Call this number if you have problems the morning of surgery:  [phone]   Remember:  Do not eat food or drink liquids after midnight Thursday, March 09, 2016  Take these medicines the morning of surgery with A SIP OF WATER : amLODipine (NORVASC), omeprazole (PRILOSEC), eye drops, nasal spray, if needed: traMADol (ULTRAM) for pain Stop taking Aspirin, vitamins, fish oil, krill oil, Co Q 10 and herbal medications. Do not take any NSAIDs ie: Ibuprofen, Advil, Naproxen or any medication containing Aspirin; stop now.  Do not wear jewelry, make-up or nail polish.  Do not wear lotions, powders, or perfumes, or deoderant.  Do not shave 48 hours prior to surgery.  Men may shave face and neck.  Do not bring valuables to the hospital.  [facility] is not responsible for any belongings or valuables.  Contacts, dentures or bridgework may not be worn into surgery.  Leave your suitcase in the car.  After surgery it may be brought to your room.  For patients admitted to the hospital, discharge time will be determined by your treatment team.  Patients discharged the day of surgery will not be allowed to drive home.  Special instructions:   [facility] - Preparing for Surgery  Before surgery, you can play an important role.  Because skin is not sterile, your skin needs to be as free of germs as possible.  You can reduce the number of germs on you skin by washing with CHG (chlorahexidine gluconate) soap before surgery.  CHG is  an antiseptic cleaner which kills germs and bonds with the skin to continue killing germs even after washing.  Please DO NOT use if you have an allergy to CHG or antibacterial soaps.  If your skin becomes reddened/irritated stop using the CHG and inform your nurse when you arrive at Short Stay.  Do not shave (including legs and underarms) for at least 48 hours prior to the first CHG shower.  You may shave your face.  Please follow these instructions carefully:   1.  Shower with CHG Soap the night before surgery and the morning of Surgery.  2.  If you choose to wash your hair, wash your hair first as usual with your normal shampoo.  3.  After you shampoo, rinse your hair and body thoroughly to remove the Shampoo.  4.  Use CHG as you would any other liquid soap.  You can apply chg directly  to the skin and wash gently with scrungie or a clean washcloth.  5.  Apply the CHG Soap to your body ONLY FROM THE NECK DOWN.  Do not use on open wounds or open sores.  Avoid contact with your eyes, ears, mouth and genitals (private parts).  Wash genitals (private parts) with your normal soap.  6.  Wash thoroughly, paying special attention to the area where your surgery will be performed.  7.  Thoroughly rinse your body with warm water from the neck down.  8.  DO NOT  shower/wash with your normal soap after using and rinsing off the CHG Soap.  9.  Pat yourself dry with a clean towel.            10.  Wear clean pajamas.            11.  Place clean sheets on your bed the night of your first shower and do not sleep with pets.  Day of Surgery  Do not apply any lotions/deodorants the morning of surgery.  Please wear clean clothes to the hospital/surgery center.  Please read over the following fact sheets that you were given. Pain Booklet, Coughing and Deep Breathing and Surgical Site Infection Prevention

## 2016-03-08 NOTE — Progress Notes (Signed)
Anesthesia chart review: Patient is a 76 year old male scheduled for bilateral revision endoscopic sinus surgery, nasal polypectomy with fusion scan on 03/10/2016 by Dr. Wilburn Cornelia.  History includes former smoker (quit '06), HTN, OSA (CPAP), GERD, glaucoma (left eye), hearing loss (hearing aids), hypercholesterolemia, chronic sinusitis, skin cancer (BCC, melanoma), nasal sinus surgery, hernia repair, blepharospasm (receives Botox injections at the [facility]).  PCP is Dr. Jonathon Jordan.  Meds include amlodipine, Lipitor, azelastine-fluticasone, fluorometholone ophthalmic, Flonase, krill oil, Xalatan ophthalmic, Prilosec, tramadol.  BP 123/62   Pulse 60   Temp 36.6 C   Resp 20   Ht 5\' 11"  (1.803 m)   Wt 257 lb 6.4 oz (116.8 kg)   SpO2 98%   BMI 35.90 kg/m   EKG 03/07/2016: Sinus bradycardia at 57 bpm, right bundle branch block. No significant change since last tracing 11/26/2013.   He reported a prior stress test > 10 years ago. No prior echo or cath.  PFTs 03/10/15: FVC 3.59 (79%), FEV1 2.67 (81%), DLCOunc 30.06 (89%).   CT paranasal sinuse 02/08/16 [facility]; Care Everywhere): IMPRESSION: 1. Sinonasal polyposis with widespread paranasal sinus mucoperiosteal thickening. The left frontal sinus is relatively spared. There is bubbly opacity in the right maxillary and right sphenoid sinuses suggesting a degree of acute sinusitis. 2. Bulky posterior nasal cavity polyps with virtually complete opacification of the posterior nasal cavity. The largest discrete polyp is on the right measuring at least 6 cm in length and effacing the right nasopharynx. 3. The underlying nasal cavity mucosa appears somewhat atrophic.  Preoperative labs noted.  If no acute changes then I anticipate that he can proceed as planned.  [facility] Short Stay Center/Anesthesiology Phone [phone] 03/08/2016 12:38 PM

## 2016-03-10 ENCOUNTER — Ambulatory Visit (HOSPITAL_COMMUNITY)
Admission: RE | Admit: 2016-03-10 | Discharge: 2016-03-10 | Disposition: A | Discharge status: Home / Self Care | Payer: Medicare HMO | Source: Ambulatory Visit | Attending: Otolaryngology | Admitting: Otolaryngology

## 2016-03-10 ENCOUNTER — Encounter (HOSPITAL_COMMUNITY)
Admission: RE | Disposition: A | Discharge status: Home / Self Care | Payer: Self-pay | Source: Ambulatory Visit | Attending: Otolaryngology

## 2016-03-10 ENCOUNTER — Ambulatory Visit (HOSPITAL_COMMUNITY): Payer: Medicare HMO | Admitting: Certified Registered Nurse Anesthetist

## 2016-03-10 ENCOUNTER — Ambulatory Visit (HOSPITAL_COMMUNITY): Payer: Medicare HMO | Admitting: Vascular Surgery

## 2016-03-10 DIAGNOSIS — I2510 Atherosclerotic heart disease of native coronary artery without angina pectoris: Secondary | ICD-10-CM | POA: Insufficient documentation

## 2016-03-10 DIAGNOSIS — J322 Chronic ethmoidal sinusitis: Secondary | ICD-10-CM | POA: Diagnosis not present

## 2016-03-10 DIAGNOSIS — J338 Other polyp of sinus: Secondary | ICD-10-CM | POA: Diagnosis not present

## 2016-03-10 DIAGNOSIS — K219 Gastro-esophageal reflux disease without esophagitis: Secondary | ICD-10-CM | POA: Insufficient documentation

## 2016-03-10 DIAGNOSIS — E7800 Pure hypercholesterolemia, unspecified: Secondary | ICD-10-CM | POA: Insufficient documentation

## 2016-03-10 DIAGNOSIS — K649 Unspecified hemorrhoids: Secondary | ICD-10-CM | POA: Diagnosis not present

## 2016-03-10 DIAGNOSIS — M1990 Unspecified osteoarthritis, unspecified site: Secondary | ICD-10-CM | POA: Insufficient documentation

## 2016-03-10 DIAGNOSIS — Z885 Allergy status to narcotic agent status: Secondary | ICD-10-CM | POA: Insufficient documentation

## 2016-03-10 DIAGNOSIS — J339 Nasal polyp, unspecified: Secondary | ICD-10-CM

## 2016-03-10 DIAGNOSIS — Z87891 Personal history of nicotine dependence: Secondary | ICD-10-CM | POA: Diagnosis not present

## 2016-03-10 DIAGNOSIS — I10 Essential (primary) hypertension: Secondary | ICD-10-CM | POA: Insufficient documentation

## 2016-03-10 DIAGNOSIS — Z955 Presence of coronary angioplasty implant and graft: Secondary | ICD-10-CM | POA: Insufficient documentation

## 2016-03-10 DIAGNOSIS — J321 Chronic frontal sinusitis: Secondary | ICD-10-CM | POA: Diagnosis not present

## 2016-03-10 DIAGNOSIS — G4730 Sleep apnea, unspecified: Secondary | ICD-10-CM | POA: Diagnosis not present

## 2016-03-10 DIAGNOSIS — J329 Chronic sinusitis, unspecified: Secondary | ICD-10-CM | POA: Diagnosis not present

## 2016-03-10 DIAGNOSIS — Z8719 Personal history of other diseases of the digestive system: Secondary | ICD-10-CM | POA: Insufficient documentation

## 2016-03-10 DIAGNOSIS — H409 Unspecified glaucoma: Secondary | ICD-10-CM | POA: Insufficient documentation

## 2016-03-10 DIAGNOSIS — J320 Chronic maxillary sinusitis: Secondary | ICD-10-CM | POA: Diagnosis not present

## 2016-03-10 DIAGNOSIS — G4733 Obstructive sleep apnea (adult) (pediatric): Secondary | ICD-10-CM | POA: Diagnosis not present

## 2016-03-10 DIAGNOSIS — J330 Polyp of nasal cavity: Secondary | ICD-10-CM | POA: Diagnosis not present

## 2016-03-10 DIAGNOSIS — Z85820 Personal history of malignant melanoma of skin: Secondary | ICD-10-CM | POA: Insufficient documentation

## 2016-03-10 HISTORY — PX: POLYPECTOMY: SHX149

## 2016-03-10 HISTORY — PX: SINUS ENDO WITH FUSION: SHX5329

## 2016-03-10 SURGERY — SURGERY, PARANASAL SINUS, ENDOSCOPIC, WITH NASAL SEPTOPLASTY, TURBINOPLASTY, AND MAXILLARY SINUSOTOMY
Anesthesia: General | Site: Nose

## 2016-03-10 MED ORDER — FENTANYL CITRATE (PF) 100 MCG/2ML IJ SOLN
INTRAMUSCULAR | Status: AC
  Filled 2016-03-10: qty 2

## 2016-03-10 MED ORDER — OXYMETAZOLINE HCL 0.05 % NA SOLN
NASAL | Status: AC
  Filled 2016-03-10: qty 15

## 2016-03-10 MED ORDER — DEXAMETHASONE SODIUM PHOSPHATE 10 MG/ML IJ SOLN
10.0000 mg | Freq: Once | INTRAMUSCULAR | Status: AC
  Administered 2016-03-10: 10 mg via INTRAVENOUS
  Filled 2016-03-10: qty 1

## 2016-03-10 MED ORDER — LACTATED RINGERS IV SOLN
INTRAVENOUS | Status: DC | PRN
  Administered 2016-03-10 (×2): via INTRAVENOUS

## 2016-03-10 MED ORDER — TRIAMCINOLONE ACETONIDE 40 MG/ML IJ SUSP
TOPICAL_CREAM | INTRAMUSCULAR | Status: DC | PRN
  Administered 2016-03-10: 11:00:00 via TOPICAL

## 2016-03-10 MED ORDER — ONDANSETRON HCL 4 MG/2ML IJ SOLN
INTRAMUSCULAR | Status: DC | PRN
  Administered 2016-03-10: 4 mg via INTRAVENOUS

## 2016-03-10 MED ORDER — ARTIFICIAL TEARS OP OINT
TOPICAL_OINTMENT | OPHTHALMIC | Status: DC | PRN
  Administered 2016-03-10: 1 via OPHTHALMIC

## 2016-03-10 MED ORDER — OXYMETAZOLINE HCL 0.05 % NA SOLN
1.0000 | Freq: Two times a day (BID) | NASAL | Status: DC
  Administered 2016-03-10: 1 via NASAL
  Filled 2016-03-10: qty 15

## 2016-03-10 MED ORDER — AMOXICILLIN-POT CLAVULANATE 500-125 MG PO TABS: 1.0000 | ORAL_TABLET | Freq: Two times a day (BID) | ORAL | 0 refills | Status: DC

## 2016-03-10 MED ORDER — ARTIFICIAL TEARS OP OINT
TOPICAL_OINTMENT | OPHTHALMIC | Status: AC
  Filled 2016-03-10: qty 3.5

## 2016-03-10 MED ORDER — EPHEDRINE SULFATE-NACL 50-0.9 MG/10ML-% IV SOSY
PREFILLED_SYRINGE | INTRAVENOUS | Status: DC | PRN
  Administered 2016-03-10 (×2): 10 mg via INTRAVENOUS

## 2016-03-10 MED ORDER — OXYMETAZOLINE HCL 0.05 % NA SOLN
NASAL | Status: DC | PRN
  Administered 2016-03-10: 1

## 2016-03-10 MED ORDER — SUGAMMADEX SODIUM 200 MG/2ML IV SOLN
INTRAVENOUS | Status: DC | PRN
  Administered 2016-03-10: 200 mg via INTRAVENOUS

## 2016-03-10 MED ORDER — PROPOFOL 10 MG/ML IV BOLUS
INTRAVENOUS | Status: AC
  Filled 2016-03-10: qty 20

## 2016-03-10 MED ORDER — MUPIROCIN 2 % EX OINT
TOPICAL_OINTMENT | CUTANEOUS | Status: AC
  Filled 2016-03-10: qty 22

## 2016-03-10 MED ORDER — MIDAZOLAM HCL 5 MG/5ML IJ SOLN
INTRAMUSCULAR | Status: DC | PRN
  Administered 2016-03-10 (×2): 1 mg via INTRAVENOUS

## 2016-03-10 MED ORDER — MIDAZOLAM HCL 2 MG/2ML IJ SOLN
INTRAMUSCULAR | Status: AC
  Filled 2016-03-10: qty 2

## 2016-03-10 MED ORDER — HYDROCODONE-ACETAMINOPHEN 5-325 MG PO TABS: 1.0000 | ORAL_TABLET | Freq: Four times a day (QID) | ORAL | 0 refills | Status: DC | PRN

## 2016-03-10 MED ORDER — CEFAZOLIN SODIUM-DEXTROSE 2-4 GM/100ML-% IV SOLN
2.0000 g | INTRAVENOUS | Status: AC
  Administered 2016-03-10: 2 g via INTRAVENOUS
  Filled 2016-03-10: qty 100

## 2016-03-10 MED ORDER — PROPOFOL 10 MG/ML IV BOLUS
INTRAVENOUS | Status: DC | PRN
  Administered 2016-03-10: 110 mg via INTRAVENOUS

## 2016-03-10 MED ORDER — LACTATED RINGERS IV SOLN
Freq: Once | INTRAVENOUS | Status: AC
  Administered 2016-03-10: 08:00:00 via INTRAVENOUS

## 2016-03-10 MED ORDER — CHLORHEXIDINE GLUCONATE CLOTH 2 % EX PADS: 6.0000 | MEDICATED_PAD | Freq: Once | CUTANEOUS | Status: DC

## 2016-03-10 MED ORDER — LIDOCAINE-EPINEPHRINE (PF) 1 %-1:200000 IJ SOLN
INTRAMUSCULAR | Status: DC | PRN
  Administered 2016-03-10: 3 mL

## 2016-03-10 MED ORDER — PHENYLEPHRINE HCL 10 MG/ML IJ SOLN
INTRAMUSCULAR | Status: AC
  Filled 2016-03-10: qty 1

## 2016-03-10 MED ORDER — ROCURONIUM BROMIDE 100 MG/10ML IV SOLN
INTRAVENOUS | Status: DC | PRN
  Administered 2016-03-10: 50 mg via INTRAVENOUS

## 2016-03-10 MED ORDER — LIDOCAINE-EPINEPHRINE (PF) 1 %-1:200000 IJ SOLN
INTRAMUSCULAR | Status: AC
  Filled 2016-03-10: qty 30

## 2016-03-10 MED ORDER — SODIUM CHLORIDE 0.9 % IR SOLN
Status: DC | PRN
  Administered 2016-03-10: 1

## 2016-03-10 MED ORDER — LIDOCAINE HCL (CARDIAC) 20 MG/ML IV SOLN
INTRAVENOUS | Status: DC | PRN
  Administered 2016-03-10: 100 mg via INTRAVENOUS

## 2016-03-10 MED ORDER — FENTANYL CITRATE (PF) 100 MCG/2ML IJ SOLN
INTRAMUSCULAR | Status: DC | PRN
  Administered 2016-03-10: 100 ug via INTRAVENOUS
  Administered 2016-03-10: 50 ug via INTRAVENOUS

## 2016-03-10 MED ORDER — TRIAMCINOLONE ACETONIDE 40 MG/ML IJ SUSP
INTRAMUSCULAR | Status: AC
  Filled 2016-03-10: qty 5

## 2016-03-10 SURGICAL SUPPLY — 58 items
ATTRACTOMAT 16X20 MAGNETIC DRP (DRAPES) IMPLANT
BLADE RAD40 ROTATE 4M 4 5PK (BLADE) IMPLANT
BLADE RAD40 ROTATE 4M 4MM 5PK (BLADE)
BLADE RAD60 ROTATE M4 4 5PK (BLADE) IMPLANT
BLADE RAD60 ROTATE M4 4MM 5PK (BLADE)
BLADE ROTATE RAD 40 4 M4 (BLADE) ×1 IMPLANT
BLADE ROTATE RAD 40 4MM M4 (BLADE) ×1
BLADE ROTATE TRICUT 4MX13CM M4 (BLADE) ×1
BLADE ROTATE TRICUT 4X13 M4 (BLADE) ×1 IMPLANT
BLADE SURG 15 STRL LF DISP TIS (BLADE) IMPLANT
BLADE SURG 15 STRL SS (BLADE)
BLADE TRICUT ROTATE M4 4 5PK (BLADE) ×3 IMPLANT
BLADE TRICUT ROTATE M4 4MM 5PK (BLADE) ×1
CANISTER SUCTION 2500CC (MISCELLANEOUS) ×8 IMPLANT
COAGULATOR SUCT 6 FR SWTCH (ELECTROSURGICAL)
COAGULATOR SUCT SWTCH 10FR 6 (ELECTROSURGICAL) IMPLANT
DRAPE PROXIMA HALF (DRAPES) ×2 IMPLANT
DRESSING NASAL KENNEDY 3.5X.9 (MISCELLANEOUS) IMPLANT
DRESSING TELFA 8X3 (GAUZE/BANDAGES/DRESSINGS) IMPLANT
DRSG NASAL KENNEDY 3.5X.9 (MISCELLANEOUS)
ELECT COATED BLADE 2.86 ST (ELECTRODE) IMPLANT
ELECT REM PT RETURN 9FT ADLT (ELECTROSURGICAL) ×4
ELECTRODE REM PT RTRN 9FT ADLT (ELECTROSURGICAL) ×2 IMPLANT
FILTER ARTHROSCOPY CONVERTOR (FILTER) ×8 IMPLANT
FLUID NSS /IRRIG 1000 ML XXX (MISCELLANEOUS) ×4 IMPLANT
GLOVE BIOGEL M 7.0 STRL (GLOVE) ×8 IMPLANT
GOWN STRL REUS W/ TWL LRG LVL3 (GOWN DISPOSABLE) ×4 IMPLANT
GOWN STRL REUS W/TWL LRG LVL3 (GOWN DISPOSABLE) ×8
KIT BASIN OR (CUSTOM PROCEDURE TRAY) ×4 IMPLANT
KIT ROOM TURNOVER OR (KITS) ×4 IMPLANT
NDL 18GX1X1/2 (RX/OR ONLY) (NEEDLE) IMPLANT
NDL HYPO 25GX1X1/2 BEV (NEEDLE) IMPLANT
NEEDLE 18GX1X1/2 (RX/OR ONLY) (NEEDLE) IMPLANT
NEEDLE HYPO 25GX1X1/2 BEV (NEEDLE) IMPLANT
NS IRRIG 1000ML POUR BTL (IV SOLUTION) ×4 IMPLANT
PAD ARMBOARD 7.5X6 YLW CONV (MISCELLANEOUS) ×8 IMPLANT
PAD ENT ADHESIVE 25PK (MISCELLANEOUS) ×4 IMPLANT
PENCIL BUTTON HOLSTER BLD 10FT (ELECTRODE) IMPLANT
SPECIMEN JAR SMALL (MISCELLANEOUS) ×8 IMPLANT
SPONGE NEURO XRAY DETECT 1X3 (DISPOSABLE) ×4 IMPLANT
SUT ETHILON 3 0 FSL (SUTURE) IMPLANT
SUT ETHILON 3 0 PS 1 (SUTURE) IMPLANT
SUT PLAIN 4 0 ~~LOC~~ 1 (SUTURE) IMPLANT
SWAB COLLECTION DEVICE MRSA (MISCELLANEOUS) IMPLANT
SYR 50ML SLIP (SYRINGE) IMPLANT
SYR CONTROL 10ML LL (SYRINGE) ×4 IMPLANT
TOWEL OR 17X24 6PK STRL BLUE (TOWEL DISPOSABLE) ×4 IMPLANT
TRACKER ENT INSTRUMENT (MISCELLANEOUS) ×4 IMPLANT
TRACKER ENT PATIENT (MISCELLANEOUS) ×4 IMPLANT
TRAP SPECIMEN MUCOUS 40CC (MISCELLANEOUS) IMPLANT
TRAY ENT MC OR (CUSTOM PROCEDURE TRAY) ×4 IMPLANT
TUBE ANAEROBIC SPECIMEN COL (MISCELLANEOUS) IMPLANT
TUBE CONNECTING 12'X1/4 (SUCTIONS) ×1
TUBE CONNECTING 12X1/4 (SUCTIONS) ×3 IMPLANT
TUBING EXTENTION W/L.L. (IV SETS) ×4 IMPLANT
TUBING STRAIGHTSHOT EPS 5PK (TUBING) ×4 IMPLANT
WATER STERILE IRR 1000ML POUR (IV SOLUTION) ×4 IMPLANT
WIPE INSTRUMENT VISIWIPE 73X73 (MISCELLANEOUS) ×4 IMPLANT

## 2016-03-10 NOTE — Anesthesia Preprocedure Evaluation (Signed)
Anesthesia Evaluation  Patient identified by MRN, date of birth, ID band Patient awake    Reviewed: Allergy & Precautions, NPO status , Patient's Chart, lab work & pertinent test results  Airway Mallampati: I  TM Distance: >3 FB Neck ROM: Full    Dental   Pulmonary sleep apnea , former smoker,    Pulmonary exam normal        Cardiovascular hypertension, Pt. on medications + CAD and + Cardiac Stents  Normal cardiovascular exam     Neuro/Psych    GI/Hepatic GERD  Controlled,  Endo/Other    Renal/GU      Musculoskeletal   Abdominal   Peds  Hematology   Anesthesia Other Findings   Reproductive/Obstetrics                             Anesthesia Physical Anesthesia Plan  ASA: III  Anesthesia Plan: General   Post-op Pain Management:    Induction: Intravenous  Airway Management Planned: Oral ETT  Additional Equipment:   Intra-op Plan:   Post-operative Plan: Extubation in OR  Informed Consent: I have reviewed the patients History and Physical, chart, labs and discussed the procedure including the risks, benefits and alternatives for the proposed anesthesia with the patient or authorized representative who has indicated his/her understanding and acceptance.     Plan Discussed with: CRNA and Surgeon  Anesthesia Plan Comments:         Anesthesia Quick Evaluation

## 2016-03-10 NOTE — Brief Op Note (Signed)
03/10/2016  11:07 AM  PATIENT:  Christopher Acevedo  76 y.o. male  PRE-OPERATIVE DIAGNOSIS:  NASAL SINUS POLYPS  POST-OPERATIVE DIAGNOSIS:  NASAL SINUS POLYPS  PROCEDURE:  Procedure(s): BILATERAL REVISION SINUS ENDOSCOPIC WITH FUSION (Bilateral) POLYPECTOMY NASAL (N/A)  SURGEON:  Surgeon(s) and Role:    * Jerrell Belfast, MD - Primary  PHYSICIAN ASSISTANT:   ASSISTANTS: none   ANESTHESIA:   general  EBL:  Total I/O In: 1000 [I.V.:1000] Out: 150 [Blood:150]  BLOOD ADMINISTERED:none  DRAINS: none   LOCAL MEDICATIONS USED:  LIDOCAINE  and Amount: 3 ml  SPECIMEN:  Source of Specimen:  sinus contents  DISPOSITION OF SPECIMEN:  PATHOLOGY  COUNTS:  YES  TOURNIQUET:  * No tourniquets in log *  DICTATION: .Other Dictation: Dictation Number [id number]  PLAN OF CARE: Discharge to home after PACU  PATIENT DISPOSITION:  PACU - hemodynamically stable.   Delay start of Pharmacological VTE agent (>24hrs) due to surgical blood loss or risk of bleeding: not applicable

## 2016-03-10 NOTE — Anesthesia Procedure Notes (Signed)
Procedure Name: Intubation Date/Time: 03/10/2016 9:32 AM Performed by: Candis Shine Pre-anesthesia Checklist: Patient identified, Emergency Drugs available, Suction available and Patient being monitored Patient Re-evaluated:Patient Re-evaluated prior to inductionOxygen Delivery Method: Circle System Utilized Preoxygenation: Pre-oxygenation with 100% oxygen Intubation Type: IV induction Ventilation: Mask ventilation without difficulty and Oral airway inserted - appropriate to patient size Laryngoscope Size: Mac and 4 Grade View: Grade I Tube type: Oral Tube size: 7.5 mm Number of attempts: 1 Airway Equipment and Method: Stylet and Oral airway Placement Confirmation: ETT inserted through vocal cords under direct vision,  positive ETCO2 and breath sounds checked- equal and bilateral Secured at: 23 cm Tube secured with: Tape Dental Injury: Teeth and Oropharynx as per pre-operative assessment

## 2016-03-10 NOTE — H&P (Signed)
Christopher Acevedo is an 76 y.o. male.   Chief Complaint: Chronic Nasal Polyposis HPI: Prior ESS, hx of progressive Chronic Nasal Polyposis  Past Medical History:  Diagnosis Date  . Actinic keratosis    DRY SKIN WITH LESIONS- MOSTLY ON FEET AND LOWER LEGS  . Arthritis   . Cancer (HCC)    nose basal cell carcinoma, neck melanoma  . Chronic sinusitis    nasal polyp  . Diverticulosis    LOWER ABD PAIN AND DIARRHEA - MOST RECENT EPISODE WAS 11/25/13- BETTER TODAY  . Dry eyes   . Elevated cholesterol   . Environmental and seasonal allergies   . GERD (gastroesophageal reflux disease)   . Glaucoma    LEFT EYE  . Hearing loss    BOTH EARS- HAS HEARING AIDS  . Hypertension   . Sleep apnea    USES CPAP MOST NIGHTS  . Spasm of eyelids    PT [facility]    Past Surgical History:  Procedure Laterality Date  . BLEPHROPLASTY Bilateral   . COLONOSCOPY W/ BIOPSIES AND POLYPECTOMY    . HERNIA REPAIR  1990s?  Marland Kitchen NASAL SINUS SURGERY    . RIGHT SHOULDER SURGERY    . RIGHT THUMB AND INDEX FINGER SURGERY FOR ARTHRITIS    . TRANSANAL HEMORRHOIDAL DEARTERIALIZATION N/A 12/02/2013   Procedure: TRANSANAL HEMORRHOIDAL DEARTERIALIZATION AND LIGATION/PEXY, EXTERNAL HEMORRHOIDECTOMY, REMOVAL OF GLUTEAL MASS;  Surgeon: Michael Boston, MD;  Location: [facility];  Service: General;  Laterality: N/A;    Family History  Problem Relation Age of Onset  . Rheum arthritis Mother   . Allergic rhinitis Neg Hx   . Angioedema Neg Hx   . Asthma Neg Hx   . Atopy Neg Hx   . Eczema Neg Hx   . Immunodeficiency Neg Hx   . Urticaria Neg Hx    Social History:  reports that he quit smoking about 12 years ago. His smoking use included Cigarettes. He has a 35.00 pack-year smoking history. He has quit using smokeless tobacco. His smokeless tobacco use included Snuff and Chew. He reports that he drinks about 2.4 oz of alcohol per week . He reports that he does not use  drugs.  Allergies:  Allergies  Allergen Reactions  . Codeine Nausea And Vomiting    FELT FAINT    Facility-Administered Medications Prior to Admission  Medication Dose Route Frequency Provider Last Rate Last Dose  . predniSONE (DELTASONE) tablet 10 mg  10 mg Oral UD Adelina Mings, MD       Medications Prior to Admission  Medication Sig Dispense Refill  . amLODipine (NORVASC) 10 MG tablet Take 5 mg by mouth daily.     Marland Kitchen atorvastatin (LIPITOR) 40 MG tablet Take 20 mg by mouth daily at 6 PM. Takes 1/2 tablet daily     . Azelastine-Fluticasone 137-50 MCG/ACT SUSP Use 1 spray twice daily for congestion. 1 Bottle 2  . Carboxymeth-Glycerin-Polysorb (REFRESH OPTIVE ADVANCED) 0.5-1-0.5 % SOLN Place 1 drop into both eyes 2 (two) times daily as needed (dry eyes). Reported on 02/16/2015    . Coenzyme Q10 (CO Q 10 PO) Take 1 tablet by mouth daily.    . cycloSPORINE (RESTASIS) 0.05 % ophthalmic emulsion Place 1 drop into both eyes 2 (two) times daily.    . fluorometholone (FML) 0.1 % ophthalmic suspension Place 1 drop into both eyes 2 (two) times daily as needed (excessive blinking.).    Marland Kitchen  ibuprofen (ADVIL,MOTRIN) 200 MG tablet Take 200 mg by mouth every 6 (six) hours as needed for mild pain.    Marland Kitchen KRILL OIL PO Take 1 tablet by mouth every morning.    . lanolin-mineral oil (KERI) LOTN Apply 1 application topically 3 (three) times a week. As needed for dry patches on skin.    Marland Kitchen latanoprost (XALATAN) 0.005 % ophthalmic solution Place 1 drop into the left eye at bedtime.    Marland Kitchen MAGNESIUM CITRATE PO Take 1 tablet by mouth daily.    Marland Kitchen omeprazole (PRILOSEC) 20 MG capsule Take 20 mg by mouth every morning.     Marland Kitchen PRESCRIPTION MEDICATION Apply 1 application topically 3 (three) times a week. As needed for dry patches on skin. (Vaseline for men only lotion.)    . traMADol (ULTRAM) 50 MG tablet Take 50 mg by mouth every 12 (twelve) hours as needed for moderate pain.     . cholecalciferol (VITAMIN D) 1000  UNITS tablet Take 1,000 Units by mouth every morning. Reported on 07/08/2015    . fluticasone (FLONASE) 50 MCG/ACT nasal spray USE 1 SPRAY IN EACH NOSTRIL ONCE A DAY  5  . OnabotulinumtoxinA (BOTOX IJ) Inject 1 each as directed every 6 (six) months. Botox injection all the way around both eyes.      No results found for this or any previous visit (from the past 48 hour(s)). No results found.  Review of Systems  Constitutional: Negative.   HENT: Positive for congestion.   Respiratory: Negative.   Cardiovascular: Negative.     Blood pressure (!) 156/88, pulse 60, temperature 98.2 F (36.8 C), temperature source Oral, resp. rate 20, SpO2 97 %. Physical Exam  Constitutional: He appears well-developed and well-nourished.  HENT:  Nasal polypectomy  Neck: Normal range of motion. Neck supple.  Cardiovascular: Normal rate.   Respiratory: Effort normal.     Assessment/Plan Adm for OP ESS  Edmund Rick, MD 03/10/2016, 8:53 AM

## 2016-03-10 NOTE — Anesthesia Postprocedure Evaluation (Signed)
Anesthesia Post Note  Patient: Christopher Acevedo  Procedure(s) Performed: Procedure(s) (LRB): BILATERAL REVISION SINUS ENDOSCOPIC WITH FUSION (Bilateral) POLYPECTOMY NASAL (N/A)  Patient location during evaluation: PACU Anesthesia Type: General Level of consciousness: awake and alert Pain management: pain level controlled Vital Signs Assessment: post-procedure vital signs reviewed and stable Respiratory status: spontaneous breathing, nonlabored ventilation, respiratory function stable and patient connected to nasal cannula oxygen Cardiovascular status: blood pressure returned to baseline and stable Postop Assessment: no signs of nausea or vomiting Anesthetic complications: no       Last Vitals:  Vitals:   03/10/16 1230 03/10/16 1245  BP: (!) 143/88 131/85  Pulse: 63 68  Resp: 16 18  Temp: 36.6 C     Last Pain:  Vitals:   03/10/16 1245  TempSrc:   PainSc: 0-No pain                 [name] DAVID

## 2016-03-10 NOTE — Transfer of Care (Signed)
Immediate Anesthesia Transfer of Care Note  Patient: Christopher Acevedo  Procedure(s) Performed: Procedure(s): BILATERAL REVISION SINUS ENDOSCOPIC WITH FUSION (Bilateral) POLYPECTOMY NASAL (N/A)  Patient Location: PACU  Anesthesia Type:General  Level of Consciousness: awake, alert  and oriented  Airway & Oxygen Therapy: Patient Spontanous Breathing and Patient connected to face mask oxygen  Post-op Assessment: Report given to RN and Post -op Vital signs reviewed and stable  Post vital signs: Reviewed and stable  Last Vitals:  Vitals:   03/10/16 0707  BP: (!) 156/88  Pulse: 60  Resp: 20  Temp: 36.8 C    Last Pain:  Vitals:   03/10/16 0707  TempSrc: Oral         Complications: No apparent anesthesia complications

## 2016-03-11 ENCOUNTER — Encounter (HOSPITAL_COMMUNITY): Payer: Self-pay | Admitting: Otolaryngology

## 2016-03-13 NOTE — Op Note (Signed)
Christopher Acevedo, Christopher Acevedo               ACCOUNT NO.:  [id number]  MEDICAL RECORD NO.:  [id number]  LOCATION:                                 FACILITY:  PHYSICIAN:  Early Chars. Wilburn Cornelia, M.D.    DATE OF BIRTH:  DATE OF PROCEDURE: DATE OF DISCHARGE:                              OPERATIVE REPORT   LOCATION:  [facility] Main OR.  PREOPERATIVE DIAGNOSES: 1. Chronic nasal polyposis. 2. Status post previous nasal polypectomy. 3. History of obstructive sleep apnea.  POSTOPERATIVE DIAGNOSES: 1. Chronic nasal polyposis. 2. Status post previous nasal polypectomy. 3. History of obstructive sleep apnea.  INDICATION FOR SURGERY: 1. Chronic nasal polyposis. 2. Status post previous nasal polypectomy. 3. History of obstructive sleep apnea.  SURGICAL PROCEDURES:  Bilateral revision endoscopic sinus surgery with intraoperative computer-assisted navigation (Fusion) consisting of bilateral total ethmoidectomies, bilateral maxillary antrostomies, removal of diseased tissue, bilateral nasal frontal recess exploration, bilateral nasal polypectomy.  ANESTHESIA:  General endotracheal.  SURGEON:  Early Chars. Wilburn Cornelia, M.D.  COMPLICATIONS:  None.  ESTIMATED BLOOD LOSS:  150 mL.  CONDITION:  The patient transferred from the operating room to the recovery room in stable condition.  FINDINGS:  Extensive sinonasal polyps bilaterally with obstruction of the nasal passageway and nasal choana, extensive sinus polypoid disease in the ethmoid, maxillary and frontal sinuses.  The patient's sinuses were instilled with a 50:50 mix of Kenalog 40 and Bactroban cream in all six sinuses.  No packing was placed.  The patient was transferred from to the operating room to the recovery room in stable condition.  BRIEF HISTORY:  The patient is a 76 year old male, referred to our office with a history of progressive nasal airway obstruction.  He has a history of nasal polyposis and underwent previous sinonasal  surgery in the 1980s, for chronic nasal polyps.  The patient has had progressive symptoms of nasal congestion, which did not respond to appropriate medical therapy.  He has history of obstructive sleep apnea having difficulty wearing his CPAP device because of nasal airway obstruction. Given the patient's history and findings which showed significant extensive bilateral nasal polyposis, he was treated with topical and oral steroids and oral antibiotics.  A followup CT scan was performed and this showed extensive soft tissue mucosal thickening involving the ethmoid, maxillary and frontal sinuses with soft tissue disease within the nasal passageway consistent with his history of nasal polyps.  Given the patient's history and findings, I recommended bilateral revision endoscopic sinus surgery and nasal polypectomy.  The risks and benefits of the procedure were discussed in detail with the patient and his wife and they understood and agreed with our plan for surgery which was scheduled on elective basis at [facility].  DESCRIPTION OF PROCEDURE:  The patient was brought to the operating room on March 10, 2016, and placed in supine position on the operating table.  General endotracheal anesthesia was established without difficulty and the patient was adequately anesthetized, he was positioned, prepped and draped.  A surgical time-out was then performed with correct identification of the patient and the surgical procedure. The patient's nose was then injected with a total of 3 mL of 1% lidocaine  with 1:100,000 dilution epinephrine, which injected in a submucosal fashion along the lateral nasal wall, middle turbinates and intranasal polyps.  His nose was then packed with Afrin-soaked cottonoid pledgets, were left in place for approximately 10 minutes, allowed for vasoconstriction and hemostasis.  The Xomed Fusion headgear was applied, and anatomic and surgical landmarks were  identified and confirmed.  The Fusion device was used throughout the sinus surgery.  With the patient prepped, draped and prepared, endoscopic sinus surgery was begun on the patient's right-hand side using a 0-degree telescope, straight microdebrider with navigation, polypoid disease was resected from within the nasal passageway.  The patient had extensive polyps, which extended from the middle meatus into the nasopharynx on the right. This polypoid material was completely resected with the microdebrider and dissection was taken to the level of the anterior ethmoid region. With nasal polypectomy completed on the right, endoscopic sinus surgery was begun using a 0-degree endoscope, a total ethmoidectomy was performed resecting polypoid mucosa and bony septations creating a widely patent ethmoid cavity.  The posterior aspect of the ethmoid was identified and a 45-degree telescope was used to dissect from posterior to anterior along the roof of the ethmoid.  This was carried into the nasofrontal region with resection of polypoid disease and extension into the frontal sinuses.  The nasal frontal recess was widely patent. Attention was then turned to the lateral nasal wall where the natural ostium of the maxillary sinus was completely occluded with polypoid disease.  Using a through-cutting forceps and a curved microdebrider, the natural ostium was widely enlarged and polypoid material was resected from within the right maxillary sinus.  Attention was then turned to the patient's left hand side.  Again, nasal polypectomy was begun using a 0-degree endoscope and a straight microdebrider, resecting polyps from within the middle and superior meatus and nasal cavity including the left nasal choana.  A total ethmoidectomy was then performed by dissecting from anterior to posterior along the floor of the ethmoid sinus removing polypoid material and bony septations.  The superior aspect of the  posterior ethmoid was identified, and a curved microdebrider and navigation were used along the roof of the ethmoid sinus, extending from posterior to anterior and entering the nasal frontal recess, which was occluded with underlying polypoid disease.  This was fully resected and the ostium was widely patent at the conclusion of the procedure.  Attention was then turned to the lateral nasal wall where the natural ostium of the maxillary sinus was identified and enlarged.  There was polypoid material filling the sinus, which was resected, creating a widely patent sinus ostium.  With the sinus surgery completed, sponge count was correct.  The patient's sinuses were inspected.  There were several areas of bleeding, which were cauterized with monopolar suction cautery.  The patient's sinuses were then filled with a 50:50 mix of Kenalog 40 and Bactroban cream, which was instilled into all six anterior sinuses.  The patient's nasopharynx was irrigated and suctioned.  Orogastric tube was passed.  Stomach contents were aspirated.  The patient was then awakened from his anesthetic.  He was extubated and transferred from the operating room to the recovery room in stable condition.    ______________________________ Early Chars Wilburn Cornelia, M.D.   ______________________________ Early Chars. Wilburn Cornelia, M.D.    DLS/MEDQ  D:  [id number]  T:  03/11/2016  Job:  [id number]

## 2016-04-13 DIAGNOSIS — G4733 Obstructive sleep apnea (adult) (pediatric): Secondary | ICD-10-CM | POA: Diagnosis not present

## 2016-07-21 DIAGNOSIS — G4733 Obstructive sleep apnea (adult) (pediatric): Secondary | ICD-10-CM | POA: Diagnosis not present

## 2016-08-07 DIAGNOSIS — J302 Other seasonal allergic rhinitis: Secondary | ICD-10-CM | POA: Diagnosis not present

## 2016-08-07 DIAGNOSIS — R51 Headache: Secondary | ICD-10-CM | POA: Diagnosis not present

## 2016-08-31 DIAGNOSIS — D225 Melanocytic nevi of trunk: Secondary | ICD-10-CM | POA: Diagnosis not present

## 2016-08-31 DIAGNOSIS — L814 Other melanin hyperpigmentation: Secondary | ICD-10-CM | POA: Diagnosis not present

## 2016-08-31 DIAGNOSIS — L821 Other seborrheic keratosis: Secondary | ICD-10-CM | POA: Diagnosis not present

## 2016-08-31 DIAGNOSIS — Z85828 Personal history of other malignant neoplasm of skin: Secondary | ICD-10-CM | POA: Diagnosis not present

## 2016-08-31 DIAGNOSIS — Z85820 Personal history of malignant melanoma of skin: Secondary | ICD-10-CM | POA: Diagnosis not present

## 2016-08-31 DIAGNOSIS — L570 Actinic keratosis: Secondary | ICD-10-CM | POA: Diagnosis not present

## 2016-09-13 DIAGNOSIS — K648 Other hemorrhoids: Secondary | ICD-10-CM | POA: Diagnosis not present

## 2016-09-13 DIAGNOSIS — Z86010 Personal history of colonic polyps: Secondary | ICD-10-CM | POA: Diagnosis not present

## 2016-09-13 DIAGNOSIS — K649 Unspecified hemorrhoids: Secondary | ICD-10-CM | POA: Diagnosis not present

## 2016-09-13 DIAGNOSIS — D126 Benign neoplasm of colon, unspecified: Secondary | ICD-10-CM | POA: Diagnosis not present

## 2016-09-13 DIAGNOSIS — K5730 Diverticulosis of large intestine without perforation or abscess without bleeding: Secondary | ICD-10-CM | POA: Diagnosis not present

## 2016-09-13 DIAGNOSIS — K644 Residual hemorrhoidal skin tags: Secondary | ICD-10-CM | POA: Diagnosis not present

## 2016-09-18 DIAGNOSIS — D126 Benign neoplasm of colon, unspecified: Secondary | ICD-10-CM | POA: Diagnosis not present

## 2016-12-01 DIAGNOSIS — J338 Other polyp of sinus: Secondary | ICD-10-CM | POA: Diagnosis not present

## 2016-12-01 DIAGNOSIS — J310 Chronic rhinitis: Secondary | ICD-10-CM | POA: Diagnosis not present

## 2016-12-01 DIAGNOSIS — J329 Chronic sinusitis, unspecified: Secondary | ICD-10-CM | POA: Diagnosis not present

## 2016-12-27 DIAGNOSIS — H524 Presbyopia: Secondary | ICD-10-CM | POA: Diagnosis not present

## 2016-12-27 DIAGNOSIS — H2513 Age-related nuclear cataract, bilateral: Secondary | ICD-10-CM | POA: Diagnosis not present

## 2016-12-27 DIAGNOSIS — H40013 Open angle with borderline findings, low risk, bilateral: Secondary | ICD-10-CM | POA: Diagnosis not present

## 2017-01-08 DIAGNOSIS — Z0000 Encounter for general adult medical examination without abnormal findings: Secondary | ICD-10-CM | POA: Diagnosis not present

## 2017-01-08 DIAGNOSIS — G245 Blepharospasm: Secondary | ICD-10-CM | POA: Diagnosis not present

## 2017-01-08 DIAGNOSIS — K219 Gastro-esophageal reflux disease without esophagitis: Secondary | ICD-10-CM | POA: Diagnosis not present

## 2017-01-08 DIAGNOSIS — Z79899 Other long term (current) drug therapy: Secondary | ICD-10-CM | POA: Diagnosis not present

## 2017-01-08 DIAGNOSIS — N529 Male erectile dysfunction, unspecified: Secondary | ICD-10-CM | POA: Diagnosis not present

## 2017-01-08 DIAGNOSIS — E291 Testicular hypofunction: Secondary | ICD-10-CM | POA: Diagnosis not present

## 2017-01-08 DIAGNOSIS — Z6836 Body mass index (BMI) 36.0-36.9, adult: Secondary | ICD-10-CM | POA: Diagnosis not present

## 2017-01-08 DIAGNOSIS — G4730 Sleep apnea, unspecified: Secondary | ICD-10-CM | POA: Diagnosis not present

## 2017-01-08 DIAGNOSIS — I714 Abdominal aortic aneurysm, without rupture: Secondary | ICD-10-CM | POA: Diagnosis not present

## 2017-01-08 DIAGNOSIS — I10 Essential (primary) hypertension: Secondary | ICD-10-CM | POA: Diagnosis not present

## 2017-01-31 DIAGNOSIS — G4733 Obstructive sleep apnea (adult) (pediatric): Secondary | ICD-10-CM | POA: Diagnosis not present

## 2017-02-06 DIAGNOSIS — H25811 Combined forms of age-related cataract, right eye: Secondary | ICD-10-CM | POA: Diagnosis not present

## 2017-02-06 DIAGNOSIS — H2011 Chronic iridocyclitis, right eye: Secondary | ICD-10-CM | POA: Diagnosis not present

## 2017-02-06 DIAGNOSIS — H2511 Age-related nuclear cataract, right eye: Secondary | ICD-10-CM | POA: Diagnosis not present

## 2017-02-13 DIAGNOSIS — E291 Testicular hypofunction: Secondary | ICD-10-CM | POA: Diagnosis not present

## 2017-02-13 DIAGNOSIS — J329 Chronic sinusitis, unspecified: Secondary | ICD-10-CM | POA: Diagnosis not present

## 2017-02-20 DIAGNOSIS — E291 Testicular hypofunction: Secondary | ICD-10-CM | POA: Diagnosis not present

## 2017-02-27 DIAGNOSIS — E291 Testicular hypofunction: Secondary | ICD-10-CM | POA: Diagnosis not present

## 2017-02-28 DIAGNOSIS — R51 Headache: Secondary | ICD-10-CM | POA: Diagnosis not present

## 2017-02-28 DIAGNOSIS — J338 Other polyp of sinus: Secondary | ICD-10-CM | POA: Diagnosis not present

## 2017-03-21 DIAGNOSIS — Z85828 Personal history of other malignant neoplasm of skin: Secondary | ICD-10-CM | POA: Diagnosis not present

## 2017-03-21 DIAGNOSIS — L821 Other seborrheic keratosis: Secondary | ICD-10-CM | POA: Diagnosis not present

## 2017-03-21 DIAGNOSIS — D1801 Hemangioma of skin and subcutaneous tissue: Secondary | ICD-10-CM | POA: Diagnosis not present

## 2017-03-21 DIAGNOSIS — D229 Melanocytic nevi, unspecified: Secondary | ICD-10-CM | POA: Diagnosis not present

## 2017-03-21 DIAGNOSIS — L819 Disorder of pigmentation, unspecified: Secondary | ICD-10-CM | POA: Diagnosis not present

## 2017-03-21 DIAGNOSIS — Z85820 Personal history of malignant melanoma of skin: Secondary | ICD-10-CM | POA: Diagnosis not present

## 2017-03-21 DIAGNOSIS — L570 Actinic keratosis: Secondary | ICD-10-CM | POA: Diagnosis not present

## 2017-03-27 DIAGNOSIS — H25812 Combined forms of age-related cataract, left eye: Secondary | ICD-10-CM | POA: Diagnosis not present

## 2017-03-27 DIAGNOSIS — H2512 Age-related nuclear cataract, left eye: Secondary | ICD-10-CM | POA: Diagnosis not present

## 2017-07-12 DIAGNOSIS — S52572A Other intraarticular fracture of lower end of left radius, initial encounter for closed fracture: Secondary | ICD-10-CM | POA: Diagnosis not present

## 2017-08-08 DIAGNOSIS — S52572A Other intraarticular fracture of lower end of left radius, initial encounter for closed fracture: Secondary | ICD-10-CM | POA: Diagnosis not present

## 2017-08-22 ENCOUNTER — Ambulatory Visit (INDEPENDENT_AMBULATORY_CARE_PROVIDER_SITE_OTHER): Payer: Medicare HMO

## 2017-08-22 ENCOUNTER — Ambulatory Visit (INDEPENDENT_AMBULATORY_CARE_PROVIDER_SITE_OTHER): Payer: Medicare HMO | Admitting: Orthopedic Surgery

## 2017-08-22 ENCOUNTER — Encounter (INDEPENDENT_AMBULATORY_CARE_PROVIDER_SITE_OTHER): Payer: Self-pay | Admitting: Orthopedic Surgery

## 2017-08-22 DIAGNOSIS — M722 Plantar fascial fibromatosis: Secondary | ICD-10-CM | POA: Diagnosis not present

## 2017-08-22 DIAGNOSIS — M79671 Pain in right foot: Secondary | ICD-10-CM

## 2017-08-22 MED ORDER — METHYLPREDNISOLONE ACETATE 40 MG/ML IJ SUSP
40.0000 mg | INTRAMUSCULAR | Status: AC | PRN
  Administered 2017-08-22: 40 mg via INTRA_ARTICULAR

## 2017-08-22 MED ORDER — LIDOCAINE HCL 1 % IJ SOLN
1.0000 mL | INTRAMUSCULAR | Status: AC | PRN
  Administered 2017-08-22: 1 mL

## 2017-08-22 NOTE — Progress Notes (Signed)
Office Visit Note   Patient: Christopher Acevedo           Date of Birth: [birth year]           MRN: [id number] Visit Date: 08/22/2017              Requested by: Jonathon Jordan, MD [place], [place] [address] PCP: Jonathon Jordan, MD  Chief Complaint  Patient presents with  . Right Foot - Pain      HPI: The patient is a 77 year old gentleman seen today for evaluation of 4 month history of plantar heel pain. Pain with start up. Slightly better with supportive shoe wear. Has had injection for plantar fasciitis of the left heel.  Today complaining of right heel pain.  Does have some new supportive shoe wear on order.  On examination right heel  Assessment & Plan: Visit Diagnoses:  1. Plantar fasciitis, right   2. Pain of right heel     Plan: Plantar fascia injection without incident on the right.  To follow-up in the office As needed.  Discussed using anti-inflammatories heel cord stretching.  Supportive shoewear and orthotics.  Patient voiced understanding.  Follow-Up Instructions: Return in about 1 month (around 09/19/2017), or if symptoms worsen or fail to improve.   Ortho Exam  Patient is alert, oriented, no adenopathy, well-dressed, normal affect, normal respiratory effort. Point Tender to the  origin of the plantar fascia there is no erythema.  No pain with lateral heel squeeze.  Imaging: No results found. No images are attached to the encounter.  Labs: Lab Results  Component Value Date   REPTSTATUS 12/11/2010 FINAL 12/09/2010   CULT ENTEROCOCCUS SPECIES 12/09/2010   LABORGA ENTEROCOCCUS SPECIES 12/09/2010     Lab Results  Component Value Date   ALBUMIN 4.0 12/09/2010    There is no height or weight on file to calculate BMI.  Orders:  Orders Placed This Encounter  Procedures  . XR Foot 2 Views Right   No orders of the defined types were placed in this encounter.    Procedures: Small Joint Inj on 08/22/2017 3:37  PM Indications: pain Details: 22 G needle Medications: 40 mg methylPREDNISolone acetate 40 MG/ML; 1 mL lidocaine 1 % Consent was given by the patient.      Clinical Data: No additional findings.  ROS:  All other systems negative, except as noted in the HPI. Review of Systems  Constitutional: Negative for chills and fever.  Musculoskeletal: Positive for myalgias.    Objective: Vital Signs: There were no vitals taken for this visit.  Specialty Comments:  No specialty comments available.  PMFS History: Patient Active Problem List   Diagnosis Date Noted  . AAA (abdominal aortic aneurysm) without rupture ([place]) 07/10/2015  . Arthropathia 07/10/2015  . Blepharospasm 07/10/2015  . Arteriosclerosis of coronary artery 07/10/2015  . Hemorrhoids without complication 67/20/9470  . Essential (primary) hypertension 07/10/2015  . Agnogenic myeloid metaplasia ([place]) 07/10/2015  . Apnea, sleep 07/10/2015  . Fatty infiltration of liver 07/10/2015  . Nasal polyposis 02/16/2015  . Perennial and seasonal allergic rhinitis 02/16/2015   Past Medical History:  Diagnosis Date  . Actinic keratosis    DRY SKIN WITH LESIONS- MOSTLY ON FEET AND LOWER LEGS  . Arthritis   . Cancer (HCC)    nose basal cell carcinoma, neck melanoma  . Chronic sinusitis    nasal polyp  . Diverticulosis    LOWER ABD PAIN AND DIARRHEA - MOST RECENT  EPISODE WAS 11/25/13- BETTER TODAY  . Dry eyes   . Elevated cholesterol   . Environmental and seasonal allergies   . GERD (gastroesophageal reflux disease)   . Glaucoma    LEFT EYE  . Hearing loss    BOTH EARS- HAS HEARING AIDS  . Hypertension   . Sleep apnea    USES CPAP MOST NIGHTS  . Spasm of eyelids    [facility]    Family History  Problem Relation Age of Onset  . Rheum arthritis Mother   . Allergic rhinitis Neg Hx   . Angioedema Neg Hx   . Asthma Neg Hx   . Atopy Neg Hx   . Eczema Neg Hx    . Immunodeficiency Neg Hx   . Urticaria Neg Hx     Past Surgical History:  Procedure Laterality Date  . BLEPHROPLASTY Bilateral   . COLONOSCOPY W/ BIOPSIES AND POLYPECTOMY    . HERNIA REPAIR  1990s?  Marland Kitchen NASAL SINUS SURGERY    . POLYPECTOMY N/A 03/10/2016   Procedure: POLYPECTOMY NASAL;  Surgeon: Jerrell Belfast, MD;  Location: [facility];  Service: ENT;  Laterality: N/A;  . RIGHT SHOULDER SURGERY    . RIGHT THUMB AND INDEX FINGER SURGERY FOR ARTHRITIS    . SINUS ENDO WITH FUSION Bilateral 03/10/2016   Procedure: BILATERAL REVISION SINUS ENDOSCOPIC WITH FUSION;  Surgeon: Jerrell Belfast, MD;  Location: [facility];  Service: ENT;  Laterality: Bilateral;  . TRANSANAL HEMORRHOIDAL DEARTERIALIZATION N/A 12/02/2013   Procedure: TRANSANAL HEMORRHOIDAL DEARTERIALIZATION AND LIGATION/PEXY, EXTERNAL HEMORRHOIDECTOMY, REMOVAL OF GLUTEAL MASS;  Surgeon: Michael Boston, MD;  Location: [facility];  Service: General;  Laterality: N/A;   Social History   Occupational History  . Not on file  Tobacco Use  . Smoking status: Former Smoker    Packs/day: 1.00    Years: 35.00    Pack years: 35.00    Types: Cigarettes    Last attempt to quit: 01/24/2004    Years since quitting: 13.5  . Smokeless tobacco: Former Systems developer    Types: Snuff, Chew  Substance and Sexual Activity  . Alcohol use: Yes    Alcohol/week: 2.4 oz    Types: 4 Shots of liquor per week    Comment: 4 shots of liquor each week  . Drug use: No  . Sexual activity: Yes

## 2017-09-04 DIAGNOSIS — L821 Other seborrheic keratosis: Secondary | ICD-10-CM | POA: Diagnosis not present

## 2017-09-04 DIAGNOSIS — D225 Melanocytic nevi of trunk: Secondary | ICD-10-CM | POA: Diagnosis not present

## 2017-09-04 DIAGNOSIS — Z85820 Personal history of malignant melanoma of skin: Secondary | ICD-10-CM | POA: Diagnosis not present

## 2017-09-04 DIAGNOSIS — L570 Actinic keratosis: Secondary | ICD-10-CM | POA: Diagnosis not present

## 2017-09-04 DIAGNOSIS — D1801 Hemangioma of skin and subcutaneous tissue: Secondary | ICD-10-CM | POA: Diagnosis not present

## 2017-09-04 DIAGNOSIS — L814 Other melanin hyperpigmentation: Secondary | ICD-10-CM | POA: Diagnosis not present

## 2017-09-04 DIAGNOSIS — Z85828 Personal history of other malignant neoplasm of skin: Secondary | ICD-10-CM | POA: Diagnosis not present

## 2017-09-04 DIAGNOSIS — L820 Inflamed seborrheic keratosis: Secondary | ICD-10-CM | POA: Diagnosis not present

## 2017-09-11 DIAGNOSIS — S52572A Other intraarticular fracture of lower end of left radius, initial encounter for closed fracture: Secondary | ICD-10-CM | POA: Diagnosis not present

## 2017-09-18 DIAGNOSIS — M25774 Osteophyte, right foot: Secondary | ICD-10-CM | POA: Diagnosis not present

## 2017-09-18 DIAGNOSIS — L600 Ingrowing nail: Secondary | ICD-10-CM | POA: Diagnosis not present

## 2017-10-09 DIAGNOSIS — S52572A Other intraarticular fracture of lower end of left radius, initial encounter for closed fracture: Secondary | ICD-10-CM | POA: Diagnosis not present

## 2017-10-09 DIAGNOSIS — M722 Plantar fascial fibromatosis: Secondary | ICD-10-CM | POA: Diagnosis not present

## 2017-10-09 DIAGNOSIS — M7731 Calcaneal spur, right foot: Secondary | ICD-10-CM | POA: Diagnosis not present

## 2017-10-16 DIAGNOSIS — M722 Plantar fascial fibromatosis: Secondary | ICD-10-CM | POA: Diagnosis not present

## 2017-10-24 DIAGNOSIS — H40013 Open angle with borderline findings, low risk, bilateral: Secondary | ICD-10-CM | POA: Diagnosis not present

## 2017-10-30 DIAGNOSIS — M71571 Other bursitis, not elsewhere classified, right ankle and foot: Secondary | ICD-10-CM | POA: Diagnosis not present

## 2017-10-30 DIAGNOSIS — M722 Plantar fascial fibromatosis: Secondary | ICD-10-CM | POA: Diagnosis not present

## 2017-11-14 DIAGNOSIS — G4733 Obstructive sleep apnea (adult) (pediatric): Secondary | ICD-10-CM | POA: Diagnosis not present

## 2018-01-02 DIAGNOSIS — M25562 Pain in left knee: Secondary | ICD-10-CM | POA: Diagnosis not present

## 2018-02-15 DIAGNOSIS — R7301 Impaired fasting glucose: Secondary | ICD-10-CM | POA: Diagnosis not present

## 2018-02-15 DIAGNOSIS — Z0000 Encounter for general adult medical examination without abnormal findings: Secondary | ICD-10-CM | POA: Diagnosis not present

## 2018-02-15 DIAGNOSIS — Z79899 Other long term (current) drug therapy: Secondary | ICD-10-CM | POA: Diagnosis not present

## 2018-02-15 DIAGNOSIS — F324 Major depressive disorder, single episode, in partial remission: Secondary | ICD-10-CM | POA: Diagnosis not present

## 2018-02-15 DIAGNOSIS — E291 Testicular hypofunction: Secondary | ICD-10-CM | POA: Diagnosis not present

## 2018-02-15 DIAGNOSIS — Z62819 Personal history of unspecified abuse in childhood: Secondary | ICD-10-CM | POA: Diagnosis not present

## 2018-02-15 DIAGNOSIS — I10 Essential (primary) hypertension: Secondary | ICD-10-CM | POA: Diagnosis not present

## 2018-02-15 DIAGNOSIS — I2510 Atherosclerotic heart disease of native coronary artery without angina pectoris: Secondary | ICD-10-CM | POA: Diagnosis not present

## 2018-02-15 DIAGNOSIS — D692 Other nonthrombocytopenic purpura: Secondary | ICD-10-CM | POA: Diagnosis not present

## 2018-03-05 DIAGNOSIS — L820 Inflamed seborrheic keratosis: Secondary | ICD-10-CM | POA: Diagnosis not present

## 2018-03-05 DIAGNOSIS — L570 Actinic keratosis: Secondary | ICD-10-CM | POA: Diagnosis not present

## 2018-03-05 DIAGNOSIS — Z85820 Personal history of malignant melanoma of skin: Secondary | ICD-10-CM | POA: Diagnosis not present

## 2018-03-05 DIAGNOSIS — L821 Other seborrheic keratosis: Secondary | ICD-10-CM | POA: Diagnosis not present

## 2018-03-05 DIAGNOSIS — L814 Other melanin hyperpigmentation: Secondary | ICD-10-CM | POA: Diagnosis not present

## 2018-03-05 DIAGNOSIS — Z85828 Personal history of other malignant neoplasm of skin: Secondary | ICD-10-CM | POA: Diagnosis not present

## 2018-03-05 DIAGNOSIS — D225 Melanocytic nevi of trunk: Secondary | ICD-10-CM | POA: Diagnosis not present

## 2018-03-26 DIAGNOSIS — N3943 Post-void dribbling: Secondary | ICD-10-CM | POA: Diagnosis not present

## 2018-03-26 DIAGNOSIS — N401 Enlarged prostate with lower urinary tract symptoms: Secondary | ICD-10-CM | POA: Diagnosis not present

## 2018-03-26 DIAGNOSIS — R948 Abnormal results of function studies of other organs and systems: Secondary | ICD-10-CM | POA: Diagnosis not present

## 2018-03-26 DIAGNOSIS — E291 Testicular hypofunction: Secondary | ICD-10-CM | POA: Diagnosis not present

## 2018-04-30 DIAGNOSIS — R948 Abnormal results of function studies of other organs and systems: Secondary | ICD-10-CM | POA: Diagnosis not present

## 2018-04-30 DIAGNOSIS — E291 Testicular hypofunction: Secondary | ICD-10-CM | POA: Diagnosis not present

## 2018-06-05 DIAGNOSIS — E291 Testicular hypofunction: Secondary | ICD-10-CM | POA: Diagnosis not present

## 2018-06-27 DIAGNOSIS — E291 Testicular hypofunction: Secondary | ICD-10-CM | POA: Diagnosis not present

## 2018-07-30 DIAGNOSIS — R948 Abnormal results of function studies of other organs and systems: Secondary | ICD-10-CM | POA: Diagnosis not present

## 2018-08-13 DIAGNOSIS — E291 Testicular hypofunction: Secondary | ICD-10-CM | POA: Diagnosis not present

## 2018-08-14 ENCOUNTER — Other Ambulatory Visit: Payer: Self-pay

## 2018-08-14 DIAGNOSIS — Z20822 Contact with and (suspected) exposure to covid-19: Secondary | ICD-10-CM

## 2018-08-18 LAB — NOVEL CORONAVIRUS, NAA: SARS-CoV-2, NAA: NOT DETECTED

## 2018-09-03 DIAGNOSIS — D225 Melanocytic nevi of trunk: Secondary | ICD-10-CM | POA: Diagnosis not present

## 2018-09-03 DIAGNOSIS — L821 Other seborrheic keratosis: Secondary | ICD-10-CM | POA: Diagnosis not present

## 2018-09-03 DIAGNOSIS — Z85820 Personal history of malignant melanoma of skin: Secondary | ICD-10-CM | POA: Diagnosis not present

## 2018-09-03 DIAGNOSIS — D1801 Hemangioma of skin and subcutaneous tissue: Secondary | ICD-10-CM | POA: Diagnosis not present

## 2018-09-03 DIAGNOSIS — L570 Actinic keratosis: Secondary | ICD-10-CM | POA: Diagnosis not present

## 2018-09-03 DIAGNOSIS — L814 Other melanin hyperpigmentation: Secondary | ICD-10-CM | POA: Diagnosis not present

## 2018-09-17 DIAGNOSIS — M7661 Achilles tendinitis, right leg: Secondary | ICD-10-CM | POA: Diagnosis not present

## 2018-09-17 DIAGNOSIS — M7731 Calcaneal spur, right foot: Secondary | ICD-10-CM | POA: Diagnosis not present

## 2018-10-01 DIAGNOSIS — M7661 Achilles tendinitis, right leg: Secondary | ICD-10-CM | POA: Diagnosis not present

## 2018-10-16 DIAGNOSIS — E291 Testicular hypofunction: Secondary | ICD-10-CM | POA: Diagnosis not present

## 2018-10-30 DIAGNOSIS — Z961 Presence of intraocular lens: Secondary | ICD-10-CM | POA: Diagnosis not present

## 2018-10-30 DIAGNOSIS — E291 Testicular hypofunction: Secondary | ICD-10-CM | POA: Diagnosis not present

## 2018-10-30 DIAGNOSIS — H40013 Open angle with borderline findings, low risk, bilateral: Secondary | ICD-10-CM | POA: Diagnosis not present

## 2018-11-13 DIAGNOSIS — E291 Testicular hypofunction: Secondary | ICD-10-CM | POA: Diagnosis not present

## 2019-02-08 ENCOUNTER — Ambulatory Visit: Payer: Medicare Other | Attending: Internal Medicine

## 2019-02-08 DIAGNOSIS — Z23 Encounter for immunization: Secondary | ICD-10-CM | POA: Insufficient documentation

## 2019-02-08 NOTE — Progress Notes (Signed)
   Covid-19 Vaccination Clinic  Name:  Christopher Acevedo    MRN: [id number] DOB: 1940/03/08  02/08/2019  [name] was observed post Covid-19 immunization for 15 minutes without incidence. He was provided with Vaccine Information Sheet and instruction to access the V-Safe system.   [name] was instructed to call 911 with any severe reactions post vaccine: Marland Kitchen Difficulty breathing  . Swelling of your face and throat  . A fast heartbeat  . A bad rash all over your body  . Dizziness and weakness    Immunizations Administered    Name Date Dose VIS Date Route   Pfizer COVID-19 Vaccine 02/08/2019  1:47 PM 0.3 mL 01/03/2019 Intramuscular   Manufacturer: [place]   Lot: [id number]   [place]: [id number]

## 2019-02-26 ENCOUNTER — Ambulatory Visit: Payer: Medicare HMO | Attending: Internal Medicine

## 2019-02-26 ENCOUNTER — Ambulatory Visit: Payer: Medicare Other

## 2019-02-26 DIAGNOSIS — Z23 Encounter for immunization: Secondary | ICD-10-CM | POA: Insufficient documentation

## 2019-02-26 NOTE — Progress Notes (Signed)
   Covid-19 Vaccination Clinic  Name:  Christopher Acevedo    MRN: [id number] DOB: May 12, 1940  02/26/2019  [name] was observed post Covid-19 immunization for 15 minutes without incidence. He was provided with Vaccine Information Sheet and instruction to access the V-Safe system.   [name] was instructed to call 911 with any severe reactions post vaccine: Marland Kitchen Difficulty breathing  . Swelling of your face and throat  . A fast heartbeat  . A bad rash all over your body  . Dizziness and weakness    Immunizations Administered    Name Date Dose VIS Date Route   Pfizer COVID-19 Vaccine 02/26/2019 11:08 AM 0.3 mL 01/03/2019 Intramuscular   Manufacturer: [place]   Lot: [id number]   [place]: [id number]

## 2019-03-04 DIAGNOSIS — L821 Other seborrheic keratosis: Secondary | ICD-10-CM | POA: Diagnosis not present

## 2019-03-04 DIAGNOSIS — L905 Scar conditions and fibrosis of skin: Secondary | ICD-10-CM | POA: Diagnosis not present

## 2019-03-04 DIAGNOSIS — L814 Other melanin hyperpigmentation: Secondary | ICD-10-CM | POA: Diagnosis not present

## 2019-03-04 DIAGNOSIS — L570 Actinic keratosis: Secondary | ICD-10-CM | POA: Diagnosis not present

## 2019-03-04 DIAGNOSIS — D225 Melanocytic nevi of trunk: Secondary | ICD-10-CM | POA: Diagnosis not present

## 2019-03-04 DIAGNOSIS — L578 Other skin changes due to chronic exposure to nonionizing radiation: Secondary | ICD-10-CM | POA: Diagnosis not present

## 2019-03-04 DIAGNOSIS — L820 Inflamed seborrheic keratosis: Secondary | ICD-10-CM | POA: Diagnosis not present

## 2019-03-04 DIAGNOSIS — Z85820 Personal history of malignant melanoma of skin: Secondary | ICD-10-CM | POA: Diagnosis not present

## 2019-04-01 DIAGNOSIS — C4491 Basal cell carcinoma of skin, unspecified: Secondary | ICD-10-CM | POA: Diagnosis not present

## 2019-04-01 DIAGNOSIS — Z86010 Personal history of colonic polyps: Secondary | ICD-10-CM | POA: Diagnosis not present

## 2019-04-01 DIAGNOSIS — D692 Other nonthrombocytopenic purpura: Secondary | ICD-10-CM | POA: Diagnosis not present

## 2019-04-01 DIAGNOSIS — E291 Testicular hypofunction: Secondary | ICD-10-CM | POA: Diagnosis not present

## 2019-04-01 DIAGNOSIS — R7301 Impaired fasting glucose: Secondary | ICD-10-CM | POA: Diagnosis not present

## 2019-04-01 DIAGNOSIS — C439 Malignant melanoma of skin, unspecified: Secondary | ICD-10-CM | POA: Diagnosis not present

## 2019-04-01 DIAGNOSIS — I714 Abdominal aortic aneurysm, without rupture: Secondary | ICD-10-CM | POA: Diagnosis not present

## 2019-04-01 DIAGNOSIS — K7581 Nonalcoholic steatohepatitis (NASH): Secondary | ICD-10-CM | POA: Diagnosis not present

## 2019-04-01 DIAGNOSIS — Z0000 Encounter for general adult medical examination without abnormal findings: Secondary | ICD-10-CM | POA: Diagnosis not present

## 2019-04-01 DIAGNOSIS — Z79899 Other long term (current) drug therapy: Secondary | ICD-10-CM | POA: Diagnosis not present

## 2019-04-01 DIAGNOSIS — G245 Blepharospasm: Secondary | ICD-10-CM | POA: Diagnosis not present

## 2019-04-01 DIAGNOSIS — I10 Essential (primary) hypertension: Secondary | ICD-10-CM | POA: Diagnosis not present

## 2019-04-01 DIAGNOSIS — F324 Major depressive disorder, single episode, in partial remission: Secondary | ICD-10-CM | POA: Diagnosis not present

## 2019-04-16 DIAGNOSIS — E1169 Type 2 diabetes mellitus with other specified complication: Secondary | ICD-10-CM | POA: Diagnosis not present

## 2019-04-16 DIAGNOSIS — E119 Type 2 diabetes mellitus without complications: Secondary | ICD-10-CM | POA: Diagnosis not present

## 2019-04-16 DIAGNOSIS — I10 Essential (primary) hypertension: Secondary | ICD-10-CM | POA: Diagnosis not present

## 2019-04-16 DIAGNOSIS — I2510 Atherosclerotic heart disease of native coronary artery without angina pectoris: Secondary | ICD-10-CM | POA: Diagnosis not present

## 2019-05-21 ENCOUNTER — Encounter: Payer: Medicare HMO | Attending: Family Medicine | Admitting: Skilled Nursing Facility1

## 2019-05-21 ENCOUNTER — Encounter: Payer: Self-pay | Admitting: Skilled Nursing Facility1

## 2019-05-21 ENCOUNTER — Other Ambulatory Visit: Payer: Self-pay

## 2019-05-21 DIAGNOSIS — E119 Type 2 diabetes mellitus without complications: Secondary | ICD-10-CM

## 2019-05-21 NOTE — Progress Notes (Signed)
Diabetes Self-Management Education  Visit Type: First/Initial   05/21/2019  [name], identified by name and date of birth, is a 79 y.o. male with a diagnosis of Diabetes: Type 2.   ASSESSMENT  Height 6' (1.829 m), weight 254 lb (115.2 kg). Body mass index is 34.45 kg/m.  Pt states he does wear his C-PAP every night.  Pt states every night he would have a mixed drink.  Pt states his wife checks his blood sugar for him randomly throughout the week.  Pt states he drinks 2% milk and cannot go lower than that.  Pt states he plays golf 3 times a week.  Pt states he stopped eating ice cream daily.  Pt states he does drink fairlife and carnation daily.  Pt states he is not a big fan of non starchy vegetables.  Pt states he wants to eat 2 biscuits a week and dietitian replied okay but you have to eat non starchy vegetables with lunch to which pt states "MMMM I dont know about that." Pt will try to drink more water and eat more non starchy vegetables but cannot make any promises.   Goals: Check your blood sugar 3 times a week: fasting or 2 hours post prandial Aim for 3 water bottles a day of water Aim for non starchy vegetables with every lunch and dinner Choose between your sweets each day to avoid overeating them Add tonic to your beverage to reduce the amount of juice  Be sure to eat before playing golf or working in the yard to prevent hypoglycemia Do not skip meals at least have cheese and crackers for lunch to avoid hypoglycemia,ia   Diabetes Self-Management Education - 05/21/19 0859      Visit Information   Visit Type  First/Initial      Initial Visit   Diabetes Type  Type 2    Are you currently following a meal plan?  No    Are you taking your medications as prescribed?  Not on Medications      Health Coping   How would you rate your overall health?  Good      Psychosocial Assessment   Patient Belief/Attitude about Diabetes  Motivated to manage diabetes    Self-care barriers  Hard of hearing    Self-management support  Family      Pre-Education Assessment   Patient understands the diabetes disease and treatment process.  Needs Instruction    Patient understands incorporating nutritional management into lifestyle.  Needs Instruction    Patient undertands incorporating physical activity into lifestyle.  Needs Instruction    Patient understands using medications safely.  Needs Instruction    Patient understands monitoring blood glucose, interpreting and using results  Needs Instruction    Patient understands prevention, detection, and treatment of acute complications.  Needs Instruction    Patient understands prevention, detection, and treatment of chronic complications.  Needs Instruction    Patient understands how to develop strategies to address psychosocial issues.  Needs Instruction    Patient understands how to develop strategies to promote health/change behavior.  Needs Instruction      Complications   Last HgB A1C per patient/outside source  6.7 %    How often do you check your blood sugar?  1-2 times/day    Postprandial Blood glucose range (mg/dL)  70-129    Number of hypoglycemic episodes per month  0    Number of hyperglycemic episodes per week  0    Have you had  a dilated eye exam in the past 12 months?  Yes    Have you had a dental exam in the past 12 months?  Yes    Are you checking your feet?  Yes    How many days per week are you checking your feet?  7      Dietary Intake   Breakfast  nutrigrain bar or wheat toast with no butter + blackberry jam or cereal with strawberry and banana    Lunch  sandwich with mayo + pickle + chips    Dinner  salad and chil from [employer] (evening)  cupcake    Beverage(s)  coffee + half anf half and 1 tsp sugar, water, fairlife milk, carnation      Patient Education   Previous Diabetes Education  No    Personal strategies to promote health  Lifestyle issues that need to be addressed for  better diabetes care      Individualized Goals (developed by patient)   Nutrition  General guidelines for healthy choices and portions discussed;Follow meal plan discussed;Adjust meds/carbs with exercise as discussed    Physical Activity  Exercise 3-5 times per week;30 minutes per day    Monitoring   test my blood glucose as discussed;test blood glucose pre and post meals as discussed      Post-Education Assessment   Patient understands the diabetes disease and treatment process.  Demonstrates understanding / competency    Patient understands incorporating nutritional management into lifestyle.  Demonstrates understanding / competency    Patient undertands incorporating physical activity into lifestyle.  Demonstrates understanding / competency    Patient understands using medications safely.  Demonstrates understanding / competency    Patient understands monitoring blood glucose, interpreting and using results  Demonstrates understanding / competency    Patient understands prevention, detection, and treatment of acute complications.  Demonstrates understanding / competency    Patient understands prevention, detection, and treatment of chronic complications.  Demonstrates understanding / competency    Patient understands how to develop strategies to address psychosocial issues.  Demonstrates understanding / competency    Patient understands how to develop strategies to promote health/change behavior.  Demonstrates understanding / competency      Outcomes   Expected Outcomes  Demonstrated interest in learning. Expect positive outcomes    Future DMSE  PRN    Program Status  Completed       Individualized Plan for Diabetes Self-Management Training:   Learning Objective:  Patient will have a greater understanding of diabetes self-management. Patient education plan is to attend individual and/or group sessions per assessed needs and concerns.   Plan:   There are no Patient Instructions on  file for this visit.  Expected Outcomes:  Demonstrated interest in learning. Expect positive outcomes  Education material provided: ADA - How to Thrive: A Guide for Your Journey with Diabetes, My Plate and Snack sheet  If problems or questions, patient to contact team via:  Phone  Future DSME appointment: PRN

## 2019-07-16 DIAGNOSIS — M65321 Trigger finger, right index finger: Secondary | ICD-10-CM | POA: Diagnosis not present

## 2019-07-16 DIAGNOSIS — M19041 Primary osteoarthritis, right hand: Secondary | ICD-10-CM | POA: Diagnosis not present

## 2019-07-16 DIAGNOSIS — M65331 Trigger finger, right middle finger: Secondary | ICD-10-CM | POA: Diagnosis not present

## 2019-07-16 DIAGNOSIS — R2231 Localized swelling, mass and lump, right upper limb: Secondary | ICD-10-CM | POA: Diagnosis not present

## 2019-07-16 DIAGNOSIS — M65841 Other synovitis and tenosynovitis, right hand: Secondary | ICD-10-CM | POA: Diagnosis not present

## 2019-08-12 DIAGNOSIS — K5909 Other constipation: Secondary | ICD-10-CM | POA: Diagnosis not present

## 2019-08-12 DIAGNOSIS — E1169 Type 2 diabetes mellitus with other specified complication: Secondary | ICD-10-CM | POA: Diagnosis not present

## 2019-10-14 DIAGNOSIS — L814 Other melanin hyperpigmentation: Secondary | ICD-10-CM | POA: Diagnosis not present

## 2019-10-14 DIAGNOSIS — L821 Other seborrheic keratosis: Secondary | ICD-10-CM | POA: Diagnosis not present

## 2019-10-14 DIAGNOSIS — D1801 Hemangioma of skin and subcutaneous tissue: Secondary | ICD-10-CM | POA: Diagnosis not present

## 2019-10-14 DIAGNOSIS — D692 Other nonthrombocytopenic purpura: Secondary | ICD-10-CM | POA: Diagnosis not present

## 2019-10-14 DIAGNOSIS — L570 Actinic keratosis: Secondary | ICD-10-CM | POA: Diagnosis not present

## 2019-10-14 DIAGNOSIS — L905 Scar conditions and fibrosis of skin: Secondary | ICD-10-CM | POA: Diagnosis not present

## 2019-10-14 DIAGNOSIS — L578 Other skin changes due to chronic exposure to nonionizing radiation: Secondary | ICD-10-CM | POA: Diagnosis not present

## 2019-10-14 DIAGNOSIS — Z85820 Personal history of malignant melanoma of skin: Secondary | ICD-10-CM | POA: Diagnosis not present

## 2019-10-14 DIAGNOSIS — D225 Melanocytic nevi of trunk: Secondary | ICD-10-CM | POA: Diagnosis not present

## 2019-10-26 DIAGNOSIS — Z20828 Contact with and (suspected) exposure to other viral communicable diseases: Secondary | ICD-10-CM | POA: Diagnosis not present

## 2019-11-05 DIAGNOSIS — Z961 Presence of intraocular lens: Secondary | ICD-10-CM | POA: Diagnosis not present

## 2019-11-05 DIAGNOSIS — H40013 Open angle with borderline findings, low risk, bilateral: Secondary | ICD-10-CM | POA: Diagnosis not present

## 2020-01-01 DIAGNOSIS — I10 Essential (primary) hypertension: Secondary | ICD-10-CM | POA: Diagnosis not present

## 2020-01-01 DIAGNOSIS — R351 Nocturia: Secondary | ICD-10-CM | POA: Diagnosis not present

## 2020-01-01 DIAGNOSIS — N401 Enlarged prostate with lower urinary tract symptoms: Secondary | ICD-10-CM | POA: Diagnosis not present

## 2020-01-01 DIAGNOSIS — L02519 Cutaneous abscess of unspecified hand: Secondary | ICD-10-CM | POA: Diagnosis not present

## 2020-01-21 DIAGNOSIS — M6740 Ganglion, unspecified site: Secondary | ICD-10-CM | POA: Diagnosis not present

## 2020-01-28 DIAGNOSIS — M71342 Other bursal cyst, left hand: Secondary | ICD-10-CM | POA: Diagnosis not present

## 2020-01-28 DIAGNOSIS — R2232 Localized swelling, mass and lump, left upper limb: Secondary | ICD-10-CM | POA: Diagnosis not present

## 2020-01-28 DIAGNOSIS — M19042 Primary osteoarthritis, left hand: Secondary | ICD-10-CM | POA: Diagnosis not present

## 2020-02-10 DIAGNOSIS — M19071 Primary osteoarthritis, right ankle and foot: Secondary | ICD-10-CM | POA: Diagnosis not present

## 2020-02-10 DIAGNOSIS — M67371 Transient synovitis, right ankle and foot: Secondary | ICD-10-CM | POA: Diagnosis not present

## 2020-02-10 DIAGNOSIS — M109 Gout, unspecified: Secondary | ICD-10-CM | POA: Diagnosis not present

## 2020-02-12 DIAGNOSIS — M67371 Transient synovitis, right ankle and foot: Secondary | ICD-10-CM | POA: Diagnosis not present

## 2020-02-19 DIAGNOSIS — M109 Gout, unspecified: Secondary | ICD-10-CM | POA: Diagnosis not present

## 2020-02-19 DIAGNOSIS — M2550 Pain in unspecified joint: Secondary | ICD-10-CM | POA: Diagnosis not present

## 2020-02-19 DIAGNOSIS — I10 Essential (primary) hypertension: Secondary | ICD-10-CM | POA: Diagnosis not present

## 2020-02-19 DIAGNOSIS — R7309 Other abnormal glucose: Secondary | ICD-10-CM | POA: Diagnosis not present

## 2020-02-19 DIAGNOSIS — M1990 Unspecified osteoarthritis, unspecified site: Secondary | ICD-10-CM | POA: Diagnosis not present

## 2020-02-27 ENCOUNTER — Encounter (HOSPITAL_BASED_OUTPATIENT_CLINIC_OR_DEPARTMENT_OTHER): Payer: Self-pay | Admitting: Orthopedic Surgery

## 2020-02-27 ENCOUNTER — Other Ambulatory Visit: Payer: Self-pay | Admitting: Orthopedic Surgery

## 2020-02-27 ENCOUNTER — Other Ambulatory Visit: Payer: Self-pay

## 2020-02-27 DIAGNOSIS — R2232 Localized swelling, mass and lump, left upper limb: Secondary | ICD-10-CM | POA: Diagnosis not present

## 2020-03-01 ENCOUNTER — Other Ambulatory Visit (HOSPITAL_COMMUNITY)
Admission: RE | Admit: 2020-03-01 | Discharge: 2020-03-01 | Disposition: A | Discharge status: Home / Self Care | Payer: Medicare HMO | Source: Ambulatory Visit | Attending: Orthopedic Surgery | Admitting: Orthopedic Surgery

## 2020-03-01 ENCOUNTER — Encounter (HOSPITAL_BASED_OUTPATIENT_CLINIC_OR_DEPARTMENT_OTHER)
Admission: RE | Admit: 2020-03-01 | Discharge: 2020-03-01 | Disposition: A | Discharge status: Home / Self Care | Payer: Medicare HMO | Source: Ambulatory Visit | Attending: Orthopedic Surgery | Admitting: Orthopedic Surgery

## 2020-03-01 DIAGNOSIS — E1169 Type 2 diabetes mellitus with other specified complication: Secondary | ICD-10-CM | POA: Diagnosis not present

## 2020-03-01 DIAGNOSIS — Z20822 Contact with and (suspected) exposure to covid-19: Secondary | ICD-10-CM | POA: Insufficient documentation

## 2020-03-01 DIAGNOSIS — Z01812 Encounter for preprocedural laboratory examination: Secondary | ICD-10-CM | POA: Insufficient documentation

## 2020-03-01 DIAGNOSIS — Z01810 Encounter for preprocedural cardiovascular examination: Secondary | ICD-10-CM | POA: Insufficient documentation

## 2020-03-01 DIAGNOSIS — L929 Granulomatous disorder of the skin and subcutaneous tissue, unspecified: Secondary | ICD-10-CM | POA: Diagnosis not present

## 2020-03-01 DIAGNOSIS — I10 Essential (primary) hypertension: Secondary | ICD-10-CM | POA: Diagnosis not present

## 2020-03-01 DIAGNOSIS — M1000 Idiopathic gout, unspecified site: Secondary | ICD-10-CM | POA: Diagnosis not present

## 2020-03-01 LAB — SARS CORONAVIRUS 2 (TAT 6-24 HRS): SARS Coronavirus 2: NEGATIVE

## 2020-03-01 NOTE — Progress Notes (Signed)

## 2020-03-02 DIAGNOSIS — M1990 Unspecified osteoarthritis, unspecified site: Secondary | ICD-10-CM | POA: Diagnosis not present

## 2020-03-02 DIAGNOSIS — M109 Gout, unspecified: Secondary | ICD-10-CM | POA: Diagnosis not present

## 2020-03-02 DIAGNOSIS — I10 Essential (primary) hypertension: Secondary | ICD-10-CM | POA: Diagnosis not present

## 2020-03-02 DIAGNOSIS — R7309 Other abnormal glucose: Secondary | ICD-10-CM | POA: Diagnosis not present

## 2020-03-04 ENCOUNTER — Other Ambulatory Visit: Payer: Self-pay

## 2020-03-04 ENCOUNTER — Ambulatory Visit (HOSPITAL_BASED_OUTPATIENT_CLINIC_OR_DEPARTMENT_OTHER): Payer: Medicare HMO | Admitting: Anesthesiology

## 2020-03-04 ENCOUNTER — Encounter (HOSPITAL_BASED_OUTPATIENT_CLINIC_OR_DEPARTMENT_OTHER)
Admission: RE | Disposition: A | Discharge status: Home / Self Care | Payer: Self-pay | Source: Home / Self Care | Attending: Orthopedic Surgery

## 2020-03-04 ENCOUNTER — Encounter (HOSPITAL_BASED_OUTPATIENT_CLINIC_OR_DEPARTMENT_OTHER): Payer: Self-pay | Admitting: Orthopedic Surgery

## 2020-03-04 ENCOUNTER — Ambulatory Visit (HOSPITAL_BASED_OUTPATIENT_CLINIC_OR_DEPARTMENT_OTHER)
Admission: RE | Admit: 2020-03-04 | Discharge: 2020-03-04 | Disposition: A | Discharge status: Home / Self Care | Payer: Medicare HMO | Attending: Orthopedic Surgery | Admitting: Orthopedic Surgery

## 2020-03-04 DIAGNOSIS — Z79899 Other long term (current) drug therapy: Secondary | ICD-10-CM | POA: Insufficient documentation

## 2020-03-04 DIAGNOSIS — M19042 Primary osteoarthritis, left hand: Secondary | ICD-10-CM | POA: Insufficient documentation

## 2020-03-04 DIAGNOSIS — Z87891 Personal history of nicotine dependence: Secondary | ICD-10-CM | POA: Diagnosis not present

## 2020-03-04 DIAGNOSIS — I10 Essential (primary) hypertension: Secondary | ICD-10-CM | POA: Diagnosis not present

## 2020-03-04 DIAGNOSIS — Z8261 Family history of arthritis: Secondary | ICD-10-CM | POA: Diagnosis not present

## 2020-03-04 DIAGNOSIS — Z885 Allergy status to narcotic agent status: Secondary | ICD-10-CM | POA: Diagnosis not present

## 2020-03-04 DIAGNOSIS — Z85828 Personal history of other malignant neoplasm of skin: Secondary | ICD-10-CM | POA: Diagnosis not present

## 2020-03-04 DIAGNOSIS — M1812 Unilateral primary osteoarthritis of first carpometacarpal joint, left hand: Secondary | ICD-10-CM | POA: Diagnosis not present

## 2020-03-04 DIAGNOSIS — I2510 Atherosclerotic heart disease of native coronary artery without angina pectoris: Secondary | ICD-10-CM | POA: Diagnosis not present

## 2020-03-04 DIAGNOSIS — K219 Gastro-esophageal reflux disease without esophagitis: Secondary | ICD-10-CM | POA: Diagnosis not present

## 2020-03-04 DIAGNOSIS — Z7982 Long term (current) use of aspirin: Secondary | ICD-10-CM | POA: Diagnosis not present

## 2020-03-04 DIAGNOSIS — G4730 Sleep apnea, unspecified: Secondary | ICD-10-CM | POA: Diagnosis not present

## 2020-03-04 DIAGNOSIS — Z791 Long term (current) use of non-steroidal anti-inflammatories (NSAID): Secondary | ICD-10-CM | POA: Diagnosis not present

## 2020-03-04 DIAGNOSIS — M71342 Other bursal cyst, left hand: Secondary | ICD-10-CM | POA: Insufficient documentation

## 2020-03-04 HISTORY — PX: CYST EXCISION: SHX5701

## 2020-03-04 HISTORY — DX: Prediabetes: R73.03

## 2020-03-04 HISTORY — DX: Gout, unspecified: M10.9

## 2020-03-04 SURGERY — CYST REMOVAL
Anesthesia: Monitor Anesthesia Care | Site: Thumb | Laterality: Left

## 2020-03-04 MED ORDER — CEFAZOLIN SODIUM-DEXTROSE 2-4 GM/100ML-% IV SOLN
INTRAVENOUS | Status: AC
  Filled 2020-03-04: qty 100

## 2020-03-04 MED ORDER — FENTANYL CITRATE (PF) 100 MCG/2ML IJ SOLN
INTRAMUSCULAR | Status: AC
  Filled 2020-03-04: qty 2

## 2020-03-04 MED ORDER — OXYCODONE HCL 5 MG/5ML PO SOLN: 5.0000 mg | Freq: Once | ORAL | Status: DC | PRN

## 2020-03-04 MED ORDER — LIDOCAINE HCL (PF) 0.5 % IJ SOLN
INTRAMUSCULAR | Status: DC | PRN
  Administered 2020-03-04: 30 mL via INTRAVENOUS

## 2020-03-04 MED ORDER — PROPOFOL 10 MG/ML IV BOLUS
INTRAVENOUS | Status: DC | PRN
  Administered 2020-03-04 (×2): 20 mg via INTRAVENOUS

## 2020-03-04 MED ORDER — LACTATED RINGERS IV SOLN: INTRAVENOUS | Status: DC

## 2020-03-04 MED ORDER — MEPERIDINE HCL 25 MG/ML IJ SOLN: 6.2500 mg | INTRAMUSCULAR | Status: DC | PRN

## 2020-03-04 MED ORDER — PROPOFOL 500 MG/50ML IV EMUL
INTRAVENOUS | Status: DC | PRN
  Administered 2020-03-04: 50 ug/kg/min via INTRAVENOUS

## 2020-03-04 MED ORDER — TRAMADOL HCL 50 MG PO TABS: 50.0000 mg | ORAL_TABLET | Freq: Four times a day (QID) | ORAL | 0 refills | Status: DC | PRN

## 2020-03-04 MED ORDER — OXYCODONE HCL 5 MG PO TABS: 5.0000 mg | ORAL_TABLET | Freq: Once | ORAL | Status: DC | PRN

## 2020-03-04 MED ORDER — ACETAMINOPHEN 10 MG/ML IV SOLN: 1000.0000 mg | Freq: Once | INTRAVENOUS | Status: DC | PRN

## 2020-03-04 MED ORDER — FENTANYL CITRATE (PF) 100 MCG/2ML IJ SOLN
INTRAMUSCULAR | Status: DC | PRN
  Administered 2020-03-04 (×2): 50 ug via INTRAVENOUS

## 2020-03-04 MED ORDER — PROPOFOL 500 MG/50ML IV EMUL
INTRAVENOUS | Status: AC
  Filled 2020-03-04: qty 50

## 2020-03-04 MED ORDER — ONDANSETRON HCL 4 MG/2ML IJ SOLN
INTRAMUSCULAR | Status: DC | PRN
  Administered 2020-03-04: 4 mg via INTRAVENOUS

## 2020-03-04 MED ORDER — ONDANSETRON HCL 4 MG/2ML IJ SOLN: 4.0000 mg | Freq: Once | INTRAMUSCULAR | Status: DC | PRN

## 2020-03-04 MED ORDER — FENTANYL CITRATE (PF) 100 MCG/2ML IJ SOLN: 25.0000 ug | INTRAMUSCULAR | Status: DC | PRN

## 2020-03-04 MED ORDER — BUPIVACAINE HCL (PF) 0.25 % IJ SOLN
INTRAMUSCULAR | Status: DC | PRN
  Administered 2020-03-04: 8 mL

## 2020-03-04 MED ORDER — ONDANSETRON HCL 4 MG/2ML IJ SOLN
INTRAMUSCULAR | Status: AC
  Filled 2020-03-04: qty 2

## 2020-03-04 MED ORDER — CEFAZOLIN SODIUM-DEXTROSE 2-4 GM/100ML-% IV SOLN
2.0000 g | INTRAVENOUS | Status: AC
  Administered 2020-03-04: 2 g via INTRAVENOUS

## 2020-03-04 MED ORDER — ACETAMINOPHEN 160 MG/5ML PO SOLN: 325.0000 mg | ORAL | Status: DC | PRN

## 2020-03-04 MED ORDER — ACETAMINOPHEN 325 MG PO TABS: 325.0000 mg | ORAL_TABLET | ORAL | Status: DC | PRN

## 2020-03-04 SURGICAL SUPPLY — 41 items
APL PRP STRL LF DISP 70% ISPRP (MISCELLANEOUS) ×1
BLADE MINI RND TIP GREEN BEAV (BLADE) IMPLANT
BLADE SURG 15 STRL LF DISP TIS (BLADE) ×1 IMPLANT
BLADE SURG 15 STRL SS (BLADE) ×2
BNDG CMPR 9X4 STRL LF SNTH (GAUZE/BANDAGES/DRESSINGS) ×1
BNDG COHESIVE 1X5 TAN STRL LF (GAUZE/BANDAGES/DRESSINGS) ×1 IMPLANT
BNDG COHESIVE 2X5 TAN STRL LF (GAUZE/BANDAGES/DRESSINGS) IMPLANT
BNDG COHESIVE 3X5 TAN STRL LF (GAUZE/BANDAGES/DRESSINGS) IMPLANT
BNDG ESMARK 4X9 LF (GAUZE/BANDAGES/DRESSINGS) ×1 IMPLANT
BNDG GAUZE ELAST 4 BULKY (GAUZE/BANDAGES/DRESSINGS) IMPLANT
CHLORAPREP W/TINT 26 (MISCELLANEOUS) ×2 IMPLANT
CORD BIPOLAR FORCEPS 12FT (ELECTRODE) ×2 IMPLANT
COVER BACK TABLE 60X90IN (DRAPES) ×2 IMPLANT
COVER MAYO STAND STRL (DRAPES) ×2 IMPLANT
CUFF TOURN SGL QUICK 18X4 (TOURNIQUET CUFF) ×1 IMPLANT
DRAIN PENROSE 1/2X12 LTX STRL (WOUND CARE) IMPLANT
DRAPE EXTREMITY T 121X128X90 (DISPOSABLE) ×2 IMPLANT
DRAPE SURG 17X23 STRL (DRAPES) ×2 IMPLANT
GAUZE SPONGE 4X4 12PLY STRL (GAUZE/BANDAGES/DRESSINGS) ×2 IMPLANT
GAUZE XEROFORM 1X8 LF (GAUZE/BANDAGES/DRESSINGS) ×2 IMPLANT
GLOVE SURG ORTHO LTX SZ8 (GLOVE) ×2 IMPLANT
GLOVE SURG UNDER POLY LF SZ8.5 (GLOVE) ×2 IMPLANT
GOWN STRL REUS W/ TWL LRG LVL3 (GOWN DISPOSABLE) ×1 IMPLANT
GOWN STRL REUS W/TWL LRG LVL3 (GOWN DISPOSABLE) ×2
GOWN STRL REUS W/TWL XL LVL3 (GOWN DISPOSABLE) ×3 IMPLANT
NDL PRECISIONGLIDE 27X1.5 (NEEDLE) IMPLANT
NEEDLE PRECISIONGLIDE 27X1.5 (NEEDLE) ×2 IMPLANT
NS IRRIG 1000ML POUR BTL (IV SOLUTION) ×2 IMPLANT
PACK BASIN DAY SURGERY FS (CUSTOM PROCEDURE TRAY) ×2 IMPLANT
PAD CAST 3X4 CTTN HI CHSV (CAST SUPPLIES) IMPLANT
PADDING CAST ABS 4INX4YD NS (CAST SUPPLIES) ×1
PADDING CAST ABS COTTON 4X4 ST (CAST SUPPLIES) ×1 IMPLANT
PADDING CAST COTTON 3X4 STRL (CAST SUPPLIES)
SPLINT FINGER 3.25 BULB 911905 (SOFTGOODS) ×1 IMPLANT
STOCKINETTE 4X48 STRL (DRAPES) ×2 IMPLANT
SUT ETHILON 4 0 PS 2 18 (SUTURE) ×2 IMPLANT
SUT VIC AB 4-0 P2 18 (SUTURE) IMPLANT
SYR BULB EAR ULCER 3OZ GRN STR (SYRINGE) ×2 IMPLANT
SYR CONTROL 10ML LL (SYRINGE) ×1 IMPLANT
TOWEL GREEN STERILE FF (TOWEL DISPOSABLE) ×4 IMPLANT
UNDERPAD 30X36 HEAVY ABSORB (UNDERPADS AND DIAPERS) ×2 IMPLANT

## 2020-03-04 NOTE — Discharge Instructions (Addendum)

## 2020-03-04 NOTE — Anesthesia Postprocedure Evaluation (Signed)
Anesthesia Post Note  Patient: Christopher Acevedo  Procedure(s) Performed: MUCOID CYST EXCISION WITH INTERPHALANGEAL JOINT DEBRIDEMENT (Left Thumb)     Patient location during evaluation: Phase II Anesthesia Type: Bier Block Level of consciousness: awake Pain management: pain level controlled Vital Signs Assessment: post-procedure vital signs reviewed and stable Respiratory status: spontaneous breathing Cardiovascular status: stable Postop Assessment: no apparent nausea or vomiting Anesthetic complications: no   No complications documented.  Last Vitals:  Vitals:   03/04/20 1245 03/04/20 1337  BP: 129/86 (!) 127/91  Pulse: (!) 59 62  Resp: (!) 23 19  Temp:  36.8 C  SpO2: 100% 97%    Last Pain:  Vitals:   03/04/20 1325  TempSrc:   PainSc: 0-No pain                 Huston Foley

## 2020-03-04 NOTE — Anesthesia Procedure Notes (Signed)
Anesthesia Regional Block: Bier block (IV Regional)   Pre-Anesthetic Checklist: ,, timeout performed, Correct Patient, Correct Site, Correct Laterality, Correct Procedure,, site marked, surgical consent,, at surgeon's request  Laterality: Left     Needles:  Injection technique: Single-shot  Needle Type: Other      Needle Gauge: 22     Additional Needles:   Procedures:,,,,, intact distal pulses, Esmarch exsanguination, single tourniquet utilized,  Narrative:  Start time: 03/04/2020 12:03 PM End time: 03/04/2020 12:03 PM  Performed by: Personally

## 2020-03-04 NOTE — Anesthesia Preprocedure Evaluation (Addendum)
Anesthesia Evaluation  Patient identified by MRN, date of birth, ID band Patient awake    Reviewed: Allergy & Precautions, NPO status , Patient's Chart, lab work & pertinent test results  Airway Mallampati: I       Dental no notable dental hx.    Pulmonary sleep apnea , former smoker,    Pulmonary exam normal        Cardiovascular hypertension, Pt. on medications + CAD and + Cardiac Stents  Normal cardiovascular exam     Neuro/Psych    GI/Hepatic GERD  Controlled,  Endo/Other    Renal/GU   negative genitourinary   Musculoskeletal   Abdominal (+) + obese,   Peds  Hematology   Anesthesia Other Findings   Reproductive/Obstetrics                             Anesthesia Physical  Anesthesia Plan  ASA: III  Anesthesia Plan: MAC and Bier Block and Bier Block-LIDOCAINE ONLY   Post-op Pain Management:    Induction: Intravenous  PONV Risk Score and Plan: 1 and Ondansetron and Dexamethasone  Airway Management Planned: Natural Airway and Simple Face Mask  Additional Equipment: None  Intra-op Plan:   Post-operative Plan: Extubation in OR  Informed Consent: I have reviewed the patients History and Physical, chart, labs and discussed the procedure including the risks, benefits and alternatives for the proposed anesthesia with the patient or authorized representative who has indicated his/her understanding and acceptance.     Dental advisory given  Plan Discussed with: CRNA  Anesthesia Plan Comments:        Anesthesia Quick Evaluation

## 2020-03-04 NOTE — Brief Op Note (Signed)
03/04/2020  12:30 PM  PATIENT:  Christopher Acevedo  80 y.o. male  PRE-OPERATIVE DIAGNOSIS:  MUCOID CYST LEFT THUMB  POST-OPERATIVE DIAGNOSIS:  MUCOID CYST LEFT THUMB  PROCEDURE:  Procedure(s) with comments: MUCOID CYST EXCISION WITH INTERPHALANGEAL JOINT DEBRIDEMENT (Left) - FOREARM BLOCK  SURGEON:  Surgeon(s) and Role:    * Daryll Brod, MD - Primary  PHYSICIAN ASSISTANT:   ASSISTANTS: none   ANESTHESIA:   local, regional and IV sedation  EBL: 18ml BLOOD ADMINISTERED:none  DRAINS: none   LOCAL MEDICATIONS USED:  BUPIVICAINE   SPECIMEN:  Excision  DISPOSITION OF SPECIMEN:  PATHOLOGY  COUNTS:  YES  TOURNIQUET:   Total Tourniquet Time Documented: Forearm (Left) - 26 minutes Total: Forearm (Left) - 26 minutes   DICTATION: .Viviann Spare Dictation  PLAN OF CARE: Discharge to home after PACU  PATIENT DISPOSITION:  PACU - hemodynamically stable.

## 2020-03-04 NOTE — Addendum Note (Signed)
Addendum  created 03/04/20 1535 by Lyn Hollingshead, MD   Clinical Note Signed

## 2020-03-04 NOTE — Anesthesia Postprocedure Evaluation (Signed)
Anesthesia Post Note  Patient: [place]  Procedure(s) Performed: MUCOID CYST EXCISION WITH INTERPHALANGEAL JOINT DEBRIDEMENT (Left Thumb)     Patient location during evaluation: Phase II Anesthesia Type: Regional Level of consciousness: awake Pain management: pain level controlled Vital Signs Assessment: post-procedure vital signs reviewed and stable Respiratory status: spontaneous breathing Cardiovascular status: stable Postop Assessment: no apparent nausea or vomiting Anesthetic complications: no   No complications documented.  Last Vitals:  Vitals:   03/04/20 1245 03/04/20 1337  BP: 129/86 (!) 127/91  Pulse: (!) 59 62  Resp: (!) 23 19  Temp:  36.8 C  SpO2: 100% 97%    Last Pain:  Vitals:   03/04/20 1325  TempSrc:   PainSc: 0-No pain                 Huston Foley

## 2020-03-04 NOTE — Op Note (Signed)
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   SURGEON: Daryll Brod, M.D.   ASSISTANT: none   ANESTHESIA:  Bier block with sedation and Local   INTRAVENOUS FLUIDS:  Per anesthesia flow sheet.   ESTIMATED BLOOD LOSS:  Minimal.   COMPLICATIONS:  None.   SPECIMENS:   Cyst and synovial tissue   TOURNIQUET TIME:    Total Tourniquet Time Documented: Forearm (Left) - 26 minutes Total: Forearm (Left) - 26 minutes    DISPOSITION:  Stable to PACU.   INDICATIONS: Patient is a 80 year old male with a large cyst of the distal aspect of his left thumb grooving his nail plate.  He desires having this excised.  This was opened on several occasions and finally healed over he is admitted for resection with debridement of the joint.  Preperi-and postoperative course been discussed along with risks and complications.  He is aware that there is no guarantee to the surgery the possibility of infection recurrence injury to arteries nerves tendons complete relief symptoms dystrophy possibility of stiffness.  In the preoperative area the patient seen the extremity marked by both patient and surgeon antibiotic given  OPERATIVE COURSE: Patient is brought to the operating room where form based IV regional anesthetic was carried out without difficulty under the direction the anesthesia department.  He was prepped using ChloraPrep in the supine position with the left arm free.  3-minute dry time was allowed timeout taken to confirm patient procedure.  A metacarpal block was given quarter percent bupivacaine  without epinephrine 9 cc was used a curvilinear incision was made over the inner phalangeal joint along the ulnar border the left thumb carried down through subcutaneous tissue.  Bleeders were electrocauterized with bipolar.  The joint was opened and debridement performed with a hemostat rondure and house curette.  Following the synovectomy the skin was undermined to the cyst distally.  This was then isolated and removed taking care to protect the dorsal skin.  The specimen was sent to pathology.  The wound was copious irrigated with saline.  The skin was closed interrupted 4-0 nylon sutures.  A compressive dressing and splint to the thumb was applied.  Patient was taken to the recovery room for observation in satisfactory condition.  He will be discharged home to return to the hand center [facility] in 1 week on Tylenol ibuprofen for pain with Ultram for breakthrough.   Daryll Brod, MD Electronically signed, 03/04/20

## 2020-03-04 NOTE — Transfer of Care (Signed)
Immediate Anesthesia Transfer of Care Note  Patient: Christopher Acevedo  Procedure(s) Performed: MUCOID CYST EXCISION WITH INTERPHALANGEAL JOINT DEBRIDEMENT (Left Thumb)  Patient Location: PACU  Anesthesia Type:MAC and Bier block  Level of Consciousness: awake, alert  and oriented  Airway & Oxygen Therapy: Patient Spontanous Breathing and Patient connected to face mask oxygen  Post-op Assessment: Report given to RN and Post -op Vital signs reviewed and stable  Post vital signs: Reviewed and stable  Last Vitals:  Vitals Value Taken Time  BP 136/88 03/04/20 1238  Temp 37 C 03/04/20 1238  Pulse 56 03/04/20 1243  Resp 22 03/04/20 1243  SpO2 99 % 03/04/20 1243  Vitals shown include unvalidated device data.  Last Pain:  Vitals:   03/04/20 0938  TempSrc: Oral  PainSc: 0-No pain         Complications: No complications documented.

## 2020-03-04 NOTE — H&P (Signed)
Christopher Acevedo is an 80 y.o. male.   Chief Complaint: mass left thumb HPI: Christopher Acevedo is a 80 yo male complaining of a mass on his left thumb. He was last seen in 07/16/2019. That was for triggering which was injected on his right index and right middle fingers. Consistent with a mass over the distal phalanx of his left thumb that has been present for approximately a 2 months. It 4 weeks ago it was opened by his PCP draining a clear fluid he was placed on I think doxycycline following that and he was referred to he states this been present for a longer period of time and he has stuck needles into it on his own. He is not complaining of any pain at the present time. He has no history of injury. He has a history of borderline diabetes no history of thyroid problems arthritis or gout.    Past Medical History:  Diagnosis Date  . Actinic keratosis    DRY SKIN WITH LESIONS- MOSTLY ON FEET AND LOWER LEGS  . Arthritis   . Cancer (HCC)    nose basal cell carcinoma, neck melanoma  . Chronic sinusitis    nasal polyp  . Diverticulosis    LOWER ABD PAIN AND DIARRHEA - MOST RECENT EPISODE WAS 11/25/13- BETTER TODAY  . Dry eyes   . Elevated cholesterol   . Environmental and seasonal allergies   . GERD (gastroesophageal reflux disease)   . Glaucoma    LEFT EYE  . Gout   . Hearing loss    BOTH EARS- HAS HEARING AIDS  . Hypertension   . Pre-diabetes   . Sleep apnea    uses nightly  . Spasm of eyelids    PT [facility]    Past Surgical History:  Procedure Laterality Date  . BLEPHROPLASTY Bilateral   . COLONOSCOPY W/ BIOPSIES AND POLYPECTOMY    . HERNIA REPAIR  1990s?  Marland Kitchen NASAL SINUS SURGERY    . POLYPECTOMY N/A 03/10/2016   Procedure: POLYPECTOMY NASAL;  Surgeon: Jerrell Belfast, MD;  Location: [place];  Service: ENT;  Laterality: N/A;  . RIGHT SHOULDER SURGERY    . RIGHT THUMB AND INDEX FINGER SURGERY FOR ARTHRITIS    . SINUS ENDO WITH  FUSION Bilateral 03/10/2016   Procedure: BILATERAL REVISION SINUS ENDOSCOPIC WITH FUSION;  Surgeon: Jerrell Belfast, MD;  Location: [place];  Service: ENT;  Laterality: Bilateral;  . TRANSANAL HEMORRHOIDAL DEARTERIALIZATION N/A 12/02/2013   Procedure: TRANSANAL HEMORRHOIDAL DEARTERIALIZATION AND LIGATION/PEXY, EXTERNAL HEMORRHOIDECTOMY, REMOVAL OF GLUTEAL MASS;  Surgeon: Michael Boston, MD;  Location: [facility];  Service: General;  Laterality: N/A;    Family History  Problem Relation Age of Onset  . Rheum arthritis Mother   . Allergic rhinitis Neg Hx   . Angioedema Neg Hx   . Asthma Neg Hx   . Atopy Neg Hx   . Eczema Neg Hx   . Immunodeficiency Neg Hx   . Urticaria Neg Hx    Social History:  reports that he quit smoking about 16 years ago. His smoking use included cigarettes. He has a 35.00 pack-year smoking history. He has quit using smokeless tobacco.  His smokeless tobacco use included snuff and chew. He reports current alcohol use of about 4.0 standard drinks of alcohol per week. He reports that he does not use drugs.  Allergies:  Allergies  Allergen Reactions  . Codeine Nausea And Vomiting  FELT FAINT    Medications Prior to Admission  Medication Sig Dispense Refill  . amLODipine (NORVASC) 10 MG tablet Take 5 mg by mouth daily.     Marland Kitchen aspirin EC 81 MG tablet Take 81 mg by mouth daily. Swallow whole.    Marland Kitchen atorvastatin (LIPITOR) 40 MG tablet Take 40 mg by mouth daily at 6 PM. Takes 1/2 tablet daily    . cholecalciferol (VITAMIN D) 1000 UNITS tablet Take 1,000 Units by mouth every morning. Reported on 07/08/2015    . Colchicine 0.6 MG CAPS Take by mouth.    [place] Omega-3 Krill Oil 500 MG CAPS Take by mouth.    . Meloxicam 7.5 MG TBDP Take by mouth.    . traMADol (ULTRAM) 50 MG tablet Take 50 mg by mouth every 12 (twelve) hours as needed for moderate pain.     . Turmeric 450 MG CAPS Take by mouth.    . cycloSPORINE (RESTASIS) 0.05 % ophthalmic emulsion Place 1 drop into both eyes 2  (two) times daily.      No results found for this or any previous visit (from the past 48 hour(s)).  No results found.   Pertinent items are noted in HPI.  Height 6' (1.829 m), weight 115.2 kg.  General appearance: alert, cooperative and appears stated age Head: Normocephalic, without obvious abnormality Neck: no JVD Resp: clear to auscultation bilaterally Cardio: regular rate and rhythm, S1, S2 normal, no murmur, click, rub or gallop GI: soft, non-tender; bowel sounds normal; no masses,  no organomegaly Extremities: mass left thumb with DJD IP joint Pulses: 2+ and symmetric Skin: Skin color, texture, turgor normal. No rashes or lesions Neurologic: Grossly normal Incision/Wound: na  Assessment/Plan  Assessment:  1. Mass of skin of left thumb    Plan: Would like to have this removed. We have discussed with him surgical excision of the mass with debris in the IP joint. Preperi-and postoperative course are discussed along with risk and complications. He is aware that there is no guarantee to the surgery possibility of infection recurrence injury to arteries nerves tendons complete relief symptoms dystrophy. He is advised should this open prior to his surgery he will call to place on an antibiotic and allow this to heal we would not recommend surgical intervention if it is recently open.    Daryll Brod 03/04/2020, 9:36 AM

## 2020-03-05 ENCOUNTER — Encounter (HOSPITAL_BASED_OUTPATIENT_CLINIC_OR_DEPARTMENT_OTHER): Payer: Self-pay | Admitting: Orthopedic Surgery

## 2020-03-05 LAB — SURGICAL PATHOLOGY

## 2020-03-30 DIAGNOSIS — M5137 Other intervertebral disc degeneration, lumbosacral region: Secondary | ICD-10-CM | POA: Diagnosis not present

## 2020-03-30 DIAGNOSIS — M48062 Spinal stenosis, lumbar region with neurogenic claudication: Secondary | ICD-10-CM | POA: Diagnosis not present

## 2020-03-30 DIAGNOSIS — E1142 Type 2 diabetes mellitus with diabetic polyneuropathy: Secondary | ICD-10-CM | POA: Diagnosis not present

## 2020-04-06 DIAGNOSIS — D692 Other nonthrombocytopenic purpura: Secondary | ICD-10-CM | POA: Diagnosis not present

## 2020-04-06 DIAGNOSIS — I714 Abdominal aortic aneurysm, without rupture: Secondary | ICD-10-CM | POA: Diagnosis not present

## 2020-04-06 DIAGNOSIS — I10 Essential (primary) hypertension: Secondary | ICD-10-CM | POA: Diagnosis not present

## 2020-04-06 DIAGNOSIS — Z79899 Other long term (current) drug therapy: Secondary | ICD-10-CM | POA: Diagnosis not present

## 2020-04-06 DIAGNOSIS — J309 Allergic rhinitis, unspecified: Secondary | ICD-10-CM | POA: Diagnosis not present

## 2020-04-06 DIAGNOSIS — E1169 Type 2 diabetes mellitus with other specified complication: Secondary | ICD-10-CM | POA: Diagnosis not present

## 2020-04-06 DIAGNOSIS — G4730 Sleep apnea, unspecified: Secondary | ICD-10-CM | POA: Diagnosis not present

## 2020-04-06 DIAGNOSIS — K219 Gastro-esophageal reflux disease without esophagitis: Secondary | ICD-10-CM | POA: Diagnosis not present

## 2020-04-06 DIAGNOSIS — Z0000 Encounter for general adult medical examination without abnormal findings: Secondary | ICD-10-CM | POA: Diagnosis not present

## 2020-04-06 DIAGNOSIS — M129 Arthropathy, unspecified: Secondary | ICD-10-CM | POA: Diagnosis not present

## 2020-04-13 DIAGNOSIS — L570 Actinic keratosis: Secondary | ICD-10-CM | POA: Diagnosis not present

## 2020-04-13 DIAGNOSIS — L821 Other seborrheic keratosis: Secondary | ICD-10-CM | POA: Diagnosis not present

## 2020-04-13 DIAGNOSIS — L820 Inflamed seborrheic keratosis: Secondary | ICD-10-CM | POA: Diagnosis not present

## 2020-04-13 DIAGNOSIS — L905 Scar conditions and fibrosis of skin: Secondary | ICD-10-CM | POA: Diagnosis not present

## 2020-04-13 DIAGNOSIS — D225 Melanocytic nevi of trunk: Secondary | ICD-10-CM | POA: Diagnosis not present

## 2020-04-13 DIAGNOSIS — L814 Other melanin hyperpigmentation: Secondary | ICD-10-CM | POA: Diagnosis not present

## 2020-04-13 DIAGNOSIS — L578 Other skin changes due to chronic exposure to nonionizing radiation: Secondary | ICD-10-CM | POA: Diagnosis not present

## 2020-04-13 DIAGNOSIS — Z85828 Personal history of other malignant neoplasm of skin: Secondary | ICD-10-CM | POA: Diagnosis not present

## 2020-04-13 DIAGNOSIS — Z85820 Personal history of malignant melanoma of skin: Secondary | ICD-10-CM | POA: Diagnosis not present

## 2020-05-04 DIAGNOSIS — M109 Gout, unspecified: Secondary | ICD-10-CM | POA: Diagnosis not present

## 2020-05-04 DIAGNOSIS — R55 Syncope and collapse: Secondary | ICD-10-CM | POA: Diagnosis not present

## 2020-05-04 DIAGNOSIS — I951 Orthostatic hypotension: Secondary | ICD-10-CM | POA: Diagnosis not present

## 2020-05-26 DIAGNOSIS — M1990 Unspecified osteoarthritis, unspecified site: Secondary | ICD-10-CM | POA: Diagnosis not present

## 2020-05-26 DIAGNOSIS — R7309 Other abnormal glucose: Secondary | ICD-10-CM | POA: Diagnosis not present

## 2020-05-26 DIAGNOSIS — I10 Essential (primary) hypertension: Secondary | ICD-10-CM | POA: Diagnosis not present

## 2020-05-26 DIAGNOSIS — M109 Gout, unspecified: Secondary | ICD-10-CM | POA: Diagnosis not present

## 2020-06-01 DIAGNOSIS — E1142 Type 2 diabetes mellitus with diabetic polyneuropathy: Secondary | ICD-10-CM | POA: Diagnosis not present

## 2020-06-01 DIAGNOSIS — I10 Essential (primary) hypertension: Secondary | ICD-10-CM | POA: Diagnosis not present

## 2020-06-01 DIAGNOSIS — Z7984 Long term (current) use of oral hypoglycemic drugs: Secondary | ICD-10-CM | POA: Diagnosis not present

## 2020-06-04 DIAGNOSIS — M67371 Transient synovitis, right ankle and foot: Secondary | ICD-10-CM | POA: Diagnosis not present

## 2020-06-07 DIAGNOSIS — M67371 Transient synovitis, right ankle and foot: Secondary | ICD-10-CM | POA: Diagnosis not present

## 2020-06-09 DIAGNOSIS — M67371 Transient synovitis, right ankle and foot: Secondary | ICD-10-CM | POA: Diagnosis not present

## 2020-06-14 DIAGNOSIS — M84374A Stress fracture, right foot, initial encounter for fracture: Secondary | ICD-10-CM | POA: Diagnosis not present

## 2020-06-22 DIAGNOSIS — K5900 Constipation, unspecified: Secondary | ICD-10-CM | POA: Diagnosis not present

## 2020-06-23 ENCOUNTER — Other Ambulatory Visit: Payer: Self-pay | Admitting: Gastroenterology

## 2020-06-23 DIAGNOSIS — Z86010 Personal history of colonic polyps: Secondary | ICD-10-CM

## 2020-07-08 DIAGNOSIS — E119 Type 2 diabetes mellitus without complications: Secondary | ICD-10-CM | POA: Diagnosis not present

## 2020-07-08 DIAGNOSIS — M79673 Pain in unspecified foot: Secondary | ICD-10-CM | POA: Diagnosis not present

## 2020-07-08 DIAGNOSIS — D692 Other nonthrombocytopenic purpura: Secondary | ICD-10-CM | POA: Diagnosis not present

## 2020-07-08 DIAGNOSIS — N401 Enlarged prostate with lower urinary tract symptoms: Secondary | ICD-10-CM | POA: Diagnosis not present

## 2020-07-08 DIAGNOSIS — E1169 Type 2 diabetes mellitus with other specified complication: Secondary | ICD-10-CM | POA: Diagnosis not present

## 2020-07-08 DIAGNOSIS — T148XXA Other injury of unspecified body region, initial encounter: Secondary | ICD-10-CM | POA: Diagnosis not present

## 2020-07-13 DIAGNOSIS — M84374D Stress fracture, right foot, subsequent encounter for fracture with routine healing: Secondary | ICD-10-CM | POA: Diagnosis not present

## 2020-07-21 DIAGNOSIS — K5900 Constipation, unspecified: Secondary | ICD-10-CM | POA: Diagnosis not present

## 2020-07-21 DIAGNOSIS — R350 Frequency of micturition: Secondary | ICD-10-CM | POA: Diagnosis not present

## 2020-07-21 DIAGNOSIS — R102 Pelvic and perineal pain: Secondary | ICD-10-CM | POA: Diagnosis not present

## 2020-08-03 ENCOUNTER — Ambulatory Visit
Admission: RE | Admit: 2020-08-03 | Discharge: 2020-08-03 | Disposition: A | Discharge status: Home / Self Care | Payer: Medicare HMO | Source: Ambulatory Visit | Attending: Gastroenterology | Admitting: Gastroenterology

## 2020-08-03 DIAGNOSIS — Z86010 Personal history of colonic polyps: Secondary | ICD-10-CM

## 2020-08-03 DIAGNOSIS — K5730 Diverticulosis of large intestine without perforation or abscess without bleeding: Secondary | ICD-10-CM | POA: Diagnosis not present

## 2020-08-03 DIAGNOSIS — K6389 Other specified diseases of intestine: Secondary | ICD-10-CM | POA: Diagnosis not present

## 2020-08-03 DIAGNOSIS — I714 Abdominal aortic aneurysm, without rupture: Secondary | ICD-10-CM | POA: Diagnosis not present

## 2020-08-24 DIAGNOSIS — M19071 Primary osteoarthritis, right ankle and foot: Secondary | ICD-10-CM | POA: Diagnosis not present

## 2020-08-24 DIAGNOSIS — M67371 Transient synovitis, right ankle and foot: Secondary | ICD-10-CM | POA: Diagnosis not present

## 2020-08-25 DIAGNOSIS — M67371 Transient synovitis, right ankle and foot: Secondary | ICD-10-CM | POA: Diagnosis not present

## 2020-08-31 DIAGNOSIS — M67371 Transient synovitis, right ankle and foot: Secondary | ICD-10-CM | POA: Diagnosis not present

## 2020-09-03 DIAGNOSIS — M67371 Transient synovitis, right ankle and foot: Secondary | ICD-10-CM | POA: Diagnosis not present

## 2020-09-22 DIAGNOSIS — M25571 Pain in right ankle and joints of right foot: Secondary | ICD-10-CM | POA: Diagnosis not present

## 2020-10-04 ENCOUNTER — Other Ambulatory Visit: Payer: Self-pay

## 2020-10-04 ENCOUNTER — Encounter (HOSPITAL_BASED_OUTPATIENT_CLINIC_OR_DEPARTMENT_OTHER): Payer: Self-pay | Admitting: *Deleted

## 2020-10-04 ENCOUNTER — Emergency Department (HOSPITAL_BASED_OUTPATIENT_CLINIC_OR_DEPARTMENT_OTHER)
Admission: EM | Admit: 2020-10-04 | Discharge: 2020-10-04 | Disposition: A | Discharge status: Home / Self Care | Payer: Medicare HMO | Attending: Emergency Medicine | Admitting: Emergency Medicine

## 2020-10-04 DIAGNOSIS — Z7982 Long term (current) use of aspirin: Secondary | ICD-10-CM | POA: Diagnosis not present

## 2020-10-04 DIAGNOSIS — Z23 Encounter for immunization: Secondary | ICD-10-CM | POA: Insufficient documentation

## 2020-10-04 DIAGNOSIS — I10 Essential (primary) hypertension: Secondary | ICD-10-CM | POA: Insufficient documentation

## 2020-10-04 DIAGNOSIS — Z85820 Personal history of malignant melanoma of skin: Secondary | ICD-10-CM | POA: Diagnosis not present

## 2020-10-04 DIAGNOSIS — W01198A Fall on same level from slipping, tripping and stumbling with subsequent striking against other object, initial encounter: Secondary | ICD-10-CM | POA: Diagnosis not present

## 2020-10-04 DIAGNOSIS — S81812A Laceration without foreign body, left lower leg, initial encounter: Secondary | ICD-10-CM | POA: Insufficient documentation

## 2020-10-04 DIAGNOSIS — S8992XA Unspecified injury of left lower leg, initial encounter: Secondary | ICD-10-CM | POA: Diagnosis present

## 2020-10-04 DIAGNOSIS — Z87891 Personal history of nicotine dependence: Secondary | ICD-10-CM | POA: Insufficient documentation

## 2020-10-04 MED ORDER — TETANUS-DIPHTH-ACELL PERTUSSIS 5-2.5-18.5 LF-MCG/0.5 IM SUSY
0.5000 mL | PREFILLED_SYRINGE | Freq: Once | INTRAMUSCULAR | Status: AC
  Administered 2020-10-04: 0.5 mL via INTRAMUSCULAR
  Filled 2020-10-04: qty 0.5

## 2020-10-04 MED ORDER — LIDOCAINE-EPINEPHRINE (PF) 2 %-1:200000 IJ SOLN
10.0000 mL | Freq: Once | INTRAMUSCULAR | Status: AC
  Administered 2020-10-04: 10 mL
  Filled 2020-10-04: qty 20

## 2020-10-04 NOTE — ED Provider Notes (Signed)
[facility] Provider Note  CSN: [id number] Arrival date & time: 10/04/20 1303    History Chief Complaint  Patient presents with   Laceration    Christopher Acevedo is a 80 y.o. male reports he stumbled on a pillow and fell while making the bed this morning, hitting his L leg on a cedar chest and sustaining a laceration to his L anterior shin. He put a dressing on it, but after a shower was havign trouble getting it ot stop bleeding. He is on aspirin, no other anticoagulation. He is unsure last TDAP.    Past Medical History:  Diagnosis Date   Actinic keratosis    DRY SKIN WITH LESIONS- MOSTLY ON FEET AND LOWER LEGS   Arthritis    Cancer ([place])    nose basal cell carcinoma, neck melanoma   Chronic sinusitis    nasal polyp   Diverticulosis    LOWER ABD PAIN AND DIARRHEA - MOST RECENT EPISODE WAS 11/25/13- BETTER TODAY   Dry eyes    Elevated cholesterol    Environmental and seasonal allergies    GERD (gastroesophageal reflux disease)    Glaucoma    LEFT EYE   Gout    Hearing loss    BOTH EARS- HAS HEARING AIDS   Hypertension    Pre-diabetes    Sleep apnea    uses nightly   Spasm of eyelids    PT GET[facility]    Past Surgical History:  Procedure Laterality Date   BLEPHROPLASTY Bilateral    COLONOSCOPY W/ BIOPSIES AND POLYPECTOMY     CYST EXCISION Left 03/04/2020   Procedure: MUCOID CYST EXCISION WITH INTERPHALANGEAL JOINT DEBRIDEMENT;  Surgeon: Daryll Brod, MD;  Location: [facility];  Service: Orthopedics;  Laterality: Left;  FOREARM BLOCK   HERNIA REPAIR  1990s?   NASAL SINUS SURGERY     POLYPECTOMY N/A 03/10/2016   Procedure: POLYPECTOMY NASAL;  Surgeon: Jerrell Belfast, MD;  Location: [facility];  Service: ENT;  Laterality: N/A;   RIGHT SHOULDER SURGERY     RIGHT THUMB AND INDEX FINGER SURGERY FOR ARTHRITIS     SINUS ENDO WITH FUSION Bilateral 03/10/2016   Procedure:  BILATERAL REVISION SINUS ENDOSCOPIC WITH FUSION;  Surgeon: Jerrell Belfast, MD;  Location: [facility] OR;  Service: ENT;  Laterality: Bilateral;   TRANSANAL HEMORRHOIDAL DEARTERIALIZATION N/A 12/02/2013   Procedure: TRANSANAL HEMORRHOIDAL DEARTERIALIZATION AND LIGATION/PEXY, EXTERNAL HEMORRHOIDECTOMY, REMOVAL OF GLUTEAL MASS;  Surgeon: Michael Boston, MD;  Location: [facility];  Service: General;  Laterality: N/A;    Family History  Problem Relation Age of Onset   Rheum arthritis Mother    Allergic rhinitis Neg Hx    Angioedema Neg Hx    Asthma Neg Hx    Atopy Neg Hx    Eczema Neg Hx    Immunodeficiency Neg Hx    Urticaria Neg Hx     Social History   Tobacco Use   Smoking status: Former    Packs/day: 1.00    Years: 35.00    Pack years: 35.00    Types: Cigarettes    Quit date: 01/24/2004    Years since quitting: 16.7   Smokeless tobacco: Former    Types: Snuff, Chew  Vaping Use   Vaping Use: Never used  Substance Use Topics   Alcohol use: Yes    Alcohol/week: 4.0 standard drinks    Types: 4 Shots of liquor per week  Comment: 4 shots of liquor each week   Drug use: No     Home Medications Prior to Admission medications   Medication Sig Start Date End Date Taking? Authorizing Provider  allopurinol (ZYLOPRIM) 100 MG tablet Take 100 mg by mouth daily.   Yes [provider]  aspirin EC 81 MG tablet Take 81 mg by mouth daily. Swallow whole.   Yes [provider]  atorvastatin (LIPITOR) 40 MG tablet Take 40 mg by mouth daily at 6 PM. Takes 1/2 tablet daily   Yes [provider]  cholecalciferol (VITAMIN D) 1000 UNITS tablet Take 1,000 Units by mouth every morning. Reported on 07/08/2015   Yes [provider]  cycloSPORINE (RESTASIS) 0.05 % ophthalmic emulsion Place 1 drop into both eyes 2 (two) times daily.   Yes [provider]  MegaRed Omega-3 Krill Oil 500 MG CAPS Take by mouth.   Yes [provider]  Turmeric 450 MG CAPS Take by  mouth.   Yes [provider]  Meloxicam 7.5 MG TBDP Take by mouth.    [provider]  traMADol (ULTRAM) 50 MG tablet Take 50 mg by mouth every 12 (twelve) hours as needed for moderate pain.     [provider]     Allergies    Codeine   Review of Systems   Review of Systems A comprehensive review of systems was completed and negative except as noted in HPI.    Physical Exam BP (!) 146/87 (BP Location: Right Arm)   Pulse 61   Temp 98.2 F (36.8 C)   Resp 16   Ht 6' (1.829 m)   Wt 113.4 kg   SpO2 99%   BMI 33.91 kg/m   Physical Exam Vitals and nursing note reviewed.  HENT:     Head: Normocephalic.     Nose: Nose normal.  Eyes:     Extraocular Movements: Extraocular movements intact.  Pulmonary:     Effort: Pulmonary effort is normal.  Musculoskeletal:        General: Normal range of motion.     Cervical back: Neck supple.  Skin:    Findings: No rash (on exposed skin).     Comments: 6cm curvilinear skin tear with exposed fat on the L anterior shin  Neurological:     Mental Status: He is alert and oriented to person, place, and time.  Psychiatric:        Mood and Affect: Mood normal.      ED Results / Procedures / Treatments   Labs (all labs ordered are listed, but only abnormal results are displayed) Labs Reviewed - No data to display  EKG None   Radiology No results found.  Procedures .Marland KitchenLaceration Repair  Date/Time: 10/04/2020 5:06 PM Performed by: Truddie Hidden, MD Authorized by: Truddie Hidden, MD   Consent:    Consent obtained:  Verbal   Consent given by:  Patient Universal protocol:    Patient identity confirmed:  Verbally with patient Anesthesia:    Anesthesia method:  Local infiltration   Local anesthetic:  Lidocaine 2% WITH epi Laceration details:    Location:  Leg   Leg location:  L lower leg   Length (cm):  6 Pre-procedure details:    Preparation:  Patient was prepped and draped in usual  sterile fashion Treatment:    Area cleansed with:  Povidone-iodine   Amount of cleaning:  Standard   Irrigation solution:  Sterile saline   Irrigation method:  Syringe  Skin repair:    Repair method:  Sutures   Suture size:  4-0   Suture material:  Nylon   Suture technique:  Simple interrupted   Number of sutures:  6 Approximation:    Approximation:  Close Repair type:    Repair type:  Simple Post-procedure details:    Dressing:  Non-adherent dressing   Procedure completion:  Tolerated well, no immediate complications  Medications Ordered in the ED Medications  lidocaine-EPINEPHrine (XYLOCAINE W/EPI) 2 %-1:200000 (PF) injection 10 mL (10 mLs Infiltration Given by Other 10/04/20 1613)  Tdap (BOOSTRIX) injection 0.5 mL (0.5 mLs Intramuscular Given 10/04/20 1613)     MDM Rules/Calculators/A&P MDM   ED Course  I have reviewed the triage vital signs and the nursing notes.  Pertinent labs & imaging results that were available during my care of the patient were reviewed by me and considered in my medical decision making (see chart for details).     Final Clinical Impression(s) / ED Diagnoses Final diagnoses:  Laceration of left lower extremity, initial encounter    Rx / DC Orders ED Discharge Orders     None        Truddie Hidden, MD 10/04/20 1707

## 2020-10-04 NOTE — ED Triage Notes (Signed)
Patient fell this morning and hit his left leg on a cedar chest.  Patient is taking ASA 81 mg.

## 2020-10-04 NOTE — ED Notes (Signed)
Pt discharged home after verbalizing understanding of discharge instructions; nad noted. 

## 2020-10-04 NOTE — ED Notes (Signed)
Pt from home; hit is lower left leg on a chest when he fell this morning and has laceration and skin tear. Pt takes aspirin daily and was concerned that it was "bleeding too much." Bleeding controlled at this time. Pt denies hitting his head or losing consciousness. Pt alert & oriented, nad noted.

## 2020-10-12 DIAGNOSIS — C439 Malignant melanoma of skin, unspecified: Secondary | ICD-10-CM | POA: Diagnosis not present

## 2020-10-12 DIAGNOSIS — D485 Neoplasm of uncertain behavior of skin: Secondary | ICD-10-CM | POA: Diagnosis not present

## 2020-10-12 DIAGNOSIS — L821 Other seborrheic keratosis: Secondary | ICD-10-CM | POA: Diagnosis not present

## 2020-10-12 DIAGNOSIS — L570 Actinic keratosis: Secondary | ICD-10-CM | POA: Diagnosis not present

## 2020-10-12 DIAGNOSIS — L814 Other melanin hyperpigmentation: Secondary | ICD-10-CM | POA: Diagnosis not present

## 2020-10-12 DIAGNOSIS — Z08 Encounter for follow-up examination after completed treatment for malignant neoplasm: Secondary | ICD-10-CM | POA: Diagnosis not present

## 2020-10-12 DIAGNOSIS — D225 Melanocytic nevi of trunk: Secondary | ICD-10-CM | POA: Diagnosis not present

## 2020-10-12 DIAGNOSIS — Z85820 Personal history of malignant melanoma of skin: Secondary | ICD-10-CM | POA: Diagnosis not present

## 2020-10-14 DIAGNOSIS — L0390 Cellulitis, unspecified: Secondary | ICD-10-CM | POA: Diagnosis not present

## 2020-10-14 DIAGNOSIS — S81812A Laceration without foreign body, left lower leg, initial encounter: Secondary | ICD-10-CM | POA: Diagnosis not present

## 2020-10-14 DIAGNOSIS — Z23 Encounter for immunization: Secondary | ICD-10-CM | POA: Diagnosis not present

## 2020-10-24 DIAGNOSIS — H60391 Other infective otitis externa, right ear: Secondary | ICD-10-CM | POA: Diagnosis not present

## 2020-10-25 DIAGNOSIS — M25571 Pain in right ankle and joints of right foot: Secondary | ICD-10-CM | POA: Diagnosis not present

## 2020-10-26 DIAGNOSIS — L570 Actinic keratosis: Secondary | ICD-10-CM | POA: Diagnosis not present

## 2020-10-29 ENCOUNTER — Other Ambulatory Visit: Payer: Self-pay | Admitting: Orthopaedic Surgery

## 2020-10-29 DIAGNOSIS — M25571 Pain in right ankle and joints of right foot: Secondary | ICD-10-CM

## 2020-11-03 ENCOUNTER — Ambulatory Visit
Admission: RE | Admit: 2020-11-03 | Discharge: 2020-11-03 | Disposition: A | Discharge status: Home / Self Care | Payer: Medicare HMO | Source: Ambulatory Visit | Attending: Orthopaedic Surgery | Admitting: Orthopaedic Surgery

## 2020-11-03 ENCOUNTER — Other Ambulatory Visit: Payer: Self-pay

## 2020-11-03 DIAGNOSIS — M25571 Pain in right ankle and joints of right foot: Secondary | ICD-10-CM

## 2020-11-03 DIAGNOSIS — M7989 Other specified soft tissue disorders: Secondary | ICD-10-CM | POA: Diagnosis not present

## 2020-11-03 DIAGNOSIS — M19071 Primary osteoarthritis, right ankle and foot: Secondary | ICD-10-CM | POA: Diagnosis not present

## 2020-11-03 DIAGNOSIS — M7731 Calcaneal spur, right foot: Secondary | ICD-10-CM | POA: Diagnosis not present

## 2020-11-04 ENCOUNTER — Other Ambulatory Visit: Payer: Medicare HMO

## 2020-11-05 DIAGNOSIS — H04123 Dry eye syndrome of bilateral lacrimal glands: Secondary | ICD-10-CM | POA: Diagnosis not present

## 2020-11-05 DIAGNOSIS — H40013 Open angle with borderline findings, low risk, bilateral: Secondary | ICD-10-CM | POA: Diagnosis not present

## 2020-11-05 DIAGNOSIS — Z961 Presence of intraocular lens: Secondary | ICD-10-CM | POA: Diagnosis not present

## 2020-11-12 DIAGNOSIS — M25571 Pain in right ankle and joints of right foot: Secondary | ICD-10-CM | POA: Diagnosis not present

## 2020-12-07 DIAGNOSIS — H6123 Impacted cerumen, bilateral: Secondary | ICD-10-CM | POA: Diagnosis not present

## 2020-12-07 DIAGNOSIS — I10 Essential (primary) hypertension: Secondary | ICD-10-CM | POA: Diagnosis not present

## 2020-12-09 DIAGNOSIS — H6123 Impacted cerumen, bilateral: Secondary | ICD-10-CM | POA: Diagnosis not present

## 2020-12-14 DIAGNOSIS — S86391A Other injury of muscle(s) and tendon(s) of peroneal muscle group at lower leg level, right leg, initial encounter: Secondary | ICD-10-CM | POA: Diagnosis not present

## 2021-01-31 DIAGNOSIS — M25571 Pain in right ankle and joints of right foot: Secondary | ICD-10-CM | POA: Diagnosis not present

## 2021-01-31 DIAGNOSIS — R2231 Localized swelling, mass and lump, right upper limb: Secondary | ICD-10-CM | POA: Diagnosis not present

## 2021-01-31 DIAGNOSIS — M79644 Pain in right finger(s): Secondary | ICD-10-CM | POA: Diagnosis not present

## 2021-01-31 DIAGNOSIS — M79671 Pain in right foot: Secondary | ICD-10-CM | POA: Diagnosis not present

## 2021-01-31 DIAGNOSIS — M19041 Primary osteoarthritis, right hand: Secondary | ICD-10-CM | POA: Diagnosis not present

## 2021-02-01 DIAGNOSIS — S86311D Strain of muscle(s) and tendon(s) of peroneal muscle group at lower leg level, right leg, subsequent encounter: Secondary | ICD-10-CM | POA: Diagnosis not present

## 2021-02-09 DIAGNOSIS — R2231 Localized swelling, mass and lump, right upper limb: Secondary | ICD-10-CM | POA: Diagnosis not present

## 2021-02-18 DIAGNOSIS — M65321 Trigger finger, right index finger: Secondary | ICD-10-CM | POA: Diagnosis not present

## 2021-02-18 DIAGNOSIS — R2231 Localized swelling, mass and lump, right upper limb: Secondary | ICD-10-CM | POA: Diagnosis not present

## 2021-03-03 DIAGNOSIS — M25871 Other specified joint disorders, right ankle and foot: Secondary | ICD-10-CM | POA: Diagnosis not present

## 2021-03-03 DIAGNOSIS — G8918 Other acute postprocedural pain: Secondary | ICD-10-CM | POA: Diagnosis not present

## 2021-03-03 DIAGNOSIS — M21171 Varus deformity, not elsewhere classified, right ankle: Secondary | ICD-10-CM | POA: Diagnosis not present

## 2021-03-03 DIAGNOSIS — S86391A Other injury of muscle(s) and tendon(s) of peroneal muscle group at lower leg level, right leg, initial encounter: Secondary | ICD-10-CM | POA: Diagnosis not present

## 2021-03-03 DIAGNOSIS — M25371 Other instability, right ankle: Secondary | ICD-10-CM | POA: Diagnosis not present

## 2021-03-03 DIAGNOSIS — S86311A Strain of muscle(s) and tendon(s) of peroneal muscle group at lower leg level, right leg, initial encounter: Secondary | ICD-10-CM | POA: Diagnosis not present

## 2021-03-03 DIAGNOSIS — M19071 Primary osteoarthritis, right ankle and foot: Secondary | ICD-10-CM | POA: Diagnosis not present

## 2021-03-18 DIAGNOSIS — Z9889 Other specified postprocedural states: Secondary | ICD-10-CM | POA: Diagnosis not present

## 2021-03-18 DIAGNOSIS — M25571 Pain in right ankle and joints of right foot: Secondary | ICD-10-CM | POA: Diagnosis not present

## 2021-03-30 DIAGNOSIS — E1169 Type 2 diabetes mellitus with other specified complication: Secondary | ICD-10-CM | POA: Diagnosis not present

## 2021-03-30 DIAGNOSIS — N401 Enlarged prostate with lower urinary tract symptoms: Secondary | ICD-10-CM | POA: Diagnosis not present

## 2021-03-30 DIAGNOSIS — I10 Essential (primary) hypertension: Secondary | ICD-10-CM | POA: Diagnosis not present

## 2021-04-15 DIAGNOSIS — M7671 Peroneal tendinitis, right leg: Secondary | ICD-10-CM | POA: Diagnosis not present

## 2021-04-15 DIAGNOSIS — Z9889 Other specified postprocedural states: Secondary | ICD-10-CM | POA: Diagnosis not present

## 2021-04-28 DIAGNOSIS — M25571 Pain in right ankle and joints of right foot: Secondary | ICD-10-CM | POA: Diagnosis not present

## 2021-05-10 DIAGNOSIS — M25571 Pain in right ankle and joints of right foot: Secondary | ICD-10-CM | POA: Diagnosis not present

## 2021-05-13 DIAGNOSIS — M25571 Pain in right ankle and joints of right foot: Secondary | ICD-10-CM | POA: Diagnosis not present

## 2021-05-17 DIAGNOSIS — I714 Abdominal aortic aneurysm, without rupture, unspecified: Secondary | ICD-10-CM | POA: Diagnosis not present

## 2021-05-17 DIAGNOSIS — Z79899 Other long term (current) drug therapy: Secondary | ICD-10-CM | POA: Diagnosis not present

## 2021-05-17 DIAGNOSIS — N401 Enlarged prostate with lower urinary tract symptoms: Secondary | ICD-10-CM | POA: Diagnosis not present

## 2021-05-17 DIAGNOSIS — C439 Malignant melanoma of skin, unspecified: Secondary | ICD-10-CM | POA: Diagnosis not present

## 2021-05-17 DIAGNOSIS — C4491 Basal cell carcinoma of skin, unspecified: Secondary | ICD-10-CM | POA: Diagnosis not present

## 2021-05-17 DIAGNOSIS — I10 Essential (primary) hypertension: Secondary | ICD-10-CM | POA: Diagnosis not present

## 2021-05-17 DIAGNOSIS — Z0000 Encounter for general adult medical examination without abnormal findings: Secondary | ICD-10-CM | POA: Diagnosis not present

## 2021-05-17 DIAGNOSIS — E119 Type 2 diabetes mellitus without complications: Secondary | ICD-10-CM | POA: Diagnosis not present

## 2021-05-17 DIAGNOSIS — E1169 Type 2 diabetes mellitus with other specified complication: Secondary | ICD-10-CM | POA: Diagnosis not present

## 2021-05-17 DIAGNOSIS — M25571 Pain in right ankle and joints of right foot: Secondary | ICD-10-CM | POA: Diagnosis not present

## 2021-05-17 DIAGNOSIS — I2510 Atherosclerotic heart disease of native coronary artery without angina pectoris: Secondary | ICD-10-CM | POA: Diagnosis not present

## 2021-05-19 DIAGNOSIS — M25571 Pain in right ankle and joints of right foot: Secondary | ICD-10-CM | POA: Diagnosis not present

## 2021-05-24 DIAGNOSIS — M25571 Pain in right ankle and joints of right foot: Secondary | ICD-10-CM | POA: Diagnosis not present

## 2021-05-25 DIAGNOSIS — M7671 Peroneal tendinitis, right leg: Secondary | ICD-10-CM | POA: Diagnosis not present

## 2021-05-26 DIAGNOSIS — M25571 Pain in right ankle and joints of right foot: Secondary | ICD-10-CM | POA: Diagnosis not present

## 2021-06-01 DIAGNOSIS — M25571 Pain in right ankle and joints of right foot: Secondary | ICD-10-CM | POA: Diagnosis not present

## 2021-06-03 DIAGNOSIS — M25571 Pain in right ankle and joints of right foot: Secondary | ICD-10-CM | POA: Diagnosis not present

## 2021-06-09 DIAGNOSIS — M25571 Pain in right ankle and joints of right foot: Secondary | ICD-10-CM | POA: Diagnosis not present

## 2021-06-13 DIAGNOSIS — M25571 Pain in right ankle and joints of right foot: Secondary | ICD-10-CM | POA: Diagnosis not present

## 2021-06-15 DIAGNOSIS — I2510 Atherosclerotic heart disease of native coronary artery without angina pectoris: Secondary | ICD-10-CM | POA: Diagnosis not present

## 2021-06-15 DIAGNOSIS — I10 Essential (primary) hypertension: Secondary | ICD-10-CM | POA: Diagnosis not present

## 2021-06-15 DIAGNOSIS — F324 Major depressive disorder, single episode, in partial remission: Secondary | ICD-10-CM | POA: Diagnosis not present

## 2021-06-15 DIAGNOSIS — K219 Gastro-esophageal reflux disease without esophagitis: Secondary | ICD-10-CM | POA: Diagnosis not present

## 2021-06-15 DIAGNOSIS — E1169 Type 2 diabetes mellitus with other specified complication: Secondary | ICD-10-CM | POA: Diagnosis not present

## 2021-06-15 DIAGNOSIS — N401 Enlarged prostate with lower urinary tract symptoms: Secondary | ICD-10-CM | POA: Diagnosis not present

## 2021-06-16 DIAGNOSIS — M25571 Pain in right ankle and joints of right foot: Secondary | ICD-10-CM | POA: Diagnosis not present

## 2021-06-21 DIAGNOSIS — K5909 Other constipation: Secondary | ICD-10-CM | POA: Diagnosis not present

## 2021-06-21 DIAGNOSIS — F324 Major depressive disorder, single episode, in partial remission: Secondary | ICD-10-CM | POA: Diagnosis not present

## 2021-06-21 DIAGNOSIS — M25571 Pain in right ankle and joints of right foot: Secondary | ICD-10-CM | POA: Diagnosis not present

## 2021-06-21 DIAGNOSIS — M25471 Effusion, right ankle: Secondary | ICD-10-CM | POA: Diagnosis not present

## 2021-06-23 DIAGNOSIS — M25571 Pain in right ankle and joints of right foot: Secondary | ICD-10-CM | POA: Diagnosis not present

## 2021-06-28 DIAGNOSIS — M25571 Pain in right ankle and joints of right foot: Secondary | ICD-10-CM | POA: Diagnosis not present

## 2021-06-30 DIAGNOSIS — M25571 Pain in right ankle and joints of right foot: Secondary | ICD-10-CM | POA: Diagnosis not present

## 2021-07-04 DIAGNOSIS — Z85820 Personal history of malignant melanoma of skin: Secondary | ICD-10-CM | POA: Diagnosis not present

## 2021-07-04 DIAGNOSIS — D225 Melanocytic nevi of trunk: Secondary | ICD-10-CM | POA: Diagnosis not present

## 2021-07-04 DIAGNOSIS — L814 Other melanin hyperpigmentation: Secondary | ICD-10-CM | POA: Diagnosis not present

## 2021-07-04 DIAGNOSIS — Z85828 Personal history of other malignant neoplasm of skin: Secondary | ICD-10-CM | POA: Diagnosis not present

## 2021-07-04 DIAGNOSIS — C434 Malignant melanoma of scalp and neck: Secondary | ICD-10-CM | POA: Diagnosis not present

## 2021-07-04 DIAGNOSIS — L570 Actinic keratosis: Secondary | ICD-10-CM | POA: Diagnosis not present

## 2021-07-04 DIAGNOSIS — L821 Other seborrheic keratosis: Secondary | ICD-10-CM | POA: Diagnosis not present

## 2021-07-04 DIAGNOSIS — L720 Epidermal cyst: Secondary | ICD-10-CM | POA: Diagnosis not present

## 2021-07-04 DIAGNOSIS — D0339 Melanoma in situ of other parts of face: Secondary | ICD-10-CM | POA: Diagnosis not present

## 2021-07-04 DIAGNOSIS — Z08 Encounter for follow-up examination after completed treatment for malignant neoplasm: Secondary | ICD-10-CM | POA: Diagnosis not present

## 2021-07-05 DIAGNOSIS — M25571 Pain in right ankle and joints of right foot: Secondary | ICD-10-CM | POA: Diagnosis not present

## 2021-07-07 DIAGNOSIS — M25571 Pain in right ankle and joints of right foot: Secondary | ICD-10-CM | POA: Diagnosis not present

## 2021-07-12 DIAGNOSIS — M25571 Pain in right ankle and joints of right foot: Secondary | ICD-10-CM | POA: Diagnosis not present

## 2021-07-14 DIAGNOSIS — M25571 Pain in right ankle and joints of right foot: Secondary | ICD-10-CM | POA: Diagnosis not present

## 2021-07-19 DIAGNOSIS — M25571 Pain in right ankle and joints of right foot: Secondary | ICD-10-CM | POA: Diagnosis not present

## 2021-07-21 DIAGNOSIS — M25571 Pain in right ankle and joints of right foot: Secondary | ICD-10-CM | POA: Diagnosis not present

## 2021-07-25 DIAGNOSIS — M25371 Other instability, right ankle: Secondary | ICD-10-CM | POA: Diagnosis not present

## 2021-07-28 DIAGNOSIS — M25571 Pain in right ankle and joints of right foot: Secondary | ICD-10-CM | POA: Diagnosis not present

## 2021-08-02 DIAGNOSIS — M25571 Pain in right ankle and joints of right foot: Secondary | ICD-10-CM | POA: Diagnosis not present

## 2021-08-05 DIAGNOSIS — M25571 Pain in right ankle and joints of right foot: Secondary | ICD-10-CM | POA: Diagnosis not present

## 2021-08-09 DIAGNOSIS — M25571 Pain in right ankle and joints of right foot: Secondary | ICD-10-CM | POA: Diagnosis not present

## 2021-08-11 DIAGNOSIS — M25571 Pain in right ankle and joints of right foot: Secondary | ICD-10-CM | POA: Diagnosis not present

## 2021-09-07 DIAGNOSIS — D0339 Melanoma in situ of other parts of face: Secondary | ICD-10-CM | POA: Diagnosis not present

## 2021-09-07 DIAGNOSIS — L989 Disorder of the skin and subcutaneous tissue, unspecified: Secondary | ICD-10-CM | POA: Diagnosis not present

## 2021-09-07 DIAGNOSIS — L905 Scar conditions and fibrosis of skin: Secondary | ICD-10-CM | POA: Diagnosis not present

## 2021-09-14 DIAGNOSIS — D034 Melanoma in situ of scalp and neck: Secondary | ICD-10-CM | POA: Diagnosis not present

## 2021-09-14 DIAGNOSIS — D0339 Melanoma in situ of other parts of face: Secondary | ICD-10-CM | POA: Diagnosis not present

## 2021-09-22 DIAGNOSIS — R5383 Other fatigue: Secondary | ICD-10-CM | POA: Diagnosis not present

## 2021-09-22 DIAGNOSIS — R42 Dizziness and giddiness: Secondary | ICD-10-CM | POA: Diagnosis not present

## 2021-09-22 DIAGNOSIS — I10 Essential (primary) hypertension: Secondary | ICD-10-CM | POA: Diagnosis not present

## 2021-10-04 DIAGNOSIS — I10 Essential (primary) hypertension: Secondary | ICD-10-CM | POA: Diagnosis not present

## 2021-10-04 DIAGNOSIS — F324 Major depressive disorder, single episode, in partial remission: Secondary | ICD-10-CM | POA: Diagnosis not present

## 2021-10-04 DIAGNOSIS — K219 Gastro-esophageal reflux disease without esophagitis: Secondary | ICD-10-CM | POA: Diagnosis not present

## 2021-10-04 DIAGNOSIS — N401 Enlarged prostate with lower urinary tract symptoms: Secondary | ICD-10-CM | POA: Diagnosis not present

## 2021-11-08 DIAGNOSIS — Z08 Encounter for follow-up examination after completed treatment for malignant neoplasm: Secondary | ICD-10-CM | POA: Diagnosis not present

## 2021-11-08 DIAGNOSIS — L821 Other seborrheic keratosis: Secondary | ICD-10-CM | POA: Diagnosis not present

## 2021-11-08 DIAGNOSIS — L814 Other melanin hyperpigmentation: Secondary | ICD-10-CM | POA: Diagnosis not present

## 2021-11-08 DIAGNOSIS — L570 Actinic keratosis: Secondary | ICD-10-CM | POA: Diagnosis not present

## 2021-11-08 DIAGNOSIS — Z09 Encounter for follow-up examination after completed treatment for conditions other than malignant neoplasm: Secondary | ICD-10-CM | POA: Diagnosis not present

## 2021-11-08 DIAGNOSIS — D225 Melanocytic nevi of trunk: Secondary | ICD-10-CM | POA: Diagnosis not present

## 2021-11-08 DIAGNOSIS — Z86006 Personal history of melanoma in-situ: Secondary | ICD-10-CM | POA: Diagnosis not present

## 2021-11-23 DIAGNOSIS — E785 Hyperlipidemia, unspecified: Secondary | ICD-10-CM | POA: Diagnosis not present

## 2021-11-23 DIAGNOSIS — E1169 Type 2 diabetes mellitus with other specified complication: Secondary | ICD-10-CM | POA: Diagnosis not present

## 2021-11-23 DIAGNOSIS — F324 Major depressive disorder, single episode, in partial remission: Secondary | ICD-10-CM | POA: Diagnosis not present

## 2021-12-21 DIAGNOSIS — M25552 Pain in left hip: Secondary | ICD-10-CM | POA: Diagnosis not present

## 2021-12-30 DIAGNOSIS — M25552 Pain in left hip: Secondary | ICD-10-CM | POA: Diagnosis not present

## 2021-12-30 DIAGNOSIS — M545 Low back pain, unspecified: Secondary | ICD-10-CM | POA: Diagnosis not present

## 2022-01-04 ENCOUNTER — Other Ambulatory Visit (HOSPITAL_BASED_OUTPATIENT_CLINIC_OR_DEPARTMENT_OTHER): Payer: Self-pay | Admitting: Family Medicine

## 2022-01-04 DIAGNOSIS — M25552 Pain in left hip: Secondary | ICD-10-CM

## 2022-01-04 DIAGNOSIS — M545 Low back pain, unspecified: Secondary | ICD-10-CM | POA: Diagnosis not present

## 2022-01-05 DIAGNOSIS — R399 Unspecified symptoms and signs involving the genitourinary system: Secondary | ICD-10-CM | POA: Diagnosis not present

## 2022-01-05 DIAGNOSIS — R339 Retention of urine, unspecified: Secondary | ICD-10-CM | POA: Diagnosis not present

## 2022-01-11 DIAGNOSIS — M545 Low back pain, unspecified: Secondary | ICD-10-CM | POA: Diagnosis not present

## 2022-01-11 DIAGNOSIS — M25552 Pain in left hip: Secondary | ICD-10-CM | POA: Diagnosis not present

## 2022-01-13 DIAGNOSIS — H0100B Unspecified blepharitis left eye, upper and lower eyelids: Secondary | ICD-10-CM | POA: Diagnosis not present

## 2022-01-13 DIAGNOSIS — H02831 Dermatochalasis of right upper eyelid: Secondary | ICD-10-CM | POA: Diagnosis not present

## 2022-01-13 DIAGNOSIS — M25552 Pain in left hip: Secondary | ICD-10-CM | POA: Diagnosis not present

## 2022-01-13 DIAGNOSIS — H0100A Unspecified blepharitis right eye, upper and lower eyelids: Secondary | ICD-10-CM | POA: Diagnosis not present

## 2022-01-13 DIAGNOSIS — H40023 Open angle with borderline findings, high risk, bilateral: Secondary | ICD-10-CM | POA: Diagnosis not present

## 2022-01-13 DIAGNOSIS — M545 Low back pain, unspecified: Secondary | ICD-10-CM | POA: Diagnosis not present

## 2022-01-13 DIAGNOSIS — H524 Presbyopia: Secondary | ICD-10-CM | POA: Diagnosis not present

## 2022-01-13 DIAGNOSIS — H02834 Dermatochalasis of left upper eyelid: Secondary | ICD-10-CM | POA: Diagnosis not present

## 2022-01-19 DIAGNOSIS — J189 Pneumonia, unspecified organism: Secondary | ICD-10-CM | POA: Diagnosis not present

## 2022-01-19 DIAGNOSIS — Z03818 Encounter for observation for suspected exposure to other biological agents ruled out: Secondary | ICD-10-CM | POA: Diagnosis not present

## 2022-01-19 DIAGNOSIS — R051 Acute cough: Secondary | ICD-10-CM | POA: Diagnosis not present

## 2022-01-25 DIAGNOSIS — M25552 Pain in left hip: Secondary | ICD-10-CM | POA: Diagnosis not present

## 2022-01-25 DIAGNOSIS — M545 Low back pain, unspecified: Secondary | ICD-10-CM | POA: Diagnosis not present

## 2022-01-27 DIAGNOSIS — M545 Low back pain, unspecified: Secondary | ICD-10-CM | POA: Diagnosis not present

## 2022-01-27 DIAGNOSIS — M25552 Pain in left hip: Secondary | ICD-10-CM | POA: Diagnosis not present

## 2022-01-31 DIAGNOSIS — M545 Low back pain, unspecified: Secondary | ICD-10-CM | POA: Diagnosis not present

## 2022-02-10 DIAGNOSIS — M545 Low back pain, unspecified: Secondary | ICD-10-CM | POA: Diagnosis not present

## 2022-02-10 DIAGNOSIS — M25552 Pain in left hip: Secondary | ICD-10-CM | POA: Diagnosis not present

## 2022-02-13 ENCOUNTER — Encounter: Payer: Self-pay | Admitting: *Deleted

## 2022-02-13 ENCOUNTER — Telehealth: Payer: Self-pay | Admitting: *Deleted

## 2022-02-13 NOTE — Patient Instructions (Signed)
Visit Information  Thank you for taking time to visit with me today. Please don't hesitate to contact me if I can be of assistance to you.   Following are the goals we discussed today:   Goals Addressed             This Visit's Progress    COMPLETED: care coordination activity       Care Coordination Interventions: Reviewed medications with patient and discussed adherence  with no needed refills Reviewed scheduled/upcoming provider appointments including  sufficient transportation Assessed social determinant of health barriers Educated in care management services with no needs presented at this time.         Please call the care guide team at [phone] if you need to cancel or reschedule your appointment.   If you are experiencing a Mental Health or [place] or need someone to talk to, please call the Suicide and Crisis Lifeline: 988  Patient verbalizes understanding of instructions and care plan provided today and agrees to view in [place]. Active MyChart status and patient understanding of how to access instructions and care plan via MyChart confirmed with patient.     No further follow up required: No needs  Raina Mina, RN Care Management Coordinator [place] Office [phone]

## 2022-02-13 NOTE — Patient Outreach (Signed)
  Care Coordination   Initial Visit Note   02/13/2022 Name: ANTIONIO NEGRON MRN: [id number] DOB: 1940-08-22  Edrees J Prather is a 82 y.o. year old male who sees Jonathon Jordan, MD for primary care. I spoke with the pt's spouse Thayer Headings by phone today.  What matters to the patients health and wellness today?  No needs    Goals Addressed             This Visit's Progress    COMPLETED: care coordination activity       Care Coordination Interventions: Reviewed medications with patient and discussed adherence  with no needed refills Reviewed scheduled/upcoming provider appointments including  sufficient transportation Assessed social determinant of health barriers Educated in care management services with no needs presented at this time.         SDOH assessments and interventions completed:  Yes  SDOH Interventions Today    Flowsheet Row Most Recent Value  SDOH Interventions   Food Insecurity Interventions Intervention Not Indicated  Housing Interventions Intervention Not Indicated  Transportation Interventions Intervention Not Indicated  Utilities Interventions Intervention Not Indicated        Care Coordination Interventions:  Yes, provided   Follow up plan: No further intervention required.   Encounter Outcome:  Pt. Visit Completed   Raina Mina, RN Care Management Coordinator [place] Office [phone]

## 2022-02-15 DIAGNOSIS — L814 Other melanin hyperpigmentation: Secondary | ICD-10-CM | POA: Diagnosis not present

## 2022-02-15 DIAGNOSIS — D485 Neoplasm of uncertain behavior of skin: Secondary | ICD-10-CM | POA: Diagnosis not present

## 2022-02-15 DIAGNOSIS — L821 Other seborrheic keratosis: Secondary | ICD-10-CM | POA: Diagnosis not present

## 2022-02-15 DIAGNOSIS — L570 Actinic keratosis: Secondary | ICD-10-CM | POA: Diagnosis not present

## 2022-02-15 DIAGNOSIS — D225 Melanocytic nevi of trunk: Secondary | ICD-10-CM | POA: Diagnosis not present

## 2022-02-15 DIAGNOSIS — Z08 Encounter for follow-up examination after completed treatment for malignant neoplasm: Secondary | ICD-10-CM | POA: Diagnosis not present

## 2022-02-15 DIAGNOSIS — Z85820 Personal history of malignant melanoma of skin: Secondary | ICD-10-CM | POA: Diagnosis not present

## 2022-02-17 DIAGNOSIS — D485 Neoplasm of uncertain behavior of skin: Secondary | ICD-10-CM | POA: Diagnosis not present

## 2022-03-22 DIAGNOSIS — R2689 Other abnormalities of gait and mobility: Secondary | ICD-10-CM | POA: Diagnosis not present

## 2022-03-24 DIAGNOSIS — L905 Scar conditions and fibrosis of skin: Secondary | ICD-10-CM | POA: Diagnosis not present

## 2022-03-24 DIAGNOSIS — D0359 Melanoma in situ of other part of trunk: Secondary | ICD-10-CM | POA: Diagnosis not present

## 2022-03-29 DIAGNOSIS — H903 Sensorineural hearing loss, bilateral: Secondary | ICD-10-CM | POA: Diagnosis not present

## 2022-04-12 DIAGNOSIS — M5416 Radiculopathy, lumbar region: Secondary | ICD-10-CM | POA: Diagnosis not present

## 2022-04-12 DIAGNOSIS — M5440 Lumbago with sciatica, unspecified side: Secondary | ICD-10-CM | POA: Diagnosis not present

## 2022-04-13 ENCOUNTER — Other Ambulatory Visit: Payer: Self-pay | Admitting: Physical Medicine and Rehabilitation

## 2022-04-13 DIAGNOSIS — M5416 Radiculopathy, lumbar region: Secondary | ICD-10-CM

## 2022-05-10 ENCOUNTER — Ambulatory Visit
Admission: RE | Admit: 2022-05-10 | Discharge: 2022-05-10 | Disposition: A | Discharge status: Home / Self Care | Payer: Medicare HMO | Source: Ambulatory Visit | Attending: Physical Medicine and Rehabilitation | Admitting: Physical Medicine and Rehabilitation

## 2022-05-10 DIAGNOSIS — M47816 Spondylosis without myelopathy or radiculopathy, lumbar region: Secondary | ICD-10-CM | POA: Diagnosis not present

## 2022-05-10 DIAGNOSIS — M48061 Spinal stenosis, lumbar region without neurogenic claudication: Secondary | ICD-10-CM | POA: Diagnosis not present

## 2022-05-10 DIAGNOSIS — M545 Low back pain, unspecified: Secondary | ICD-10-CM | POA: Diagnosis not present

## 2022-05-10 DIAGNOSIS — M5416 Radiculopathy, lumbar region: Secondary | ICD-10-CM

## 2022-05-17 DIAGNOSIS — F324 Major depressive disorder, single episode, in partial remission: Secondary | ICD-10-CM | POA: Diagnosis not present

## 2022-05-17 DIAGNOSIS — I714 Abdominal aortic aneurysm, without rupture, unspecified: Secondary | ICD-10-CM | POA: Diagnosis not present

## 2022-05-17 DIAGNOSIS — M5126 Other intervertebral disc displacement, lumbar region: Secondary | ICD-10-CM | POA: Diagnosis not present

## 2022-05-25 DIAGNOSIS — Z79899 Other long term (current) drug therapy: Secondary | ICD-10-CM | POA: Diagnosis not present

## 2022-05-25 DIAGNOSIS — Z0000 Encounter for general adult medical examination without abnormal findings: Secondary | ICD-10-CM | POA: Diagnosis not present

## 2022-05-25 DIAGNOSIS — F324 Major depressive disorder, single episode, in partial remission: Secondary | ICD-10-CM | POA: Diagnosis not present

## 2022-05-25 DIAGNOSIS — E1169 Type 2 diabetes mellitus with other specified complication: Secondary | ICD-10-CM | POA: Diagnosis not present

## 2022-05-25 DIAGNOSIS — E119 Type 2 diabetes mellitus without complications: Secondary | ICD-10-CM | POA: Diagnosis not present

## 2022-05-25 DIAGNOSIS — E785 Hyperlipidemia, unspecified: Secondary | ICD-10-CM | POA: Diagnosis not present

## 2022-05-25 DIAGNOSIS — D692 Other nonthrombocytopenic purpura: Secondary | ICD-10-CM | POA: Diagnosis not present

## 2022-05-25 DIAGNOSIS — R5383 Other fatigue: Secondary | ICD-10-CM | POA: Diagnosis not present

## 2022-05-25 DIAGNOSIS — I714 Abdominal aortic aneurysm, without rupture, unspecified: Secondary | ICD-10-CM | POA: Diagnosis not present

## 2022-06-21 ENCOUNTER — Encounter: Payer: Self-pay | Admitting: Vascular Surgery

## 2022-06-21 ENCOUNTER — Ambulatory Visit: Payer: Medicare HMO | Admitting: Vascular Surgery

## 2022-06-21 VITALS — BP 150/87 | HR 61 | Temp 98.0°F | Resp 20 | Ht 72.0 in | Wt 252.9 lb

## 2022-06-21 DIAGNOSIS — I7143 Infrarenal abdominal aortic aneurysm, without rupture: Secondary | ICD-10-CM | POA: Diagnosis not present

## 2022-06-21 NOTE — Progress Notes (Signed)
Patient ID: ALIK Acevedo, male   DOB: 1940-07-12, 82 y.o.   MRN: [id number]  Reason for Consult: New Patient (Initial Visit)   Referred by Mila Palmer, MD  Subjective:     HPI:  Christopher Acevedo is a 82 y.o. male with known history of abdominal aortic aneurysm.  He recently had right ankle surgery has been slow to recover from that also had sciatica on the left and underwent MRI which demonstrated his already known aneurysm.  It is no new back or abdominal pain.  He does remain active except for his issues as noted above.  States that he has frequent constipation No complaints today.  Past Medical History:  Diagnosis Date   Actinic keratosis    DRY SKIN WITH LESIONS- MOSTLY ON FEET AND LOWER LEGS   Arthritis    Cancer (HCC)    nose basal cell carcinoma, neck melanoma   Chronic sinusitis    nasal polyp   Diverticulosis    LOWER ABD PAIN AND DIARRHEA - MOST RECENT EPISODE WAS 11/25/13- BETTER TODAY   Dry eyes    Elevated cholesterol    Environmental and seasonal allergies    GERD (gastroesophageal reflux disease)    Glaucoma    LEFT EYE   Gout    Hearing loss    BOTH EARS- HAS HEARING AIDS   Hypertension    Pre-diabetes    Sleep apnea    uses nightly   Spasm of eyelids    PT GET[facility] B[facility]   Family History  Problem Relation Age of Onset   Rheum arthritis Mother    Allergic rhinitis Neg Hx    Angioedema Neg Hx    Asthma Neg Hx    Atopy Neg Hx    Eczema Neg Hx    Immunodeficiency Neg Hx    Urticaria Neg Hx    Past Surgical History:  Procedure Laterality Date   BLEPHROPLASTY Bilateral    COLONOSCOPY W/ BIOPSIES AND POLYPECTOMY     CYST EXCISION Left 03/04/2020   Procedure: MUCOID CYST EXCISION WITH INTERPHALANGEAL JOINT DEBRIDEMENT;  Surgeon: Cindee Salt, MD;  Location: [facility];  Service: Orthopedics;  Laterality: Left;  FOREARM BLOCK   HERNIA REPAIR  1990s?   NASAL SINUS SURGERY      POLYPECTOMY N/A 03/10/2016   Procedure: POLYPECTOMY NASAL;  Surgeon: Osborn Coho, MD;  Location: [facility] OR;  Service: ENT;  Laterality: N/A;   RIGHT SHOULDER SURGERY     RIGHT THUMB AND INDEX FINGER SURGERY FOR ARTHRITIS     SINUS ENDO WITH FUSION Bilateral 03/10/2016   Procedure: BILATERAL REVISION SINUS ENDOSCOPIC WITH FUSION;  Surgeon: Osborn Coho, MD;  [facility];  Service: ENT;  Laterality: Bilateral;   TRANSANAL HEMORRHOIDAL DEARTERIALIZATION N/A 12/02/2013   Procedure: TRANSANAL HEMORRHOIDAL DEARTERIALIZATION AND LIGATION/PEXY, EXTERNAL HEMORRHOIDECTOMY, REMOVAL OF GLUTEAL MASS;  Surgeon: Karie Soda, MD;  Location: [facility];  Service: General;  Laterality: N/A;    Short Social History:  Social History   Tobacco Use   Smoking status: Former    Packs/day: 1.00    Years: 35.00    Additional pack years: 0.00    Total pack years: 35.00    Types: Cigarettes    Quit date: 01/24/2004    Years since quitting: 18.4   Smokeless tobacco: Former    Types: Snuff, Chew  Substance Use Topics   Alcohol use: Yes    Alcohol/week: 4.0  standard drinks of alcohol    Types: 4 Shots of liquor per week    Comment: 4 shots of liquor each week    Allergies  Allergen Reactions   Codeine Nausea And Vomiting    FELT FAINT    Current Outpatient Medications  Medication Sig Dispense Refill   allopurinol (ZYLOPRIM) 100 MG tablet Take 100 mg by mouth daily.     aspirin EC 81 MG tablet Take 81 mg by mouth daily. Swallow whole.     atorvastatin (LIPITOR) 40 MG tablet Take 40 mg by mouth daily at 6 PM. Takes 1/2 tablet daily     cholecalciferol (VITAMIN D) 1000 UNITS tablet Take 1,000 Units by mouth every morning. Reported on 07/08/2015     cycloSPORINE (RESTASIS) 0.05 % ophthalmic emulsion Place 1 drop into both eyes 2 (two) times daily.     MegaRed Omega-3 Krill Oil 500 MG CAPS Take by mouth.     meloxicam (MOBIC) 15 MG tablet Take 1 tablet by mouth daily.     traMADol (ULTRAM) 50 MG tablet Take  50 mg by mouth every 12 (twelve) hours as needed for moderate pain.      Turmeric 450 MG CAPS Take by mouth.     No current facility-administered medications for this visit.    Review of Systems  Constitutional:  Constitutional negative. HENT: HENT negative.  Eyes: Eyes negative.  Respiratory: Respiratory negative.  GI:       Constipation Musculoskeletal: Positive for leg pain.  Neurological: Neurological negative. Hematologic: Hematologic/lymphatic negative.  Psychiatric: Psychiatric negative.        Objective:  Objective   Vitals:   06/21/22 1055  BP: (!) 150/87  Pulse: 61  Resp: 20  Temp: 98 F (36.7 C)  SpO2: 95%     Physical Exam HENT:     Head: Normocephalic.     Nose: Nose normal.  Eyes:     Pupils: Pupils are equal, round, and reactive to light.  Neck:     Vascular: No carotid bruit.  Cardiovascular:     Rate and Rhythm: Normal rate.  Pulmonary:     Effort: Pulmonary effort is normal.     Breath sounds: Normal breath sounds.  Abdominal:     General: Abdomen is flat.     Palpations: Abdomen is soft. There is no mass.  Musculoskeletal:        General: Normal range of motion.     Cervical back: Normal range of motion and neck supple.     Right lower leg: No edema.     Left lower leg: No edema.  Skin:    General: Skin is warm.     Capillary Refill: Capillary refill takes less than 2 seconds.  Neurological:     General: No focal deficit present.     Mental Status: He is alert.  Psychiatric:        Mood and Affect: Mood normal.        Thought Content: Thought content normal.        Judgment: Judgment normal.     Data: MRI IMPRESSION: 1. At L5-S1 there is a mild broad-based disc bulge with a large central disc protrusion slightly eccentric towards the left with mass effect on the left intraspinal S1 nerve root. Moderate bilateral facet arthropathy. Mild spinal stenosis. Mild right foraminal stenosis. Mild left foraminal stenosis. 2.  No  acute osseous injury of the lumbar spine. 3. Otherwise, diffuse lumbar spine spondylosis as described above. 4. Abdominal  aortic aneurysm measuring 4.2 cm infrarenal. Recommend follow-up every 12 months and vascular consultation. Reference: J Am Coll Radiol 2013;10:789-794. Interval enlargement compared with 08/03/2020 at which time the aneurysm measures 3.8 cm.     Assessment/Plan:     82 year old male with 4.2 cm infrarenal aneurysm by recent MRI.  This has been documented going back several years and measured 3 cm by ultrasound in 2013 and later 3.6 cm that same year by CT 3.8 cm by a virtual colonoscopy 2 years ago.  As such appears to be very slow-growing and at this size would recommend duplex in 1 year for further evaluation.  We discussed the natural course being continued size increase the aneurysm was noted to be either 5 cm or have grown over 1/2 cm the next year we will plan for CT scan.  Sure that he will be followed yearly until he is greater than 4.5 cm at which time would switch to q 6 months follow-up.     Maeola Harman MD [facility]

## 2022-06-27 ENCOUNTER — Other Ambulatory Visit: Payer: Self-pay

## 2022-06-27 DIAGNOSIS — I7143 Infrarenal abdominal aortic aneurysm, without rupture: Secondary | ICD-10-CM

## 2022-06-28 DIAGNOSIS — M5451 Vertebrogenic low back pain: Secondary | ICD-10-CM | POA: Diagnosis not present

## 2022-06-28 DIAGNOSIS — M5416 Radiculopathy, lumbar region: Secondary | ICD-10-CM | POA: Diagnosis not present

## 2022-07-11 DIAGNOSIS — M5416 Radiculopathy, lumbar region: Secondary | ICD-10-CM | POA: Diagnosis not present

## 2022-07-14 DIAGNOSIS — D225 Melanocytic nevi of trunk: Secondary | ICD-10-CM | POA: Diagnosis not present

## 2022-07-14 DIAGNOSIS — Z1839 Other retained organic fragments: Secondary | ICD-10-CM | POA: Diagnosis not present

## 2022-07-14 DIAGNOSIS — Z85820 Personal history of malignant melanoma of skin: Secondary | ICD-10-CM | POA: Diagnosis not present

## 2022-07-14 DIAGNOSIS — L570 Actinic keratosis: Secondary | ICD-10-CM | POA: Diagnosis not present

## 2022-07-14 DIAGNOSIS — L821 Other seborrheic keratosis: Secondary | ICD-10-CM | POA: Diagnosis not present

## 2022-07-14 DIAGNOSIS — Z08 Encounter for follow-up examination after completed treatment for malignant neoplasm: Secondary | ICD-10-CM | POA: Diagnosis not present

## 2022-07-14 DIAGNOSIS — L814 Other melanin hyperpigmentation: Secondary | ICD-10-CM | POA: Diagnosis not present

## 2022-07-14 DIAGNOSIS — Z86006 Personal history of melanoma in-situ: Secondary | ICD-10-CM | POA: Diagnosis not present

## 2022-07-24 DIAGNOSIS — M5416 Radiculopathy, lumbar region: Secondary | ICD-10-CM | POA: Diagnosis not present

## 2022-07-24 DIAGNOSIS — R2689 Other abnormalities of gait and mobility: Secondary | ICD-10-CM | POA: Diagnosis not present

## 2022-07-26 DIAGNOSIS — M5416 Radiculopathy, lumbar region: Secondary | ICD-10-CM | POA: Diagnosis not present

## 2022-07-26 DIAGNOSIS — M5451 Vertebrogenic low back pain: Secondary | ICD-10-CM | POA: Diagnosis not present

## 2022-08-02 DIAGNOSIS — M5416 Radiculopathy, lumbar region: Secondary | ICD-10-CM | POA: Diagnosis not present

## 2022-08-09 DIAGNOSIS — R2689 Other abnormalities of gait and mobility: Secondary | ICD-10-CM | POA: Diagnosis not present

## 2022-08-09 DIAGNOSIS — M5416 Radiculopathy, lumbar region: Secondary | ICD-10-CM | POA: Diagnosis not present

## 2022-08-16 DIAGNOSIS — M5451 Vertebrogenic low back pain: Secondary | ICD-10-CM | POA: Diagnosis not present

## 2022-08-23 DIAGNOSIS — M5451 Vertebrogenic low back pain: Secondary | ICD-10-CM | POA: Diagnosis not present

## 2022-08-30 DIAGNOSIS — R2689 Other abnormalities of gait and mobility: Secondary | ICD-10-CM | POA: Diagnosis not present

## 2022-09-06 DIAGNOSIS — R2689 Other abnormalities of gait and mobility: Secondary | ICD-10-CM | POA: Diagnosis not present

## 2022-09-20 DIAGNOSIS — R2689 Other abnormalities of gait and mobility: Secondary | ICD-10-CM | POA: Diagnosis not present

## 2022-10-04 DIAGNOSIS — M5451 Vertebrogenic low back pain: Secondary | ICD-10-CM | POA: Diagnosis not present

## 2022-10-04 DIAGNOSIS — M5416 Radiculopathy, lumbar region: Secondary | ICD-10-CM | POA: Diagnosis not present

## 2022-10-17 DIAGNOSIS — L298 Other pruritus: Secondary | ICD-10-CM | POA: Diagnosis not present

## 2022-10-17 DIAGNOSIS — L821 Other seborrheic keratosis: Secondary | ICD-10-CM | POA: Diagnosis not present

## 2022-10-17 DIAGNOSIS — D225 Melanocytic nevi of trunk: Secondary | ICD-10-CM | POA: Diagnosis not present

## 2022-10-17 DIAGNOSIS — Z86006 Personal history of melanoma in-situ: Secondary | ICD-10-CM | POA: Diagnosis not present

## 2022-10-17 DIAGNOSIS — L820 Inflamed seborrheic keratosis: Secondary | ICD-10-CM | POA: Diagnosis not present

## 2022-10-17 DIAGNOSIS — L538 Other specified erythematous conditions: Secondary | ICD-10-CM | POA: Diagnosis not present

## 2022-10-17 DIAGNOSIS — L814 Other melanin hyperpigmentation: Secondary | ICD-10-CM | POA: Diagnosis not present

## 2022-10-17 DIAGNOSIS — Z08 Encounter for follow-up examination after completed treatment for malignant neoplasm: Secondary | ICD-10-CM | POA: Diagnosis not present

## 2022-10-17 DIAGNOSIS — L570 Actinic keratosis: Secondary | ICD-10-CM | POA: Diagnosis not present

## 2022-10-18 DIAGNOSIS — Z23 Encounter for immunization: Secondary | ICD-10-CM | POA: Diagnosis not present

## 2022-10-18 DIAGNOSIS — M7550 Bursitis of unspecified shoulder: Secondary | ICD-10-CM | POA: Diagnosis not present

## 2022-10-26 DIAGNOSIS — M5416 Radiculopathy, lumbar region: Secondary | ICD-10-CM | POA: Diagnosis not present

## 2022-11-17 IMAGING — CT CT VIRTUAL COLONOSCOPY DIAGNOSTIC
2 of 9 series · 12 of 46 positions shown, 14 images · non-contrast
Comparison: None.

CLINICAL DATA: History of colon polyps

EXAM:
CT VIRTUAL COLONOSCOPY DIAGNOSTIC
TECHNIQUE: The patient was given a standard bowel preparation with Gastrografin
and barium for fluid and stool tagging respectively. The quality of
the bowel preparation is poor. Automated CO2 insufflation of the
colon was performed prior to image acquisition and colonic
distention is poor. Image post processing was used to generate a 3D
endoluminal fly-through projection of the colon and to
electronically subtract stool/fluid as appropriate.

[Series 3: supine colon 1.50 br40 s3 supine thins · axial · 0.87mm/px · z∈[+1328,+1748]mm · 9 of 351 slices shown, 11 images]
[im 36/351  soft-tissue]
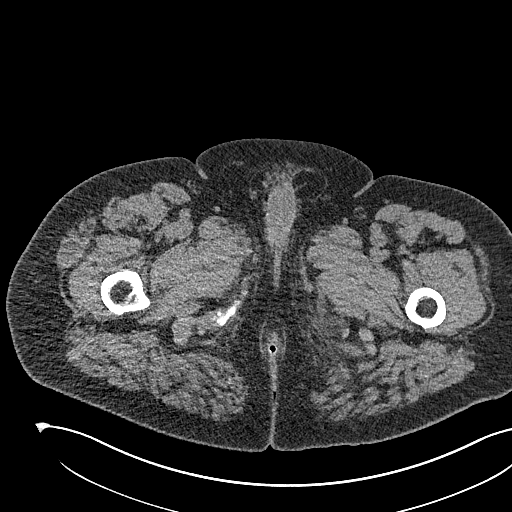
[im 36/351  bone]
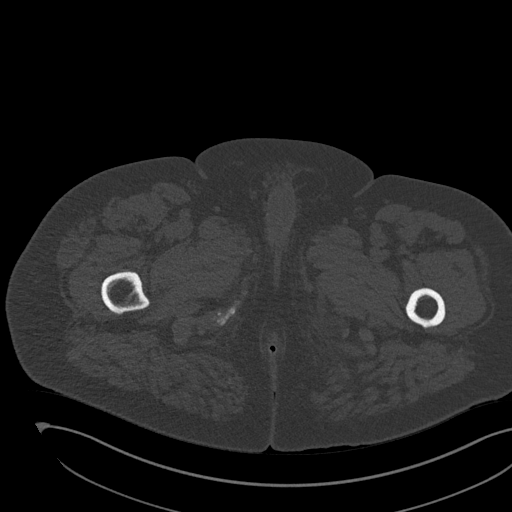
[im 71/351  soft-tissue]
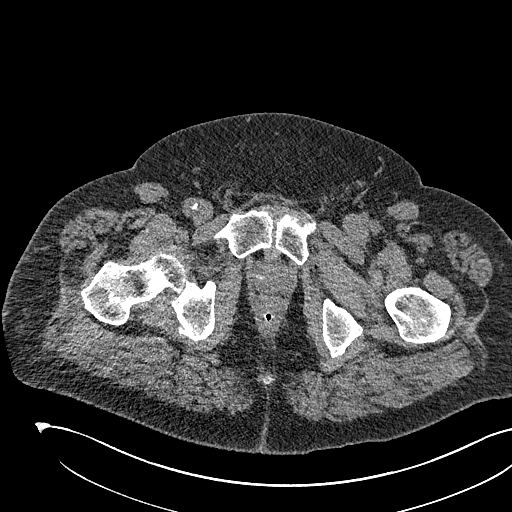
[im 106/351  soft-tissue]
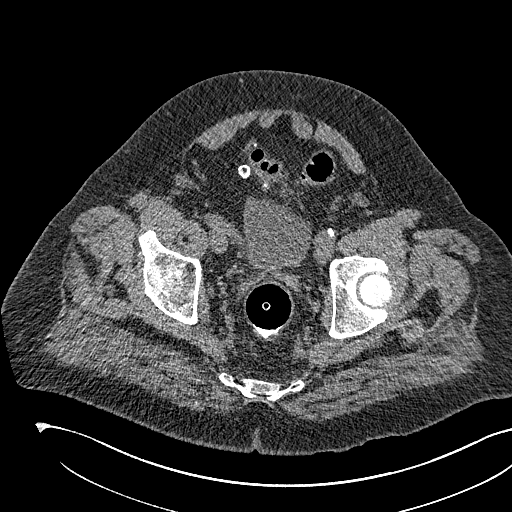
[im 141/351  soft-tissue]
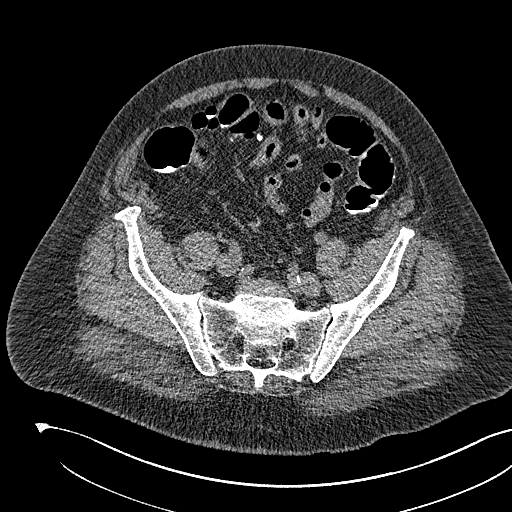
[im 176/351  soft-tissue]
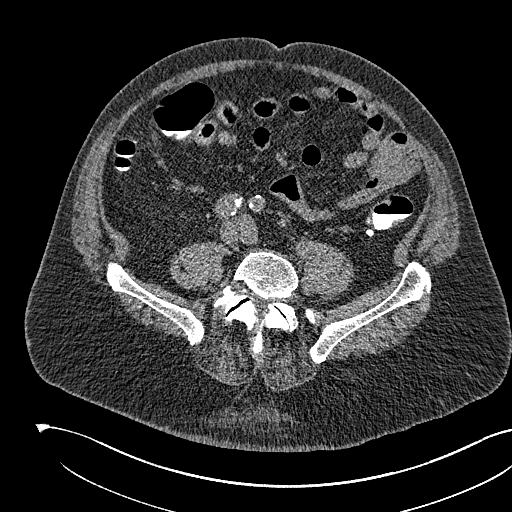
[im 211/351  soft-tissue]
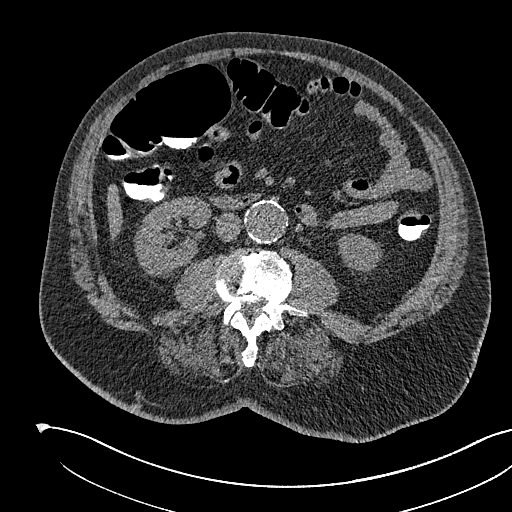
[im 246/351  soft-tissue]
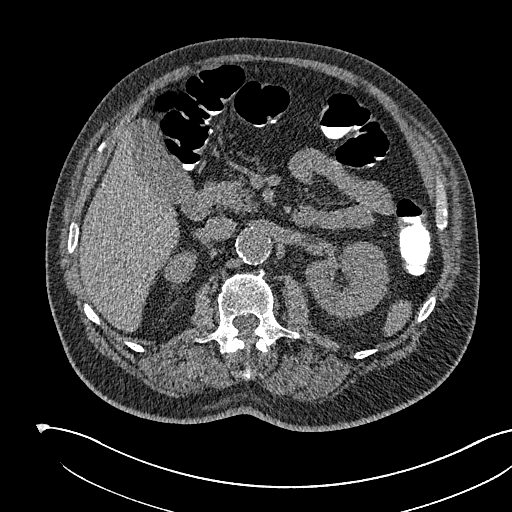
[im 281/351  soft-tissue]
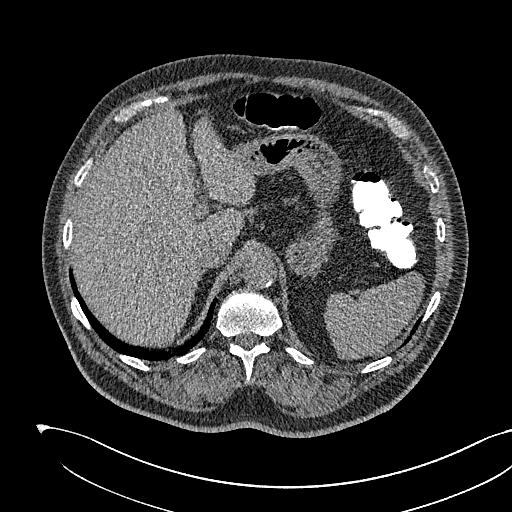
[im 316/351  soft-tissue]
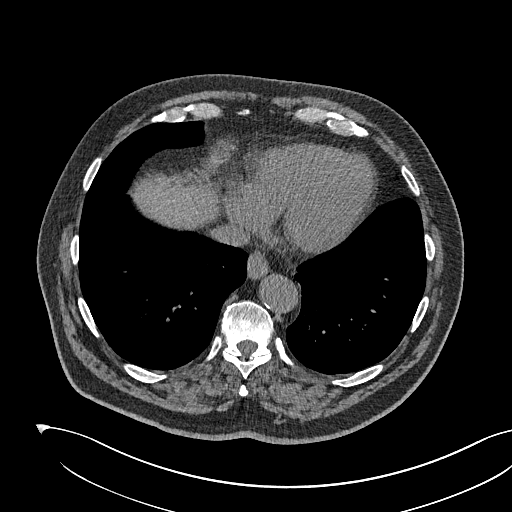
[im 316/351  bone]
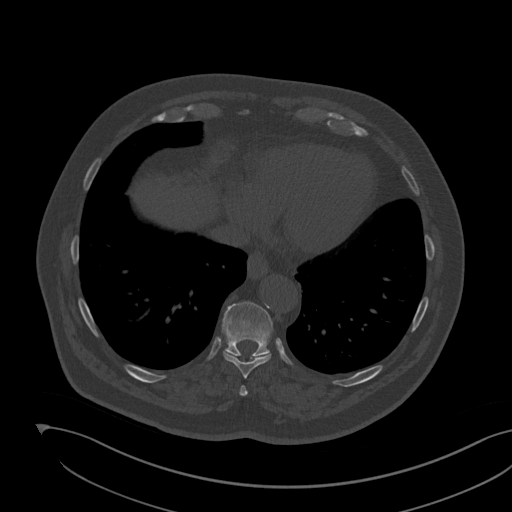

[Series 5: supine colon 3.00 br40 s3 cor supine · coronal · 0.87mm/px · 3 of 148 slices shown]
[im 30/148  soft-tissue]
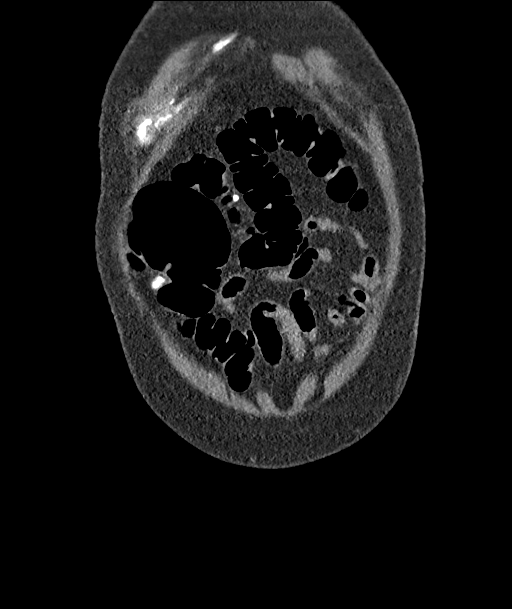
[im 59/148  soft-tissue]
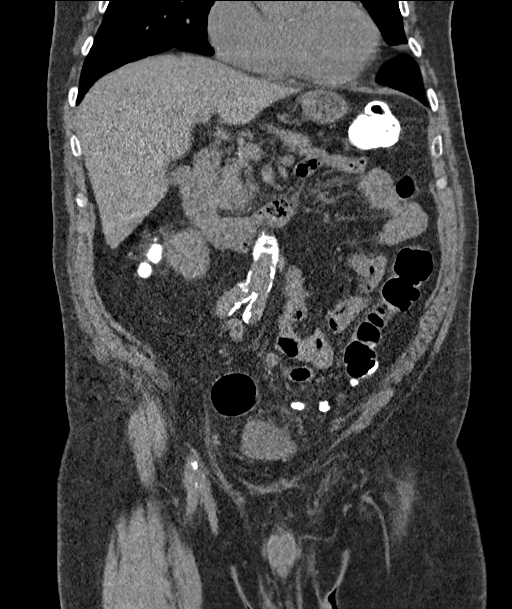
[im 89/148  soft-tissue]
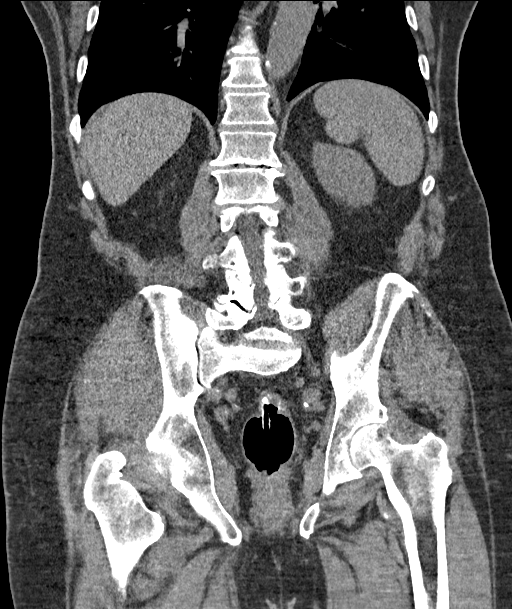

[12 of 46 positions shown; findings below may reference images not displayed]

FINDINGS: VIRTUAL COLONOSCOPY

There is a large amount of retained barium throughout the colon.
This includes full column barium opacification from the splenic
flexure to the mid descending colon on supine images and in the
transverse colon on prone images. Poor colonic distention throughout
much of the colon, particularly sigmoid colon and portions of the
right colon. Study is suboptimal to assess for polyps. No definite
visible large polypoid mass or annular constricting lesions.
Extensive diverticular disease throughout the colon most pronounced
in the sigmoid colon.

Virtual colonoscopy is not designed to detect diminutive polyps
(i.e., less than or equal to 5 mm), the presence or absence of which
may not affect clinical management.

CT ABDOMEN AND PELVIS WITHOUT CONTRAST

Lower chest: Lung bases are clear. No effusions. Heart is normal
size.

Hepatobiliary: No focal hepatic abnormality. Gallbladder
unremarkable.

Pancreas: No focal abnormality or ductal dilatation.

Spleen: No focal abnormality.  Normal size.

Adrenals/Urinary Tract: No adrenal abnormality. No focal renal
abnormality. No stones or hydronephrosis. Urinary bladder is
unremarkable.

Stomach/Bowel: Stomach and small bowel decompressed. Appendix
normal.

Vascular/Lymphatic: Aortic atherosclerosis. 3.8 cm infrarenal
abdominal aortic aneurysm. No adenopathy.

Reproductive: No visible focal abnormality.

Other: No free fluid or free air.

Musculoskeletal: No acute bony abnormality.
IMPRESSION: Suboptimal and nondiagnostic study due to under distention of
several parts of the colon as well as large amount of retained
barium including areas of full column opacification. No definite
large polypoid mass or annular constricting lesion seen. Diffuse
colonic diverticulosis most pronounced in the sigmoid colon.

Aortic atherosclerosis. 3.8 cm infrarenal abdominal aortic aneurysm.
Recommend follow-up ultrasound every 2 years. This recommendation
follows ACR consensus guidelines: White Paper of the ACR [employer]

## 2022-11-28 DIAGNOSIS — E1142 Type 2 diabetes mellitus with diabetic polyneuropathy: Secondary | ICD-10-CM | POA: Diagnosis not present

## 2022-11-28 DIAGNOSIS — K5900 Constipation, unspecified: Secondary | ICD-10-CM | POA: Diagnosis not present

## 2022-12-28 DIAGNOSIS — R0981 Nasal congestion: Secondary | ICD-10-CM | POA: Diagnosis not present

## 2022-12-28 DIAGNOSIS — J189 Pneumonia, unspecified organism: Secondary | ICD-10-CM | POA: Diagnosis not present

## 2022-12-28 DIAGNOSIS — R051 Acute cough: Secondary | ICD-10-CM | POA: Diagnosis not present

## 2022-12-28 DIAGNOSIS — R0609 Other forms of dyspnea: Secondary | ICD-10-CM | POA: Diagnosis not present

## 2022-12-28 DIAGNOSIS — Z03818 Encounter for observation for suspected exposure to other biological agents ruled out: Secondary | ICD-10-CM | POA: Diagnosis not present

## 2022-12-31 DIAGNOSIS — J189 Pneumonia, unspecified organism: Secondary | ICD-10-CM | POA: Diagnosis not present

## 2023-01-04 DIAGNOSIS — H9202 Otalgia, left ear: Secondary | ICD-10-CM | POA: Diagnosis not present

## 2023-01-04 DIAGNOSIS — R051 Acute cough: Secondary | ICD-10-CM | POA: Diagnosis not present

## 2023-01-12 DIAGNOSIS — U071 COVID-19: Secondary | ICD-10-CM | POA: Diagnosis not present

## 2023-01-12 DIAGNOSIS — Z20828 Contact with and (suspected) exposure to other viral communicable diseases: Secondary | ICD-10-CM | POA: Diagnosis not present

## 2023-02-14 DIAGNOSIS — H01001 Unspecified blepharitis right upper eyelid: Secondary | ICD-10-CM | POA: Diagnosis not present

## 2023-02-14 DIAGNOSIS — Z961 Presence of intraocular lens: Secondary | ICD-10-CM | POA: Diagnosis not present

## 2023-02-14 DIAGNOSIS — H01004 Unspecified blepharitis left upper eyelid: Secondary | ICD-10-CM | POA: Diagnosis not present

## 2023-02-14 DIAGNOSIS — H04123 Dry eye syndrome of bilateral lacrimal glands: Secondary | ICD-10-CM | POA: Diagnosis not present

## 2023-02-14 DIAGNOSIS — H40013 Open angle with borderline findings, low risk, bilateral: Secondary | ICD-10-CM | POA: Diagnosis not present

## 2023-04-17 DIAGNOSIS — Z85820 Personal history of malignant melanoma of skin: Secondary | ICD-10-CM | POA: Diagnosis not present

## 2023-04-17 DIAGNOSIS — Z08 Encounter for follow-up examination after completed treatment for malignant neoplasm: Secondary | ICD-10-CM | POA: Diagnosis not present

## 2023-04-17 DIAGNOSIS — L820 Inflamed seborrheic keratosis: Secondary | ICD-10-CM | POA: Diagnosis not present

## 2023-04-17 DIAGNOSIS — L538 Other specified erythematous conditions: Secondary | ICD-10-CM | POA: Diagnosis not present

## 2023-04-17 DIAGNOSIS — Z86006 Personal history of melanoma in-situ: Secondary | ICD-10-CM | POA: Diagnosis not present

## 2023-04-17 DIAGNOSIS — D225 Melanocytic nevi of trunk: Secondary | ICD-10-CM | POA: Diagnosis not present

## 2023-04-17 DIAGNOSIS — B353 Tinea pedis: Secondary | ICD-10-CM | POA: Diagnosis not present

## 2023-04-17 DIAGNOSIS — L821 Other seborrheic keratosis: Secondary | ICD-10-CM | POA: Diagnosis not present

## 2023-04-17 DIAGNOSIS — L814 Other melanin hyperpigmentation: Secondary | ICD-10-CM | POA: Diagnosis not present

## 2023-04-25 ENCOUNTER — Ambulatory Visit (HOSPITAL_COMMUNITY)
Admission: RE | Admit: 2023-04-25 | Discharge: 2023-04-25 | Disposition: A | Discharge status: Home / Self Care | Source: Ambulatory Visit | Attending: Vascular Surgery | Admitting: Vascular Surgery

## 2023-04-25 DIAGNOSIS — I7143 Infrarenal abdominal aortic aneurysm, without rupture: Secondary | ICD-10-CM | POA: Insufficient documentation

## 2023-05-02 ENCOUNTER — Encounter: Payer: Self-pay | Admitting: Vascular Surgery

## 2023-05-02 ENCOUNTER — Ambulatory Visit: Admitting: Vascular Surgery

## 2023-05-02 VITALS — BP 166/87 | HR 63 | Temp 97.8°F | Ht 72.0 in | Wt 252.0 lb

## 2023-05-02 DIAGNOSIS — I7143 Infrarenal abdominal aortic aneurysm, without rupture: Secondary | ICD-10-CM

## 2023-05-02 NOTE — Progress Notes (Signed)
 Patient ID: Christopher Acevedo, male   DOB: 11-Nov-1940, 83 y.o.   MRN: [id number]  Reason for Consult: Follow-up   Referred by Mila Palmer, MD  Subjective:     HPI:  Christopher Acevedo is a 83 y.o. male with known history of abdominal aortic aneurysm.  He now follows up with repeat duplex.  He does not have any new back or abdominal pain.  He is at his usual level of activity.  He has no complaints today.  Past Medical History:  Diagnosis Date   Actinic keratosis    DRY SKIN WITH LESIONS- MOSTLY ON FEET AND LOWER LEGS   Arthritis    Cancer (HCC)    nose basal cell carcinoma, neck melanoma   Chronic sinusitis    nasal polyp   Diverticulosis    LOWER ABD PAIN AND DIARRHEA - MOST RECENT EPISODE WAS 11/25/13- BETTER TODAY   Dry eyes    Elevated cholesterol    Environmental and seasonal allergies    GERD (gastroesophageal reflux disease)    Glaucoma    LEFT EYE   Gout    Hearing loss    BOTH EARS- HAS HEARING AIDS   Hypertension    Pre-diabetes    Sleep apnea    uses nightly   Spasm of eyelids    PT GETS BOTOX [facility]JE[facility]   Family History  Problem Relation Age of Onset   Rheum arthritis Mother    Allergic rhinitis Neg Hx    Angioedema Neg Hx    Asthma Neg Hx    Atopy Neg Hx    Eczema Neg Hx    Immunodeficiency Neg Hx    Urticaria Neg Hx    Past Surgical History:  Procedure Laterality Date   BLEPHROPLASTY Bilateral    COLONOSCOPY W/ BIOPSIES AND POLYPECTOMY     CYST EXCISION Left 03/04/2020   Procedure: MUCOID CYST EXCISION WITH INTERPHALANGEAL JOINT DEBRIDEMENT;  Surgeon: Cindee Salt, MD;  Location: [facility];  Service: Orthopedics;  Laterality: Left;  FOREARM BLOCK   HERNIA REPAIR  1990s?   NASAL SINUS SURGERY     POLYPECTOMY N/A 03/10/2016   Procedure: POLYPECTOMY NASAL;  Surgeon: Osborn Coho, MD;  Location: [facility] OR;  Service: ENT;  Laterality: N/A;   RIGHT SHOULDER SURGERY     RIGHT THUMB AND  INDEX FINGER SURGERY FOR ARTHRITIS     SINUS ENDO WITH FUSION Bilateral 03/10/2016   Procedure: BILATERAL REVISION SINUS ENDOSCOPIC WITH FUSION;  Surgeon: Osborn Coho, MD;  Location: [facility] OR;  Service: ENT;  Laterality: Bilateral;   TRANSANAL HEMORRHOIDAL DEARTERIALIZATION N/A 12/02/2013   Procedure: TRANSANAL HEMORRHOIDAL DEARTERIALIZATION AND LIGATION/PEXY, EXTERNAL HEMORRHOIDECTOMY, REMOVAL OF GLUTEAL MASS;  Surgeon: Karie Soda, MD;  Location: [facility];  Service: General;  Laterality: N/A;    Short Social History:  Social History   Tobacco Use   Smoking status: Former    Current packs/day: 0.00    Average packs/day: 1 pack/day for 35.0 years (35.0 ttl pk-yrs)    Types: Cigarettes    Start date: 01/23/1969    Quit date: 01/24/2004    Years since quitting: 19.2   Smokeless tobacco: Former    Types: Snuff, Chew  Substance Use Topics   Alcohol use: Yes    Alcohol/week: 4.0 standard drinks of alcohol    Types: 4 Shots of liquor per week    Comment: 4 shots of liquor each week    Allergies  Allergen Reactions   Codeine Nausea And Vomiting    FELT FAINT    Current Outpatient Medications  Medication Sig Dispense Refill   allopurinol (ZYLOPRIM) 100 MG tablet Take 100 mg by mouth daily.     aspirin EC 81 MG tablet Take 81 mg by mouth daily. Swallow whole.     atorvastatin (LIPITOR) 40 MG tablet Take 40 mg by mouth daily at 6 PM. Takes 1/2 tablet daily     cholecalciferol (VITAMIN D) 1000 UNITS tablet Take 1,000 Units by mouth every morning. Reported on 07/08/2015     cycloSPORINE (RESTASIS) 0.05 % ophthalmic emulsion Place 1 drop into both eyes 2 (two) times daily.     MegaRed Omega-3 Krill Oil 500 MG CAPS Take by mouth.     meloxicam (MOBIC) 15 MG tablet Take 1 tablet by mouth daily.     tamsulosin (FLOMAX) 0.4 MG CAPS capsule Take 0.8 mg by mouth daily.     traMADol (ULTRAM) 50 MG tablet Take 50 mg by mouth every 12 (twelve) hours as needed for moderate pain.      Turmeric 450 MG  CAPS Take by mouth.     No current facility-administered medications for this visit.    Review of Systems  Constitutional:  Constitutional negative. HENT: HENT negative.  Eyes: Eyes negative.  Respiratory: Respiratory negative.  Cardiovascular: Cardiovascular negative.  GI: Gastrointestinal negative.  Musculoskeletal: Musculoskeletal negative.  Skin:       Keratosis Neurological: Neurological negative.       Objective:  Objective   Vitals:   05/02/23 0919  BP: (!) 166/87  Pulse: 63  Temp: 97.8 F (36.6 C)  SpO2: 97%  Weight: 252 lb (114.3 kg)  Height: 6' (1.829 m)   Body mass index is 34.18 kg/m.  Physical Exam HENT:     Head: Normocephalic.     Nose: Nose normal.  Eyes:     Pupils: Pupils are equal, round, and reactive to light.  Cardiovascular:     Rate and Rhythm: Normal rate.     Pulses:          Posterior tibial pulses are 2+ on the right side and 2+ on the left side.  Pulmonary:     Effort: Pulmonary effort is normal.  Abdominal:     General: Abdomen is flat.     Palpations: Abdomen is soft. There is no mass.  Skin:    Capillary Refill: Capillary refill takes less than 2 seconds.     Comments: Stucco keratosis  Neurological:     Mental Status: He is alert.     Data: Abdominal Aorta Findings:  +-------------+-------+----------+----------+----------+  Location    AP (cm)Trans (cm)PSV (cm/s)Comments    +-------------+-------+----------+----------+----------+  Proximal    2.43   2.63      51                    +-------------+-------+----------+----------+----------+  Mid         4.34   4.30      40        AAA         +-------------+-------+----------+----------+----------+  Distal      2.41   2.41      35                    +-------------+-------+----------+----------+----------+  RT CIA Prox  1.2    1.2       142                   +-------------+-------+----------+----------+----------+  RT CIA Distal                  73                    +-------------+-------+----------+----------+----------+  RT EIA Prox                   55                    +-------------+-------+----------+----------+----------+  RT EIA Distal                 86                    +-------------+-------+----------+----------+----------+  LT CIA Prox  1.4    1.4       133                   +-------------+-------+----------+----------+----------+  LT CIA Mid   2.0    2.1                 Aneurysmal  +-------------+-------+----------+----------+----------+  LT CIA Distal                 101                   +-------------+-------+----------+----------+----------+  LT EIA Prox                   47                    +-------------+-------+----------+----------+----------+  LT EIA Distal                 76                    +-------------+-------+----------+----------+----------+    Summary:  Abdominal Aorta:  - There is evidence of abnormal dilatation of the mid Abdominal aorta with  a maximum diameter of 4.3 cm.  - There is evidence of abnormal dilation of the Left Common Iliac artery  with a maximum diameter of 2.1 cm.  - Patent aorta and bilateral iliac arteries with no evidence of stenosis.         Assessment/Plan:    83 year old male with history of noted abdominal aortic aneurysm now with stable 4.3 cm size.  This has been present for many years and is slow-growing.  Will see him back in 1 year with repeat duplex.  We discussed the signs and symptoms of rupture sure that we will repeat the duplex as above.     Maeola Harman MD [facility]

## 2023-06-15 DIAGNOSIS — I2510 Atherosclerotic heart disease of native coronary artery without angina pectoris: Secondary | ICD-10-CM | POA: Diagnosis not present

## 2023-06-15 DIAGNOSIS — I10 Essential (primary) hypertension: Secondary | ICD-10-CM | POA: Diagnosis not present

## 2023-06-15 DIAGNOSIS — E1169 Type 2 diabetes mellitus with other specified complication: Secondary | ICD-10-CM | POA: Diagnosis not present

## 2023-06-15 DIAGNOSIS — E1142 Type 2 diabetes mellitus with diabetic polyneuropathy: Secondary | ICD-10-CM | POA: Diagnosis not present

## 2023-06-23 DIAGNOSIS — F324 Major depressive disorder, single episode, in partial remission: Secondary | ICD-10-CM | POA: Diagnosis not present

## 2023-06-23 DIAGNOSIS — I2510 Atherosclerotic heart disease of native coronary artery without angina pectoris: Secondary | ICD-10-CM | POA: Diagnosis not present

## 2023-06-23 DIAGNOSIS — I10 Essential (primary) hypertension: Secondary | ICD-10-CM | POA: Diagnosis not present

## 2023-06-23 DIAGNOSIS — E1142 Type 2 diabetes mellitus with diabetic polyneuropathy: Secondary | ICD-10-CM | POA: Diagnosis not present

## 2023-06-23 DIAGNOSIS — E1169 Type 2 diabetes mellitus with other specified complication: Secondary | ICD-10-CM | POA: Diagnosis not present

## 2023-06-23 DIAGNOSIS — E785 Hyperlipidemia, unspecified: Secondary | ICD-10-CM | POA: Diagnosis not present

## 2023-07-04 DIAGNOSIS — R2689 Other abnormalities of gait and mobility: Secondary | ICD-10-CM | POA: Diagnosis not present

## 2023-07-04 DIAGNOSIS — N401 Enlarged prostate with lower urinary tract symptoms: Secondary | ICD-10-CM | POA: Diagnosis not present

## 2023-07-04 DIAGNOSIS — E785 Hyperlipidemia, unspecified: Secondary | ICD-10-CM | POA: Diagnosis not present

## 2023-07-04 DIAGNOSIS — I714 Abdominal aortic aneurysm, without rupture, unspecified: Secondary | ICD-10-CM | POA: Diagnosis not present

## 2023-07-04 DIAGNOSIS — E1169 Type 2 diabetes mellitus with other specified complication: Secondary | ICD-10-CM | POA: Diagnosis not present

## 2023-07-04 DIAGNOSIS — G245 Blepharospasm: Secondary | ICD-10-CM | POA: Diagnosis not present

## 2023-07-04 DIAGNOSIS — Z0000 Encounter for general adult medical examination without abnormal findings: Secondary | ICD-10-CM | POA: Diagnosis not present

## 2023-07-04 DIAGNOSIS — E1142 Type 2 diabetes mellitus with diabetic polyneuropathy: Secondary | ICD-10-CM | POA: Diagnosis not present

## 2023-07-04 DIAGNOSIS — Z79899 Other long term (current) drug therapy: Secondary | ICD-10-CM | POA: Diagnosis not present

## 2023-07-09 ENCOUNTER — Emergency Department (HOSPITAL_BASED_OUTPATIENT_CLINIC_OR_DEPARTMENT_OTHER): Admitting: Radiology

## 2023-07-09 ENCOUNTER — Emergency Department (HOSPITAL_BASED_OUTPATIENT_CLINIC_OR_DEPARTMENT_OTHER)
Admission: EM | Admit: 2023-07-09 | Discharge: 2023-07-09 | Disposition: A | Discharge status: Home / Self Care | Attending: Emergency Medicine | Admitting: Emergency Medicine

## 2023-07-09 ENCOUNTER — Encounter (HOSPITAL_BASED_OUTPATIENT_CLINIC_OR_DEPARTMENT_OTHER): Payer: Self-pay | Admitting: Emergency Medicine

## 2023-07-09 ENCOUNTER — Other Ambulatory Visit: Payer: Self-pay

## 2023-07-09 ENCOUNTER — Emergency Department (HOSPITAL_BASED_OUTPATIENT_CLINIC_OR_DEPARTMENT_OTHER)

## 2023-07-09 DIAGNOSIS — M25561 Pain in right knee: Secondary | ICD-10-CM | POA: Diagnosis not present

## 2023-07-09 DIAGNOSIS — Z7982 Long term (current) use of aspirin: Secondary | ICD-10-CM | POA: Diagnosis not present

## 2023-07-09 DIAGNOSIS — M25462 Effusion, left knee: Secondary | ICD-10-CM | POA: Insufficient documentation

## 2023-07-09 DIAGNOSIS — Z043 Encounter for examination and observation following other accident: Secondary | ICD-10-CM | POA: Diagnosis not present

## 2023-07-09 DIAGNOSIS — W19XXXA Unspecified fall, initial encounter: Secondary | ICD-10-CM | POA: Diagnosis not present

## 2023-07-09 DIAGNOSIS — S82142A Displaced bicondylar fracture of left tibia, initial encounter for closed fracture: Secondary | ICD-10-CM | POA: Insufficient documentation

## 2023-07-09 DIAGNOSIS — M25562 Pain in left knee: Secondary | ICD-10-CM | POA: Diagnosis present

## 2023-07-09 DIAGNOSIS — M1712 Unilateral primary osteoarthritis, left knee: Secondary | ICD-10-CM | POA: Diagnosis not present

## 2023-07-09 DIAGNOSIS — S80919A Unspecified superficial injury of unspecified knee, initial encounter: Secondary | ICD-10-CM | POA: Diagnosis not present

## 2023-07-09 DIAGNOSIS — M7122 Synovial cyst of popliteal space [Baker], left knee: Secondary | ICD-10-CM | POA: Diagnosis not present

## 2023-07-09 LAB — BASIC METABOLIC PANEL WITH GFR
Anion gap: 8 (ref 5–15)
BUN: 11 mg/dL (ref 8–23)
CO2: 29 mmol/L (ref 22–32)
Calcium: 9.4 mg/dL (ref 8.9–10.3)
Chloride: 102 mmol/L (ref 98–111)
Creatinine, Ser: 0.81 mg/dL (ref 0.61–1.24)
GFR, Estimated: >60 mL/min (ref >60)
Glucose, Bld: 108 mg/dL — ABNORMAL HIGH (ref 70–99)
Potassium: 4.4 mmol/L (ref 3.5–5.1)
Sodium: 138 mmol/L (ref 135–145)

## 2023-07-09 LAB — CBC WITH DIFFERENTIAL/PLATELET
Abs Immature Granulocytes: 0.02 10*3/uL (ref 0.00–0.07)
Basophils Absolute: 0.1 10*3/uL (ref 0.0–0.1)
Basophils Relative: 1 %
Eosinophils Absolute: 0.2 10*3/uL (ref 0.0–0.5)
Eosinophils Relative: 3 %
HCT: 37.7 % — ABNORMAL LOW (ref 39.0–52.0)
Hemoglobin: 13.2 g/dL (ref 13.0–17.0)
Immature Granulocytes: 0 %
Lymphocytes Relative: 20 %
Lymphs Abs: 1.6 10*3/uL (ref 0.7–4.0)
MCH: 32.7 pg (ref 26.0–34.0)
MCHC: 35 g/dL (ref 30.0–36.0)
MCV: 93.3 fL (ref 80.0–100.0)
Monocytes Absolute: 0.9 10*3/uL (ref 0.1–1.0)
Monocytes Relative: 11 %
Neutro Abs: 5.4 10*3/uL (ref 1.7–7.7)
Neutrophils Relative %: 65 %
Platelets: 165 10*3/uL (ref 150–400)
RBC: 4.04 MIL/uL — ABNORMAL LOW (ref 4.22–5.81)
RDW: 12.2 % (ref 11.5–15.5)
WBC: 8.2 10*3/uL (ref 4.0–10.5)
nRBC: 0 % (ref 0.0–0.2)

## 2023-07-09 MED ORDER — OXYCODONE HCL 5 MG PO TABS: 2.5000 mg | ORAL_TABLET | Freq: Four times a day (QID) | ORAL | 0 refills | Status: DC | PRN

## 2023-07-09 MED ORDER — OXYCODONE HCL 5 MG PO TABS
5.0000 mg | ORAL_TABLET | Freq: Once | ORAL | Status: AC
  Administered 2023-07-09: 5 mg via ORAL
  Filled 2023-07-09: qty 1

## 2023-07-09 NOTE — Discharge Planning (Signed)
 Wheelchair delivery canceled due to family providing one.  No additional RNCM needs identified.

## 2023-07-09 NOTE — ED Notes (Signed)
 Reviewed AVS/discharge instruction with patient. Time allotted for and all questions answered. Patient is agreeable for d/c and escorted to ed exit by staff.

## 2023-07-09 NOTE — Discharge Instructions (Signed)
 Please read and follow all provided instructions.  Your diagnoses today include:  1. Closed fracture of left tibial plateau, initial encounter   2. Effusion of left knee   3. Fall, initial encounter     Tests performed today include: An x-ray of the hip and knee were negative for fracture Follow-up CT showed tibial plateau fracture Vital signs. See below for your results today.   Medications prescribed:  Oxycodone  - narcotic pain medication  DO NOT drive or perform any activities that require you to be awake and alert because this medicine can make you drowsy.   Use pain medication only under direct supervision at the lowest possible dose needed to control your pain.   Take any prescribed medications only as directed.  Home care instructions:  Follow any educational materials contained in this packet Use knee immobilizer Follow R.I.C.E. Protocol: R - rest your injury  I  - use ice on injury without applying directly to skin C - compress injury with bandage or splint E - elevate the injury as much as possible  Follow-up instructions: Please follow-up with your orthopedist or orthopedic referral listed in 1 week.    Return instructions:  Please return if your toes or feet are numb or tingling, appear gray or blue, or you have severe pain (also elevate the leg and loosen splint or wrap if you were given one) Please return to the Emergency Department if you experience worsening symptoms.  Please return if you have any other emergent concerns.  Additional Information:  Your vital signs today were: BP 121/69   Pulse 65   Temp 98.7 F (37.1 C)   Resp 19   Ht 6' (1.829 m)   Wt 114.3 kg   SpO2 97%   BMI 34.18 kg/m  If your blood pressure (BP) was elevated above 135/85 this visit, please have this repeated by your doctor within one month. --------------

## 2023-07-09 NOTE — ED Triage Notes (Signed)
 See ems report; pt fell, hit left knee; increased pain today. No head injury or LOC. NAD noted.

## 2023-07-09 NOTE — Discharge Planning (Signed)
 Christopher Acevedo, BSN, RN, [place] [phone] Pt qualifies for DME (Durable Medical Equipment) wheelchair.  DME  ordered through [employer].  Marlou Sims of [employer] notified to deliver DME to pt room prior to D/C home.

## 2023-07-09 NOTE — ED Triage Notes (Signed)
 EMS report  From home; mechanical fall last night (tangled in cords of CPAP); landed on L knee. Awakened with swelling and pain, inability to bear weight. Denies LOC or head injury. Took tylenol  last night. Has taken daily meds today. PMS intact.

## 2023-07-09 NOTE — ED Provider Notes (Signed)
 [place] [facility] AT [facility] Provider Note   CSN: [id number] Arrival date & time: 07/09/23  4098     Patient presents with: Knee Pain   Christopher Acevedo is a 83 y.o. male.   Patient with history of AAA currently undergoing yearly monitoring, no anticoagulation, has sleep apnea --presents to the emergency department today for evaluation of left knee injury.  Patient fell at around 7 PM last evening.  Family thinks that he may have gotten tangled in the CPAP tubing and fell onto a tile floor.  He landed on his left knee.  He had a lot of pain from onset.  He was able to help get himself up.  He slept overnight after taking Tylenol .  Family recommended that he come to the emergency department immediately, but he wanted to rest.  This morning he could not bear weight.  The knee was more swollen.  He has pain in the knee into the back of the calf.  No hip pain.  He denies hitting his head or hurting his neck.  Patient lives at home with his wife who has significant health problems.  He is here with his daughter today.       Prior to Admission medications   Medication Sig Start Date End Date Taking? Authorizing Provider  allopurinol (ZYLOPRIM) 100 MG tablet Take 100 mg by mouth daily.    [provider]  aspirin EC 81 MG tablet Take 81 mg by mouth daily. Swallow whole.    [provider]  atorvastatin (LIPITOR) 40 MG tablet Take 40 mg by mouth daily at 6 PM. Takes 1/2 tablet daily    [provider]  cholecalciferol (VITAMIN D) 1000 UNITS tablet Take 1,000 Units by mouth every morning. Reported on 07/08/2015    [provider]  cycloSPORINE (RESTASIS) 0.05 % ophthalmic emulsion Place 1 drop into both eyes 2 (two) times daily.    [provider]  MegaRed Omega-3 Krill Oil 500 MG CAPS Take by mouth.    [provider]  meloxicam (MOBIC) 15 MG tablet Take 1 tablet by mouth daily.    [provider]   tamsulosin (FLOMAX) 0.4 MG CAPS capsule Take 0.8 mg by mouth daily.    [provider]  traMADol  (ULTRAM ) 50 MG tablet Take 50 mg by mouth every 12 (twelve) hours as needed for moderate pain.     [provider]  Turmeric 450 MG CAPS Take by mouth.    [provider]    Allergies: Codeine    Review of Systems  Updated Vital Signs BP (!) 140/109 (BP Location: Right Arm)   Pulse 68   Temp 98.7 F (37.1 C)   Ht 6' (1.829 m)   Wt 114.3 kg   SpO2 97%   BMI 34.18 kg/m   Physical Exam Vitals and nursing note reviewed.  Constitutional:      Appearance: He is well-developed.  HENT:     Head: Normocephalic and atraumatic.   Eyes:     Conjunctiva/sclera: Conjunctivae normal.   Pulmonary:     Effort: No respiratory distress.   Musculoskeletal:     Cervical back: Normal range of motion and neck supple. No tenderness. Normal range of motion.     Left hip: No tenderness.     Left upper leg: No swelling or edema.     Left knee: Effusion present. Decreased range of motion. Tenderness present.     Left ankle: No tenderness. Normal range  of motion.       Legs:   Skin:    General: Skin is warm and dry.   Neurological:     Mental Status: He is alert.    ED Course  Patient seen and examined. History obtained directly from patient and daughter who is at bedside.  Labs/EKG: Ordered CBC and BMP.  Imaging: Ordered x-ray of the left knee and left hip.  Medications/Fluids: Ordered: P.o. oxycodone , will consider IV medications when established if necessary.   Most recent vital signs reviewed and are as follows: BP (!) 140/109 (BP Location: Right Arm)   Pulse 68   Temp 98.7 F (37.1 C)   Resp 19   Ht 6' (1.829 m)   Wt 114.3 kg   SpO2 97%   BMI 34.18 kg/m   Initial impression: Inability to ambulate, mechanical fall, left knee effusion and pain.  12:16 PM Reassessment performed. Patient appears stable.  After initial x-rays were negative, CT was  ordered to evaluate for occult fracture given high clinical suspicion.  Labs personally reviewed and interpreted including: CBC unremarkable; BMP glucose 108 otherwise unremarkable.  Imaging personally visualized and interpreted including: X-ray of the knee and hip, agree negative for obvious fracture; CT scan of the knee demonstrates tibial plateau fracture.  Reviewed pertinent lab work and imaging with patient and family at bedside.   Family is very supportive.  They will help care for the patient upon returning home.  We did discuss mobility, patient has a regular walker at home.  I discussed with TOC, obtaining wheelchair.  Plans were made for this, however additional family arrived and patient is found to have access to a wheelchair and does not want wheelchair that was ordered.  I discussed case with Dr. Maryjane Snider with orthopedic surgery.  He recommends nonweightbearing in the immobilizer, follow-up in office in 1 week.  Most current vital signs reviewed and are as follows: BP 121/69   Pulse 65   Temp 98.7 F (37.1 C)   Resp 19   Ht 6' (1.829 m)   Wt 114.3 kg   SpO2 97%   BMI 34.18 kg/m   Plan: Discharge to home.   Prescriptions written for: Oxycodone  # 8 tablets  Use pain medication only under direct supervision at the lowest possible dose needed to control your pain.   Other home care instructions discussed:RICE protocol, discussed need for use of knee immobilizer  ED return instructions discussed: Return with new or worsening symptoms  Follow-up instructions discussed: Patient encouraged to follow-up with their orthopedist or orthopedic referral in 1 week.   (all labs ordered are listed, but only abnormal results are displayed) Labs Reviewed  CBC WITH DIFFERENTIAL/PLATELET - Abnormal; Notable for the following components:      Result Value   RBC 4.04 (*)    HCT 37.7 (*)    All other components within normal limits  BASIC METABOLIC PANEL WITH GFR - Abnormal; Notable  for the following components:   Glucose, Bld 108 (*)    All other components within normal limits    EKG: None  Radiology: CT Knee Left Wo Contrast Result Date: 07/09/2023 CLINICAL DATA:  Knee pain and swelling after fall. EXAM: CT OF THE LEFT KNEE WITHOUT CONTRAST TECHNIQUE: Multidetector CT imaging of the left knee was performed according to the standard protocol. Multiplanar CT image reconstructions were also generated. RADIATION DOSE REDUCTION: This exam was performed according to the departmental dose-optimization program which includes automated exposure control, adjustment of the mA  and/or kV according to patient size and/or use of iterative reconstruction technique. COMPARISON:  Left knee radiographs dated 08/08/2023. FINDINGS: Bones/Joint/Cartilage Acute comminuted transverse oblique fracture of the lateral tibial plateau with extension through the base of the tibial spines at the level of the posterior intercondylar surface of the tibial plateau (series 6, images 53-61; and series 7, images 39-50). There is approximately 3-4 mm of central depression at the level of the posterior intercondylar surface of the tibial plateau (series 6, images 64) and minimal approximately 1 mm of articular surface step-off at the mid lateral tibial plateau. Small to moderate volume lipohemarthrosis. The remainder of the visualized bones appear intact. No evidence of dislocation. Medial compartment predominant joint space narrowing and osteophytosis with punctate focus of mineralization along the lateral aspect of the medial femorotibial compartment, which could represent a tiny intra-articular body or possible chondrocalcinosis. Punctate foci of mineralization adjacent to the medial femoral epicondyle, at the level of the proximal medial collateral ligament, could reflect sequela of age-indeterminate ligamentous injury. Ligaments Ligaments are suboptimally evaluated by CT. Muscles and Tendons Muscles are within  normal limits. Small Baker's cyst. No intramuscular fluid collection or hematoma. Soft tissue Soft tissue swelling along the medial, anterior, and lateral knee. No discrete loculated fluid collection. IMPRESSION: 1. Acute comminuted transverse oblique fracture of the lateral tibial plateau extending through the base of the tibial spines at the level of the posterior intercondylar surface of the tibial plateau with mild central depression. These findings are most compatible with a Schatzker type II/III tibial plateau fracture. 2. Small to moderate volume lipohemarthrosis. Electronically Signed   By: Mannie Seek M.D.   On: 07/09/2023 11:18   DG Knee Complete 4 Views Left Result Date: 07/09/2023 CLINICAL DATA:  Post fall EXAM: LEFT KNEE - COMPLETE 4+ VIEW COMPARISON:  None Available. FINDINGS: No evidence of fracture, dislocation, or joint effusion. No evidence of arthropathy or other focal bone abnormality. Soft tissues are unremarkable. IMPRESSION: Osteoarthrosis with narrowing of the medial compartment and calcifications of the menisci consistent with chondrocalcinosis Electronically Signed   By: Fredrich Jefferson M.D.   On: 07/09/2023 10:29   DG Hip Unilat W or Wo Pelvis 2-3 Views Left Result Date: 07/09/2023 CLINICAL DATA:  Post fall EXAM: DG HIP (WITH OR WITHOUT PELVIS) 2-3V LEFT COMPARISON:  None Available. FINDINGS: There is no evidence of hip fracture or dislocation. There is no evidence of arthropathy or other focal bone abnormality. IMPRESSION: No acute fractures Electronically Signed   By: Fredrich Jefferson M.D.   On: 07/09/2023 10:28     Procedures   Medications Ordered in the ED  oxyCODONE  (Oxy IR/ROXICODONE ) immediate release tablet 5 mg (has no administration in time range)                                    Medical Decision Making Amount and/or Complexity of Data Reviewed Labs: ordered. Radiology: ordered.  Risk Prescription drug management.   Patient with mechanical fall at  home onto the left knee.  Patient found to have a tibial plateau fracture on CT imaging.  Hip negative on x-ray and patient without significant pain to suspect occult fracture there.  No lower back pain.  Patient did not hit his head or neck.  He is at his normal baseline mental status.  Basic labs normal.  Orthopedic follow-up planned in 1 week.  Family is very supportive.  Home with small  amount of pain medication to use for severe pain.      Final diagnoses:  Closed fracture of left tibial plateau, initial encounter  Effusion of left knee  Fall, initial encounter    ED Discharge Orders          Ordered    For home use only DME standard manual wheelchair with seat cushion  Status:  Canceled       Comments: Patient suffers from left tibial plateau fracture which impairs their ability to perform daily activities like bathing, dressing, feeding, grooming, and toileting in the home.  A cane, crutch, or walker will not resolve issue with performing activities of daily living. A wheelchair will allow patient to safely perform daily activities. Patient can safely propel the wheelchair in the home or has a caregiver who can provide assistance. Length of need 6 months . Accessories: elevating leg rests (ELRs), wheel locks, extensions and anti-tippers.   07/09/23 1151    oxyCODONE  (OXY IR/ROXICODONE ) 5 MG immediate release tablet  Every 6 hours PRN        07/09/23 1215               Lyna Sandhoff, PA-C 07/09/23 1220    Scarlette Currier, MD 07/10/23 [phone]

## 2023-07-10 DIAGNOSIS — S82142A Displaced bicondylar fracture of left tibia, initial encounter for closed fracture: Secondary | ICD-10-CM | POA: Diagnosis not present

## 2023-07-16 DIAGNOSIS — M25561 Pain in right knee: Secondary | ICD-10-CM | POA: Diagnosis not present

## 2023-07-16 DIAGNOSIS — S82141S Displaced bicondylar fracture of right tibia, sequela: Secondary | ICD-10-CM | POA: Diagnosis not present

## 2023-07-16 NOTE — Progress Notes (Signed)
 Patient Name: Christopher Acevedo MR#: [id number]   Subjective:  HPI (07/16/2023):  History of Present Illness The patient presents for evaluation of knee pain.  He reports experiencing severe knee pain when the joint is positioned in a certain way. The injury occurred on Sunday night when he tripped over a cord while carrying his CPAP machine to the sunroom, causing him to fall onto his knee. He is uncertain whether he hit the ledge or the concrete floor. Initially, he was unable to rise from a seated position, but his mobility has since improved. He has been sleeping in a recliner due to difficulty elevating his leg in bed. He has been using a walker for support and has taken four doses of oxycodone  for pain management.  He has been experiencing balance issues for approximately 1.5 years and has sought assistance from a specialized facility.  He also mentions a history of gout in his right ankle, which was treated by a podiatrist. However, the condition worsened, leading to a torn tendon and ligament that required surgical intervention. He used a knee walker for 3 months post-surgery.  Additionally, he has a herniated disc and sciatic nerve issues, which persist to this day.  PAST SURGICAL HISTORY: Reconstructed tendon and ligament in the right ankle.  SOCIAL HISTORY Marital Status: Married Occupations: Facilities manager Exercise: Plays golf two or three times a week   BMI Readings from Last 1 Encounters:  02/18/21 33.91 kg/m   Objective:   Physical Exam:  Right Knee: Skin intact, no lesions No effusion ROM 10-70 with minimal pain SILT SP/DP/T + motor DF/PF/EHL Palpable DP pulse   Diagnostic Studies:   X-rays of the right knee reviewed and interpreted, demonstrating lipohemarthrosis.  No displaced fractures  CT of the right knee reviewed interpreted demonstrating posterior tibial plateau nondisplaced fracture  Assessment:  83 y.o. male with right posterior tibial plateau  nondisplaced fracture  Medical Decision Making:  Unfortunately he just lost his wife last night and he is having trouble getting around and remaining nonweightbearing.  We discussed that a very limited amount of the weightbearing portion of the tibia is involved in his fracture is probably okay with him to start weightbearing slightly using a walker to try and get around the house.  He is going to do that and we will see him back in 3 weeks for repeat radiographs to ensure no displacement of his fracture.

## 2023-08-06 DIAGNOSIS — S82141D Displaced bicondylar fracture of right tibia, subsequent encounter for closed fracture with routine healing: Secondary | ICD-10-CM | POA: Diagnosis not present

## 2023-08-06 DIAGNOSIS — M25461 Effusion, right knee: Secondary | ICD-10-CM | POA: Diagnosis not present

## 2023-08-06 DIAGNOSIS — S82141S Displaced bicondylar fracture of right tibia, sequela: Secondary | ICD-10-CM | POA: Diagnosis not present

## 2023-08-06 NOTE — Progress Notes (Signed)
 Patient Name: Christopher Acevedo MR#: [id number]   Subjective:  Interval History (08/06/2023): [facility] returns for reevaluation of nondisplaced right tibial plateau fracture.  He has been ambulating with a walker and occasionally without the walker in house.  The soreness is improving but still occasionally bothersome.  HPI (07/16/2023):  History of Present Illness The patient presents for evaluation of knee pain.  He reports experiencing severe knee pain when the joint is positioned in a certain way. The injury occurred on Sunday night when he tripped over a cord while carrying his CPAP machine to the sunroom, causing him to fall onto his knee. He is uncertain whether he hit the ledge or the concrete floor. Initially, he was unable to rise from a seated position, but his mobility has since improved. He has been sleeping in a recliner due to difficulty elevating his leg in bed. He has been using a walker for support and has taken four doses of oxycodone  for pain management.  He has been experiencing balance issues for approximately 1.5 years and has sought assistance from a specialized facility.  He also mentions a history of gout in his right ankle, which was treated by a podiatrist. However, the condition worsened, leading to a torn tendon and ligament that required surgical intervention. He used a knee walker for 3 months post-surgery.  Additionally, he has a herniated disc and sciatic nerve issues, which persist to this day.  PAST SURGICAL HISTORY: Reconstructed tendon and ligament in the right ankle.  SOCIAL HISTORY Marital Status: Married Occupations: Facilities manager Exercise: Plays golf two or three times a week   BMI Readings from Last 1 Encounters:  02/18/21 33.91 kg/m   Objective:   Physical Exam:  Right Knee: Skin intact, no lesions No effusion ROM 10-70 with minimal pain SILT SP/DP/T + motor DF/PF/EHL Palpable DP pulse   Diagnostic Studies:   X-rays of the right knee  reviewed and interpreted, demonstrating lipohemarthrosis.  No displaced fractures  CT of the right knee reviewed interpreted demonstrating posterior tibial plateau nondisplaced fracture  X-rays from 08/06/2023 demonstrate no further displacement of any fracture fragments.  Assessment:  83 y.o. male with right posterior tibial plateau nondisplaced fracture  Medical Decision Making:  He has been able to weight-bear and advance his activity.  He has had no displacement of fracture on his radiographs today.  He may continue to return to activity as tolerated and call us  if any issues in the coming month or 2 before he reaches a full recovery

## 2023-08-23 DIAGNOSIS — E1142 Type 2 diabetes mellitus with diabetic polyneuropathy: Secondary | ICD-10-CM | POA: Diagnosis not present

## 2023-08-23 DIAGNOSIS — F324 Major depressive disorder, single episode, in partial remission: Secondary | ICD-10-CM | POA: Diagnosis not present

## 2023-08-23 DIAGNOSIS — I10 Essential (primary) hypertension: Secondary | ICD-10-CM | POA: Diagnosis not present

## 2023-08-23 DIAGNOSIS — E1169 Type 2 diabetes mellitus with other specified complication: Secondary | ICD-10-CM | POA: Diagnosis not present

## 2023-08-23 DIAGNOSIS — I2510 Atherosclerotic heart disease of native coronary artery without angina pectoris: Secondary | ICD-10-CM | POA: Diagnosis not present

## 2023-08-23 DIAGNOSIS — E785 Hyperlipidemia, unspecified: Secondary | ICD-10-CM | POA: Diagnosis not present

## 2023-08-28 NOTE — Therapy (Signed)
 OUTPATIENT PHYSICAL THERAPY NEURO EVALUATION   Patient Name: Christopher Acevedo MRN: [id number] DOB:1940-10-21, 83 y.o., male Today's Date: 08/29/2023   PCP: Verena Mems, MD REFERRING PROVIDER: Verena Mems, MD  END OF SESSION:  PT End of Session - 08/29/23 1320     Visit Number 1    Number of Visits 13    Date for PT Re-Evaluation 10/10/23    Authorization Type [employer]    Authorization Time Period auth submitted    PT Start Time 1229    PT Stop Time 1313    PT Time Calculation (min) 44 min    Activity Tolerance Patient tolerated treatment well    Behavior During Therapy WFL for tasks assessed/performed          Past Medical History:  Diagnosis Date   Actinic keratosis    DRY SKIN WITH LESIONS- MOSTLY ON FEET AND LOWER LEGS   Arthritis    Cancer (HCC)    nose basal cell carcinoma, neck melanoma   Chronic sinusitis    nasal polyp   Diverticulosis    LOWER ABD PAIN AND DIARRHEA - MOST RECENT EPISODE WAS 11/25/13- BETTER TODAY   Dry eyes    Elevated cholesterol    Environmental and seasonal allergies    GERD (gastroesophageal reflux disease)    Glaucoma    LEFT EYE   Gout    Hearing loss    BOTH EARS- HAS HEARING AIDS   Hypertension    Pre-diabetes    Sleep apnea    uses nightly   Spasm of eyelids    PT [facility]   Past Surgical History:  Procedure Laterality Date   BLEPHROPLASTY Bilateral    COLONOSCOPY W/ BIOPSIES AND POLYPECTOMY     CYST EXCISION Left 03/04/2020   Procedure: MUCOID CYST EXCISION WITH INTERPHALANGEAL JOINT DEBRIDEMENT;  Surgeon: Murrell Kuba, MD;  Location: [facility];  Service: Orthopedics;  Laterality: Left;  FOREARM BLOCK   HERNIA REPAIR  1990s?   NASAL SINUS SURGERY     POLYPECTOMY N/A 03/10/2016   Procedure: POLYPECTOMY NASAL;  Surgeon: Alm Bouche, MD;  Location: [facility] OR;  Service: ENT;  Laterality: N/A;   RIGHT SHOULDER SURGERY     RIGHT THUMB  AND INDEX FINGER SURGERY FOR ARTHRITIS     SINUS ENDO WITH FUSION Bilateral 03/10/2016   Procedure: BILATERAL REVISION SINUS ENDOSCOPIC WITH FUSION;  Surgeon: Alm Bouche, MD;  Location: [facility] OR;  Service: ENT;  Laterality: Bilateral;   TRANSANAL HEMORRHOIDAL DEARTERIALIZATION N/A 12/02/2013   Procedure: TRANSANAL HEMORRHOIDAL DEARTERIALIZATION AND LIGATION/PEXY, EXTERNAL HEMORRHOIDECTOMY, REMOVAL OF GLUTEAL MASS;  Surgeon: Elspeth Schultze, MD;  Location: [facility] ORS;  Service: General;  Laterality: N/A;   Patient Active Problem List   Diagnosis Date Noted   AAA (abdominal aortic aneurysm) without rupture (HCC) 07/10/2015   Arthropathia 07/10/2015   Blepharospasm 07/10/2015   Arteriosclerosis of coronary artery 07/10/2015   Hemorrhoids without complication 07/10/2015   Essential (primary) hypertension 07/10/2015   Agnogenic myeloid metaplasia (HCC) 07/10/2015   Apnea, sleep 07/10/2015   Fatty infiltration of liver 07/10/2015   Nasal polyposis 02/16/2015   Perennial and seasonal allergic rhinitis 02/16/2015    ONSET DATE: July 08, 2023  REFERRING DIAG: R26.89 (ICD-10-CM) - Other abnormalities of gait and mobility  THERAPY DIAG:  Acute pain of left knee  Stiffness of left knee, not elsewhere classified  Other abnormalities of gait and mobility  Unsteadiness on feet  Rationale for Evaluation and Treatment: Rehabilitation  SUBJECTIVE:                                                                                                                                                                                             SUBJECTIVE STATEMENT: Patient reports that his tibia hurts like the dickens. Reports that it hurts when he moves a certain way, such as when sitting on a low commode or throwing the leg up into bed. Pain is located over the L medial knee and the back of the calf. Reports that before his fall and fx, he had a herniated disc that caused numbness in the L LE. Reports that  since the fall, he was using a 4WW but has been trying to get off of it recently. Reports that his wife recently passed from a heart attack in June 2025.    Pt accompanied by: self  PERTINENT HISTORY: HLD, gout, HTN, B hearing loss, R shoulder surgery; per pt- reports R ankle surgery   PAIN:  Are you having pain? Yes: NPRS scale: no pain at rest Pain location: L medial tibia, L calf  Pain description: dull Aggravating factors: sitting on a low commode, throwing the leg up into bed  Relieving factors: repositioning   PRECAUTIONS: Fall Per Sports Medicine MD note on 08/06/23 he has been able to weight-bear and advance his activity.  He has had no displacement of fracture on his radiographs today.  He may continue to return to activity as tolerated and call us  if any issues in the coming month or 2 before he reaches a full recovery  RED FLAGS: None   WEIGHT BEARING RESTRICTIONS: No  FALLS: Has patient fallen in last 6 months? Yes. Number of falls 1  LIVING ENVIRONMENT: Lives with: lives alone; wife recently deceased but daughters come by to assist with chores  Lives in: House/apartment Stairs: ramp entry; 1 step from sunroom, 1 step into den Has following equipment at home: Single point cane, Environmental consultant - 2 wheeled, Environmental consultant - 4 wheeled, and Grab bars  PLOF: Independent and Vocation/Vocational requirements: pt enjoys golf  PATIENT GOALS: improve balance and pain   OBJECTIVE:  Note: Objective measures were completed at Evaluation unless otherwise noted.  DIAGNOSTIC FINDINGS: 08/06/23 R knee xray:   No acute displaced fracture or traumatic malalignment. Prior CT demonstrating a tibial plateau fracture is not available for comparison.  2.  Small joint effusion.  3.  Mild tricompartmental degenerative changes.   COGNITION: Overall cognitive status: Within functional limits for tasks assessed   SENSATION: Pt reports N/T in the L posterior calf  POSTURE: rounded shoulders and  forward head  PALPATION: no TTP over knee, slightly sore over L calf and muscle restriction noted. No redness, warmth, and no calf swelling but mild edema evident over L medial knee  LOWER EXTREMITY ROM:     Active  Right Eval Left Eval  Hip flexion    Hip extension    Hip abduction    Hip adduction    Hip internal rotation    Hip external rotation    Knee flexion  105 *pain  Knee extension  9 deg *pain  Ankle dorsiflexion  10  Ankle plantarflexion    Ankle inversion    Ankle eversion     (Blank rows = not tested)  LOWER EXTREMITY MMT:    MMT *in sitting Right Eval Left Eval  Hip flexion 5 5  Hip extension    Hip abduction 5 5  Hip adduction 4+ 4+  Hip internal rotation    Hip external rotation    Knee flexion 5 4  Knee extension 5 4  Ankle dorsiflexion 4+ 4  Ankle plantarflexion 4+ 4+  Ankle inversion    Ankle eversion    (Blank rows = not tested)   GAIT: Findings: Assistive device utilized:None, Level of assistance: Modified independence, and Comments: antalgic on L LE and R hip drop   FUNCTIONAL TESTS:  5 times sit to stand: 13.68 sec  10 meter walk test: 12.02 sec without AD (2.73 ft/sec)                                                                                                                                TREATMENT DATE: 08/29/23    PATIENT EDUCATION: Education details: edu on use of ice 10 min at a time for swelling, prognosis, POC, HEP Person educated: Patient Education method: Explanation, Demonstration, Tactile cues, Verbal cues, and Handouts Education comprehension: verbalized understanding  HOME EXERCISE PROGRAM: Access Code: [email or web address] URL: [email or web address] Date: 08/29/2023 Prepared by: [facility] - Outpatient  Rehab - [facility]  Program Notes do not push into pain!  Exercises - Standing Gastroc Stretch at Counter  - 1 x daily - 5 x weekly - 2 sets - 10 reps - 30 sec  hold - Supine Heel Slide with Strap   - 1 x daily - 5 x weekly - 2 sets - 10 reps - Supine Active Straight Leg Raise  - 1 x daily - 5 x weekly - 2 sets - 10 reps - Sit to Stand Without Arm Support  - 1 x daily - 5 x weekly - 2 sets - 10 reps   GOALS: Goals reviewed with patient? Yes  SHORT TERM GOALS: Target date: 09/19/2023  Patient to be independent with initial HEP. Baseline: HEP initiated Goal status: INITIAL    LONG TERM GOALS: Target date: 10/10/2023  Patient to be independent with advanced HEP. Baseline: Not yet initiated  Goal status: INITIAL  Patient to demonstrate  B LE strength >/=4+/5.  Baseline: See above Goal status: INITIAL  Patient to demonstrate L knee AROM WFL and without pain limiting.  Baseline: 9-105 deg and painful  Goal status: INITIAL  Patient to demonstrate symmetric step length and stance time with ambulation with LRAD. Baseline: analgic on L LE Goal status: INITIAL  Patient to score at least 23/30 on FGA in order to decrease risk of falls.  Baseline: NT Goal status: INITIAL  Patient to report tolerance for 30-60 minutes of weightbearing activity without L knee pain limiting.  Baseline: reports reduced activity tolerance  Goal status: INITIAL   ASSESSMENT:  CLINICAL IMPRESSION:  Patient is an 83 y/o M presenting to OPPT with c/o L knee pain and imbalance. Of note, patient sustained a L lateral tibial plateau fx see on CT 07/09/23 after a fall. Patient today presenting with L calf muscle tightness, limited L ankle and knee ROM with pain, L knee and ankle weakness, gait deviations.Patient was educated on gentle ROM and stretching HEP and reported understanding. Prior to current episode, patient was independent. Would benefit from skilled PT services 1-2 x/week for 6 weeks to address aforementioned impairments in order to optimize level of function.    OBJECTIVE IMPAIRMENTS: Abnormal gait, decreased activity tolerance, decreased balance, decreased ROM, decreased strength, impaired  flexibility, postural dysfunction, and pain.   ACTIVITY LIMITATIONS: carrying, lifting, bending, standing, squatting, stairs, transfers, bathing, toileting, dressing, reach over head, hygiene/grooming, and locomotion level  PARTICIPATION LIMITATIONS: meal prep, cleaning, laundry, shopping, community activity, yard work, and church  PERSONAL FACTORS: Age, Past/current experiences, Time since onset of injury/illness/exacerbation, and 3+ comorbidities: HLD, gout, HTN, B hearing loss, R shoulder surgery are also affecting patient's functional outcome.   REHAB POTENTIAL: Good  CLINICAL DECISION MAKING: Evolving/moderate complexity  EVALUATION COMPLEXITY: Moderate  PLAN:  PT FREQUENCY: 1-2x/week  PT DURATION: 6 weeks  PLANNED INTERVENTIONS: 97164- PT Re-evaluation, 97110-Therapeutic exercises, 97530- Therapeutic activity, [id number]- Neuromuscular re-education, 97535- Self Care, [id number]- Manual therapy, [id number]- Gait training, [phone]- Canalith repositioning, [id number]- Aquatic Therapy, [phone] (1-2 muscles), [id number] (3+ muscles)- Dry Needling, Patient/Family education, Balance training, Stair training, Taping, Joint mobilization, Spinal mobilization, Vestibular training, DME instructions, Cryotherapy, and Moist heat  PLAN FOR NEXT SESSION: review HEP and progress for L LE strength. FGA, work on increasing L WBAT to improve timing of gait, assess stairs  Referring diagnosis? R26.89 (ICD-10-CM) - Other abnormalities of gait and mobility Treatment diagnosis? (if different than referring diagnosis)  Acute pain of left knee  Stiffness of left knee, not elsewhere classified  Other abnormalities of gait and mobility  Unsteadiness on feet What was this (referring dx) caused by? []  Surgery [x]  Fall []  Ongoing issue []  Arthritis []  Other: ____________  Laterality: []  Rt [x]  Lt []  Both  Check all possible CPT codes:  *CHOOSE 10 OR LESS*    See Planned Interventions listed in the Plan section of the  Evaluation.     Louana Terrilyn Christians, PT, DPT 08/29/23 1:33 PM  [facility] [address] [place], [address] [place], [place] [address] Phone # [phone] Fax # [phone]

## 2023-08-29 ENCOUNTER — Ambulatory Visit: Attending: Family Medicine | Admitting: Physical Therapy

## 2023-08-29 ENCOUNTER — Encounter: Payer: Self-pay | Admitting: Physical Therapy

## 2023-08-29 ENCOUNTER — Other Ambulatory Visit: Payer: Self-pay

## 2023-08-29 DIAGNOSIS — M25562 Pain in left knee: Secondary | ICD-10-CM | POA: Insufficient documentation

## 2023-08-29 DIAGNOSIS — R2681 Unsteadiness on feet: Secondary | ICD-10-CM | POA: Diagnosis not present

## 2023-08-29 DIAGNOSIS — R2689 Other abnormalities of gait and mobility: Secondary | ICD-10-CM | POA: Diagnosis not present

## 2023-08-29 DIAGNOSIS — M25662 Stiffness of left knee, not elsewhere classified: Secondary | ICD-10-CM | POA: Insufficient documentation

## 2023-09-03 ENCOUNTER — Ambulatory Visit: Admitting: Physical Therapy

## 2023-09-03 DIAGNOSIS — I959 Hypotension, unspecified: Secondary | ICD-10-CM | POA: Diagnosis not present

## 2023-09-03 DIAGNOSIS — R55 Syncope and collapse: Secondary | ICD-10-CM | POA: Diagnosis not present

## 2023-09-04 ENCOUNTER — Other Ambulatory Visit: Payer: Self-pay

## 2023-09-04 ENCOUNTER — Emergency Department (HOSPITAL_BASED_OUTPATIENT_CLINIC_OR_DEPARTMENT_OTHER)
Admission: EM | Admit: 2023-09-04 | Discharge: 2023-09-04 | Disposition: A | Discharge status: Home / Self Care | Attending: Emergency Medicine | Admitting: Emergency Medicine

## 2023-09-04 ENCOUNTER — Encounter (HOSPITAL_BASED_OUTPATIENT_CLINIC_OR_DEPARTMENT_OTHER): Payer: Self-pay

## 2023-09-04 ENCOUNTER — Ambulatory Visit: Admitting: Physical Therapy

## 2023-09-04 ENCOUNTER — Emergency Department (HOSPITAL_BASED_OUTPATIENT_CLINIC_OR_DEPARTMENT_OTHER)

## 2023-09-04 DIAGNOSIS — I959 Hypotension, unspecified: Secondary | ICD-10-CM

## 2023-09-04 DIAGNOSIS — Z7982 Long term (current) use of aspirin: Secondary | ICD-10-CM | POA: Insufficient documentation

## 2023-09-04 DIAGNOSIS — K449 Diaphragmatic hernia without obstruction or gangrene: Secondary | ICD-10-CM | POA: Diagnosis not present

## 2023-09-04 DIAGNOSIS — R031 Nonspecific low blood-pressure reading: Secondary | ICD-10-CM | POA: Diagnosis not present

## 2023-09-04 DIAGNOSIS — R42 Dizziness and giddiness: Secondary | ICD-10-CM | POA: Diagnosis not present

## 2023-09-04 DIAGNOSIS — R109 Unspecified abdominal pain: Secondary | ICD-10-CM | POA: Diagnosis not present

## 2023-09-04 DIAGNOSIS — K5730 Diverticulosis of large intestine without perforation or abscess without bleeding: Secondary | ICD-10-CM | POA: Diagnosis not present

## 2023-09-04 DIAGNOSIS — R5383 Other fatigue: Secondary | ICD-10-CM | POA: Diagnosis not present

## 2023-09-04 DIAGNOSIS — I7143 Infrarenal abdominal aortic aneurysm, without rupture: Secondary | ICD-10-CM | POA: Diagnosis not present

## 2023-09-04 DIAGNOSIS — I10 Essential (primary) hypertension: Secondary | ICD-10-CM | POA: Diagnosis not present

## 2023-09-04 LAB — COMPREHENSIVE METABOLIC PANEL WITH GFR
ALT: 15 U/L (ref 0–44)
AST: 21 U/L (ref 15–41)
Albumin: 3.9 g/dL (ref 3.5–5.0)
Alkaline Phosphatase: 71 U/L (ref 38–126)
Anion gap: 9 (ref 5–15)
BUN: 15 mg/dL (ref 8–23)
CO2: 28 mmol/L (ref 22–32)
Calcium: 9.8 mg/dL (ref 8.9–10.3)
Chloride: 101 mmol/L (ref 98–111)
Creatinine, Ser: 0.87 mg/dL (ref 0.61–1.24)
GFR, Estimated: >60 mL/min (ref >60)
Glucose, Bld: 91 mg/dL (ref 70–99)
Potassium: 4 mmol/L (ref 3.5–5.1)
Sodium: 138 mmol/L (ref 135–145)
Total Bilirubin: 0.6 mg/dL (ref 0.0–1.2)
Total Protein: 6.2 g/dL — ABNORMAL LOW (ref 6.5–8.1)

## 2023-09-04 LAB — CBC
HCT: 36.8 % — ABNORMAL LOW (ref 39.0–52.0)
Hemoglobin: 12.6 g/dL — ABNORMAL LOW (ref 13.0–17.0)
MCH: 32.5 pg (ref 26.0–34.0)
MCHC: 34.2 g/dL (ref 30.0–36.0)
MCV: 94.8 fL (ref 80.0–100.0)
Platelets: 198 K/uL (ref 150–400)
RBC: 3.88 MIL/uL — ABNORMAL LOW (ref 4.22–5.81)
RDW: 12.1 % (ref 11.5–15.5)
WBC: 6.2 K/uL (ref 4.0–10.5)
nRBC: 0 % (ref 0.0–0.2)

## 2023-09-04 LAB — CBG MONITORING, ED: Glucose-Capillary: 96 mg/dL (ref 70–99)

## 2023-09-04 MED ORDER — IOHEXOL 300 MG/ML  SOLN
100.0000 mL | Freq: Once | INTRAMUSCULAR | Status: AC | PRN
  Administered 2023-09-04 (×2): 100 mL via INTRAVENOUS

## 2023-09-04 NOTE — Discharge Instructions (Addendum)
 As we discussed, please stop your Flomax.  This could be contributing to low blood pressure.  Please take your blood pressure 2-3 times daily after stopping the Flomax.  If your blood pressure improves and no have low blood pressure then likely this medication is contributing. Please follow  up with your primary care physician in the next week.   Come back to the ED if you have persistent low blood pressure    4.4 cm infrarenal abdominal aortic aneurysm. Recommend follow-up every 12 months and vascular consultation   Please follow up with vascular surgery so they can monitor this aneurysm.

## 2023-09-04 NOTE — ED Provider Notes (Signed)
 [place] EMERGENCY DEPARTMENT AT [facility] Provider Note   CSN: [id number] Arrival date & time: 09/04/23  1301     Patient presents with: Abdominal Pain, Hypotension, and Dizziness   Christopher Acevedo is a 83 y.o. male.    Abdominal Pain Dizziness    Patient has a history of hypertension arthritis diverticulosis acid reflux glaucoma elevated cholesterol, cancer, gout.  Patient states he had noticed over the weekend that his blood pressure was low.  It was in the 80s and 90s.  Patient had been noticing that he was feeling somewhat lightheaded fatigue.  Patient states he went to his doctor's office and they had him go to the [place] walk-in clinic.  They suggested he come to the ED yesterday to be evaluated further.  Patient did not want to come yesterday so he decided to come today.  He is not been having any chest pain he is not have any shortness of breath.  He does have an abdominal aortic aneurysm that has been there for many years but he is not having any pain or discomfort.  He has not noticed any blood in his stool.  Prior to Admission medications   Medication Sig Start Date End Date Taking? Authorizing Provider  aspirin EC 81 MG tablet Take 81 mg by mouth daily. Swallow whole.    [provider]  atorvastatin (LIPITOR) 40 MG tablet Take 40 mg by mouth daily at 6 PM. Takes 1/2 tablet daily    [provider]  cholecalciferol (VITAMIN D) 1000 UNITS tablet Take 1,000 Units by mouth every morning. Reported on 07/08/2015    [provider]  cycloSPORINE (RESTASIS) 0.05 % ophthalmic emulsion Place 1 drop into both eyes 2 (two) times daily.    [provider]  MegaRed Omega-3 Krill Oil 500 MG CAPS Take by mouth.    [provider]  oxyCODONE  (OXY IR/ROXICODONE ) 5 MG immediate release tablet Take 0.5-1 tablets (2.5-5 mg total) by mouth every 6 (six) hours as needed for severe pain (pain score 7-10). Patient not taking: Reported on  08/29/2023 07/09/23   Geiple, Joshua, PA-C  tamsulosin (FLOMAX) 0.4 MG CAPS capsule Take 0.8 mg by mouth daily.    [provider]  Turmeric 450 MG CAPS Take by mouth.    [provider]    Allergies: Codeine    Review of Systems  Gastrointestinal:  Positive for abdominal pain.  Neurological:  Positive for dizziness.    Updated Vital Signs BP (!) 125/109   Pulse (!) 58   Temp 97.6 F (36.4 C)   Resp (!) 23   SpO2 95%   Physical Exam Vitals and nursing note reviewed.  Constitutional:      General: He is not in acute distress.    Appearance: He is well-developed.  HENT:     Head: Normocephalic and atraumatic.     Right Ear: External ear normal.     Left Ear: External ear normal.  Eyes:     General: No scleral icterus.       Right eye: No discharge.        Left eye: No discharge.     Conjunctiva/sclera: Conjunctivae normal.  Neck:     Trachea: No tracheal deviation.  Cardiovascular:     Rate and Rhythm: Normal rate and regular rhythm.  Pulmonary:     Effort: Pulmonary effort is normal. No respiratory distress.     Breath sounds: Normal breath sounds. No stridor. No wheezing or rales.  Abdominal:     General: Bowel sounds are normal. There is no distension.     Palpations: Abdomen is soft.     Tenderness: There is no abdominal tenderness. There is no guarding or rebound.  Musculoskeletal:        General: No tenderness or deformity.     Cervical back: Neck supple.  Skin:    General: Skin is warm and dry.     Findings: No rash.  Neurological:     General: No focal deficit present.     Mental Status: He is alert.     Cranial Nerves: No cranial nerve deficit, dysarthria or facial asymmetry.     Sensory: No sensory deficit.     Motor: No abnormal muscle tone or seizure activity.     Coordination: Coordination normal.  Psychiatric:        Mood and Affect: Mood normal.     (all labs ordered are listed, but only abnormal results are displayed) Labs  Reviewed  COMPREHENSIVE METABOLIC PANEL WITH GFR - Abnormal; Notable for the following components:      Result Value   Total Protein 6.2 (*)    All other components within normal limits  CBC - Abnormal; Notable for the following components:   RBC 3.88 (*)    Hemoglobin 12.6 (*)    HCT 36.8 (*)    All other components within normal limits  URINALYSIS, ROUTINE W REFLEX MICROSCOPIC  CBG MONITORING, ED    EKG: EKG Interpretation Date/Time:  Tuesday September 04 2023 13:13:55 EDT Ventricular Rate:  72 PR Interval:  194 QRS Duration:  164 QT Interval:  438 QTC Calculation: 479 R Axis:   -44  Text Interpretation: Normal sinus rhythm Left axis deviation Right bundle branch block Abnormal ECG When compared with ECG of 01-Mar-2020 11:49, No significant change since last tracing Confirmed by Randol Simmonds [phone]) on 09/04/2023 1:18:28 PM  Radiology: No results found.   Procedures   Medications Ordered in the ED  iohexol  (OMNIPAQUE ) 300 MG/ML solution 100 mL (100 mLs Intravenous Contrast Given 09/04/23 1525)                                    Medical Decision Making Amount and/or Complexity of Data Reviewed Labs: ordered. Decision-making details documented in ED Course. Radiology: ordered.  Risk Prescription drug management.   Patient presented to the ED for evaluation of episodes of low blood pressure.  Patient was seen his primary care doctor and they suggested he come to the ED for further evaluation. Patient denies any trouble with abdominal pain.  He has not had any fevers or chills.  He has not noticed any blood in his stool. Patient's initial labs show stable hemoglobin.  No signs of dehydration.  Initial blood pressure was low although without any intervention his blood pressure has normalized. It is possible symptoms could be orthostatic in nature.  He is on Flomax. However with his history of abdominal aortic aneurysm we will proceed with CT imaging to rule out any acute  changes. Care turned over to Dr Simon at shift hcange.     Final diagnoses:  None    ED Discharge Orders     None          Randol Simmonds, MD 09/04/23 1538

## 2023-09-04 NOTE — ED Provider Notes (Signed)
  Physical Exam  BP (!) 125/109   Pulse (!) 58   Temp 97.6 F (36.4 C)   Resp (!) 23   SpO2 95%   Physical Exam Vitals and nursing note reviewed.  Constitutional:      General: He is not in acute distress.    Appearance: He is well-developed.  HENT:     Head: Normocephalic and atraumatic.  Eyes:     Conjunctiva/sclera: Conjunctivae normal.  Cardiovascular:     Rate and Rhythm: Normal rate and regular rhythm.     Heart sounds: No murmur heard. Pulmonary:     Effort: Pulmonary effort is normal. No respiratory distress.     Breath sounds: Normal breath sounds.  Abdominal:     Palpations: Abdomen is soft.     Tenderness: There is no abdominal tenderness.  Musculoskeletal:        General: No swelling.     Cervical back: Neck supple.  Skin:    General: Skin is warm and dry.     Capillary Refill: Capillary refill takes less than 2 seconds.  Neurological:     Mental Status: He is alert.  Psychiatric:        Mood and Affect: Mood normal.     Procedures  Procedures  ED Course / MDM    Medical Decision Making Amount and/or Complexity of Data Reviewed Labs: ordered. Radiology: ordered.  Risk Prescription drug management.    Received patient in signout.  Patient presents due to blood pressure being slightly low over the weekend.  Systolics in the 80s and 90s.  Somewhat lightheaded and fatigue recently but otherwise no syncopal episodes.  Labs obtained.  Largely unremarkable.  No large electrolyte derangements.  Pending CT scan abdomen given history of AAA.  No abdominal pain.   Cardiac telemetry interpreted by me: Normal sinus rhythm.  Episodes of slight sinus bradycardia with heart rate around 58 but otherwise unremarkable.  No arrhythmia.  Has remained appropriate.   Reviewed CT scan.  4.4 cm aneurysm.  He can follow-up with vascular surgery.  States that this is consistent for his previous imaging.   NIH 0 at time of discharge.  Cranials 2 through 12  intact.   He is taking  Flomax.  Counseled patient stop taking Flomax and see if this could be contributing to his episodes of hypotension.  Patient will take his blood pressure 2-3 times daily.  He will follow-up with PCP.      Christopher Lavonia SAILOR, MD 09/04/23 [phone]

## 2023-09-04 NOTE — ED Triage Notes (Signed)
 Patient reports abdominal pain and hypotension. He states he has a AAA of 4.5 cm. He noted since Saturday he has had low blood pressure when he took it after being lightheaded. He also reports his wife recently died and he hasn't been feeling well since.  He is slightly hypotensive in triage. 87/77 (82)

## 2023-09-05 ENCOUNTER — Ambulatory Visit: Admitting: Physical Therapy

## 2023-09-10 DIAGNOSIS — I2510 Atherosclerotic heart disease of native coronary artery without angina pectoris: Secondary | ICD-10-CM | POA: Diagnosis not present

## 2023-09-10 DIAGNOSIS — N529 Male erectile dysfunction, unspecified: Secondary | ICD-10-CM | POA: Diagnosis not present

## 2023-09-10 DIAGNOSIS — E1142 Type 2 diabetes mellitus with diabetic polyneuropathy: Secondary | ICD-10-CM | POA: Diagnosis not present

## 2023-09-10 DIAGNOSIS — I959 Hypotension, unspecified: Secondary | ICD-10-CM | POA: Diagnosis not present

## 2023-09-10 DIAGNOSIS — I719 Aortic aneurysm of unspecified site, without rupture: Secondary | ICD-10-CM | POA: Diagnosis not present

## 2023-09-10 DIAGNOSIS — E1169 Type 2 diabetes mellitus with other specified complication: Secondary | ICD-10-CM | POA: Diagnosis not present

## 2023-09-10 DIAGNOSIS — I10 Essential (primary) hypertension: Secondary | ICD-10-CM | POA: Diagnosis not present

## 2023-09-11 ENCOUNTER — Encounter: Payer: Self-pay | Admitting: Physical Therapy

## 2023-09-11 ENCOUNTER — Ambulatory Visit: Admitting: Physical Therapy

## 2023-09-11 DIAGNOSIS — M25662 Stiffness of left knee, not elsewhere classified: Secondary | ICD-10-CM

## 2023-09-11 DIAGNOSIS — M25562 Pain in left knee: Secondary | ICD-10-CM | POA: Diagnosis not present

## 2023-09-11 DIAGNOSIS — R2689 Other abnormalities of gait and mobility: Secondary | ICD-10-CM | POA: Diagnosis not present

## 2023-09-11 DIAGNOSIS — R2681 Unsteadiness on feet: Secondary | ICD-10-CM

## 2023-09-11 NOTE — Therapy (Signed)
 OUTPATIENT PHYSICAL THERAPY NEURO TREATMENT NOTE   Patient Name: Christopher Acevedo MRN: [id number] DOB:1940-10-05, 83 y.o., male Today's Date: 09/11/2023   PCP: Verena Mems, MD REFERRING PROVIDER: Verena Mems, MD  END OF SESSION:  PT End of Session - 09/11/23 1049     Visit Number 2    Number of Visits 13    Date for PT Re-Evaluation 10/10/23    Authorization Type [employer]    Authorization Time Period 8/6//25-11/27/23    Authorization - Visit Number 2    Authorization - Number of Visits 8    PT Start Time 1053    Activity Tolerance Patient tolerated treatment well    Behavior During Therapy WFL for tasks assessed/performed           Past Medical History:  Diagnosis Date   Actinic keratosis    DRY SKIN WITH LESIONS- MOSTLY ON FEET AND LOWER LEGS   Arthritis    Cancer (HCC)    nose basal cell carcinoma, neck melanoma   Chronic sinusitis    nasal polyp   Diverticulosis    LOWER ABD PAIN AND DIARRHEA - MOST RECENT EPISODE WAS 11/25/13- BETTER TODAY   Dry eyes    Elevated cholesterol    Environmental and seasonal allergies    GERD (gastroesophageal reflux disease)    Glaucoma    LEFT EYE   Gout    Hearing loss    BOTH EARS- HAS HEARING AIDS   Hypertension    Pre-diabetes    Sleep apnea    uses nightly   Spasm of eyelids    PT [facility]   Past Surgical History:  Procedure Laterality Date   BLEPHROPLASTY Bilateral    COLONOSCOPY W/ BIOPSIES AND POLYPECTOMY     CYST EXCISION Left 03/04/2020   Procedure: MUCOID CYST EXCISION WITH INTERPHALANGEAL JOINT DEBRIDEMENT;  Surgeon: Murrell Kuba, MD;  Location: [facility];  Service: Orthopedics;  Laterality: Left;  FOREARM BLOCK   HERNIA REPAIR  1990s?   NASAL SINUS SURGERY     POLYPECTOMY N/A 03/10/2016   Procedure: POLYPECTOMY NASAL;  Surgeon: Alm Bouche, MD;  Location: [facility] OR;  Service: ENT;  Laterality: N/A;   RIGHT SHOULDER  SURGERY     RIGHT THUMB AND INDEX FINGER SURGERY FOR ARTHRITIS     SINUS ENDO WITH FUSION Bilateral 03/10/2016   Procedure: BILATERAL REVISION SINUS ENDOSCOPIC WITH FUSION;  Surgeon: Alm Bouche, MD;  Location: [facility] OR;  Service: ENT;  Laterality: Bilateral;   TRANSANAL HEMORRHOIDAL DEARTERIALIZATION N/A 12/02/2013   Procedure: TRANSANAL HEMORRHOIDAL DEARTERIALIZATION AND LIGATION/PEXY, EXTERNAL HEMORRHOIDECTOMY, REMOVAL OF GLUTEAL MASS;  Surgeon: Elspeth Schultze, MD;  Location: [facility] ORS;  Service: General;  Laterality: N/A;   Patient Active Problem List   Diagnosis Date Noted   AAA (abdominal aortic aneurysm) without rupture (HCC) 07/10/2015   Arthropathia 07/10/2015   Blepharospasm 07/10/2015   Arteriosclerosis of coronary artery 07/10/2015   Hemorrhoids without complication 07/10/2015   Essential (primary) hypertension 07/10/2015   Agnogenic myeloid metaplasia (HCC) 07/10/2015   Apnea, sleep 07/10/2015   Fatty infiltration of liver 07/10/2015   Nasal polyposis 02/16/2015   Perennial and seasonal allergic rhinitis 02/16/2015    ONSET DATE: July 08, 2023  REFERRING DIAG: R26.89 (ICD-10-CM) - Other abnormalities of gait and mobility  THERAPY DIAG:  Acute pain of left knee  Stiffness of left knee, not elsewhere classified  Other abnormalities of gait and mobility  Unsteadiness  on feet  Rationale for Evaluation and Treatment: Rehabilitation  SUBJECTIVE:                                                                                                                                                                                             SUBJECTIVE STATEMENT: Had low blood pressure and had to go to the ED.  The blood pressure is evening out.  Feel like the leg is getting better.  Trying to go without a walker.  No falls.     Pt accompanied by: self  PERTINENT HISTORY: HLD, gout, HTN, B hearing loss, R shoulder surgery; per pt- reports R ankle surgery   PAIN:  Are you having  pain? Yes: NPRS scale: little pain Pain location: L medial tibia, L calf  Pain description: dull Aggravating factors: sitting on a low commode, throwing the leg up into bed  Relieving factors: repositioning   PRECAUTIONS: Fall Per Sports Medicine MD note on 08/06/23 he has been able to weight-bear and advance his activity.  He has had no displacement of fracture on his radiographs today.  He may continue to return to activity as tolerated and call us  if any issues in the coming month or 2 before he reaches a full recovery  RED FLAGS: None   WEIGHT BEARING RESTRICTIONS: No  FALLS: Has patient fallen in last 6 months? Yes. Number of falls 1  LIVING ENVIRONMENT: Lives with: lives alone; wife recently deceased but daughters come by to assist with chores  Lives in: House/apartment Stairs: ramp entry; 1 step from sunroom, 1 step into den Has following equipment at home: Single point cane, Environmental consultant - 2 wheeled, Environmental consultant - 4 wheeled, and Grab bars  PLOF: Independent and Vocation/Vocational requirements: pt enjoys golf  PATIENT GOALS: improve balance and pain   OBJECTIVE:   TODAY'S TREATMENT: 09/11/2023 Activity Comments  FGA:  20/30 Scores <22/30 indicate increased fall risk  Review of HEP Supine knee flexion/heel slides SLR Sit to stand Gastroc stretch   Cues for slower pace, quad set Cues for slow pace  Stair negotiation:  bilat rails step through pattern   L knee flexion 115 degrees   Quad set, 10 reps, 3 sec Towel under knee  Long arc quad 2 x 10 reps, LLE Cues to not hold breath  Seated ankle pumps 10 reps   Gait training with cane, 100 ft x 2 reps, then 50 ft x 2 reps Initial cues for sequence, RUE cane and LLE; good sequence and more upright posture    [facility] PT Assessment - 09/11/23 1104       Functional Gait  Assessment   Gait assessed  Yes    Gait Level Surface Walks 20 ft, slow speed, abnormal gait pattern, evidence for imbalance or deviates 10-15 in outside of the  12 in walkway width. Requires more than 7 sec to ambulate 20 ft.   7.25 sec   Change in Gait Speed Able to change speed, demonstrates mild gait deviations, deviates 6-10 in outside of the 12 in walkway width, or no gait deviations, unable to achieve a major change in velocity, or uses a change in velocity, or uses an assistive device.    Gait with Horizontal Head Turns Performs head turns smoothly with no change in gait. Deviates no more than 6 in outside 12 in walkway width    Gait with Vertical Head Turns Performs head turns with no change in gait. Deviates no more than 6 in outside 12 in walkway width.    Gait and Pivot Turn Pivot turns safely within 3 sec and stops quickly with no loss of balance.    Step Over Obstacle Is able to step over one shoe box (4.5 in total height) without changing gait speed. No evidence of imbalance.    Gait with Narrow Base of Support Ambulates 4-7 steps.    Gait with Eyes Closed Walks 20 ft, slow speed, abnormal gait pattern, evidence for imbalance, deviates 10-15 in outside 12 in walkway width. Requires more than 9 sec to ambulate 20 ft.   14.63 sec   Ambulating Backwards Walks 20 ft, uses assistive device, slower speed, mild gait deviations, deviates 6-10 in outside 12 in walkway width.   19.91 sec   Steps Alternating feet, must use rail.    Total Score 20         Access Code: [email or web address] URL: [email or web address] Date: 09/11/2023 Prepared by: [facility] - Outpatient  Rehab - [facility]  Program Notes do not push into pain!  Exercises - Standing Gastroc Stretch at Counter  - 1 x daily - 5 x weekly - 2 sets - 10 reps - 30 sec  hold - Supine Heel Slide with Strap  - 1 x daily - 5 x weekly - 2 sets - 10 reps - Supine Active Straight Leg Raise  - 1 x daily - 5 x weekly - 2 sets - 10 reps - Sit to Stand Without Arm Support  - 1 x daily - 5 x weekly - 2 sets - 10 reps - Seated Long Arc Quad  - 1 x daily - 5 x weekly - 3 sets - 10 reps -  Seated Ankle Dorsiflexion AROM  - 1-2 x daily - 7 x weekly - 3 sets - 10 reps  PATIENT EDUCATION: Education details: HEP additions, use of cane Person educated: Patient Education method: Explanation, Demonstration, and Handouts Education comprehension: verbalized understanding, returned demonstration, and needs further education   ------------------------------------------- Note: Objective measures were completed at Evaluation unless otherwise noted.  DIAGNOSTIC FINDINGS: 08/06/23 R knee xray:   No acute displaced fracture or traumatic malalignment. Prior CT demonstrating a tibial plateau fracture is not available for comparison.  2.  Small joint effusion.  3.  Mild tricompartmental degenerative changes.   COGNITION: Overall cognitive status: Within functional limits for tasks assessed   SENSATION: Pt reports N/T in the L posterior calf   POSTURE: rounded shoulders and forward head  PALPATION: no TTP over knee, slightly sore over L calf and muscle restriction noted. No redness, warmth, and no calf swelling but mild edema evident over L medial  knee  LOWER EXTREMITY ROM:     Active  Right Eval Left Eval  Hip flexion    Hip extension    Hip abduction    Hip adduction    Hip internal rotation    Hip external rotation    Knee flexion  105 *pain  Knee extension  9 deg *pain  Ankle dorsiflexion  10  Ankle plantarflexion    Ankle inversion    Ankle eversion     (Blank rows = not tested)  LOWER EXTREMITY MMT:    MMT *in sitting Right Eval Left Eval  Hip flexion 5 5  Hip extension    Hip abduction 5 5  Hip adduction 4+ 4+  Hip internal rotation    Hip external rotation    Knee flexion 5 4  Knee extension 5 4  Ankle dorsiflexion 4+ 4  Ankle plantarflexion 4+ 4+  Ankle inversion    Ankle eversion    (Blank rows = not tested)   GAIT: Findings: Assistive device utilized:None, Level of assistance: Modified independence, and Comments: antalgic on L LE and R hip drop    FUNCTIONAL TESTS:  5 times sit to stand: 13.68 sec  10 meter walk test: 12.02 sec without AD (2.73 ft/sec)                                                                                                                                TREATMENT DATE: 08/29/23    PATIENT EDUCATION: Education details: edu on use of ice 10 min at a time for swelling, prognosis, POC, HEP Person educated: Patient Education method: Explanation, Demonstration, Tactile cues, Verbal cues, and Handouts Education comprehension: verbalized understanding  HOME EXERCISE PROGRAM: Access Code: [email or web address] URL: [email or web address] Date: 08/29/2023 Prepared by: [facility] - Outpatient  Rehab - [facility]  Program Notes do not push into pain!  Exercises - Standing Gastroc Stretch at Counter  - 1 x daily - 5 x weekly - 2 sets - 10 reps - 30 sec  hold - Supine Heel Slide with Strap  - 1 x daily - 5 x weekly - 2 sets - 10 reps - Supine Active Straight Leg Raise  - 1 x daily - 5 x weekly - 2 sets - 10 reps - Sit to Stand Without Arm Support  - 1 x daily - 5 x weekly - 2 sets - 10 reps   GOALS: Goals reviewed with patient? Yes  SHORT TERM GOALS: Target date: 09/19/2023  Patient to be independent with initial HEP. Baseline: HEP initiated Goal status: INITIAL    LONG TERM GOALS: Target date: 10/10/2023  Patient to be independent with advanced HEP. Baseline: Not yet initiated  Goal status: INITIAL  Patient to demonstrate B LE strength >/=4+/5.  Baseline: See above Goal status: INITIAL  Patient to demonstrate L knee AROM WFL and without pain limiting.  Baseline: 9-105 deg and painful  Goal status: INITIAL  Patient to  demonstrate symmetric step length and stance time with ambulation with LRAD. Baseline: analgic on L LE Goal status: INITIAL  Patient to score at least 23/30 on FGA in order to decrease risk of falls.  Baseline: 20/30 09/11/2023 Goal status: INITIAL  Patient to report  tolerance for 30-60 minutes of weightbearing activity without L knee pain limiting.  Baseline: reports reduced activity tolerance  Goal status: INITIAL   ASSESSMENT:  CLINICAL IMPRESSION: Pt presents today with reports that he had some low heart rate issues and cancelled several visits.  He reports that has resolved and BP measures earlier today was 112/72.  Reviewed and progressed HEP for additional LLE strengthening.  Utilized cane with gait training and pt does a nice job with sequencing and has less antalgic pattern with cane. Assessed FGA and score of 20/30 indicates increased fall risk; recommended use of cane with long distance gait.   Pt will continue to benefit from skilled PT towards goals for improved functional mobility and decreased fall risk.   OBJECTIVE IMPAIRMENTS: Abnormal gait, decreased activity tolerance, decreased balance, decreased ROM, decreased strength, impaired flexibility, postural dysfunction, and pain.   ACTIVITY LIMITATIONS: carrying, lifting, bending, standing, squatting, stairs, transfers, bathing, toileting, dressing, reach over head, hygiene/grooming, and locomotion level  PARTICIPATION LIMITATIONS: meal prep, cleaning, laundry, shopping, community activity, yard work, and church  PERSONAL FACTORS: Age, Past/current experiences, Time since onset of injury/illness/exacerbation, and 3+ comorbidities: HLD, gout, HTN, B hearing loss, R shoulder surgery are also affecting patient's functional outcome.   REHAB POTENTIAL: Good  CLINICAL DECISION MAKING: Evolving/moderate complexity  EVALUATION COMPLEXITY: Moderate  PLAN:  PT FREQUENCY: 1-2x/week  PT DURATION: 6 weeks  PLANNED INTERVENTIONS: 97164- PT Re-evaluation, 97110-Therapeutic exercises, 97530- Therapeutic activity, 97112- Neuromuscular re-education, 97535- Self Care, [id number]- Manual therapy, [phone]- Gait training, [phone]- Canalith repositioning, [id number]- Aquatic Therapy, [phone] (1-2 muscles), [phone] (3+  muscles)- Dry Needling, Patient/Family education, Balance training, Stair training, Taping, Joint mobilization, Spinal mobilization, Vestibular training, DME instructions, Cryotherapy, and Moist heat  PLAN FOR NEXT SESSION: (Pt wanted to go down to 1x/wk due to financial concerns); review HEP updates and progress for L LE strength. work on increasing L WBAT to improve timing of gait  Referring diagnosis? R26.89 (ICD-10-CM) - Other abnormalities of gait and mobility Treatment diagnosis? (if different than referring diagnosis)  Acute pain of left knee  Stiffness of left knee, not elsewhere classified  Other abnormalities of gait and mobility  Unsteadiness on feet What was this (referring dx) caused by? []  Surgery [x]  Fall []  Ongoing issue []  Arthritis []  Other: ____________  Laterality: []  Rt [x]  Lt []  Both  Check all possible CPT codes:  *CHOOSE 10 OR LESS*    See Planned Interventions listed in the Plan section of the Evaluation.     Greig Anon, PT 09/11/23 10:54 AM Phone: [phone] Fax: [phone]   [facility] at [facility] [address] [place], [address] [place], [place] [address] Phone # [phone] Fax # [phone]

## 2023-09-13 ENCOUNTER — Ambulatory Visit: Admitting: Physical Therapy

## 2023-09-18 ENCOUNTER — Ambulatory Visit

## 2023-09-18 DIAGNOSIS — M25662 Stiffness of left knee, not elsewhere classified: Secondary | ICD-10-CM | POA: Diagnosis not present

## 2023-09-18 DIAGNOSIS — M25562 Pain in left knee: Secondary | ICD-10-CM | POA: Diagnosis not present

## 2023-09-18 DIAGNOSIS — R2681 Unsteadiness on feet: Secondary | ICD-10-CM

## 2023-09-18 DIAGNOSIS — R2689 Other abnormalities of gait and mobility: Secondary | ICD-10-CM

## 2023-09-18 NOTE — Therapy (Signed)
 OUTPATIENT PHYSICAL THERAPY NEURO TREATMENT NOTE   Patient Name: Christopher Acevedo MRN: [id number] DOB:30-Aug-1940, 83 y.o., male Today's Date: 09/18/2023   PCP: Verena Mems, MD REFERRING PROVIDER: Verena Mems, MD  END OF SESSION:  PT End of Session - 09/18/23 1101     Visit Number 3    Number of Visits 13    Date for PT Re-Evaluation 10/10/23    Authorization Type [employer]    Authorization Time Period 8/6//25-11/27/23    Authorization - Visit Number 3    Authorization - Number of Visits 8    PT Start Time 1101    PT Stop Time 1145    PT Time Calculation (min) 44 min    Equipment Utilized During Treatment Gait belt    Activity Tolerance Patient tolerated treatment well    Behavior During Therapy WFL for tasks assessed/performed           Past Medical History:  Diagnosis Date   Actinic keratosis    DRY SKIN WITH LESIONS- MOSTLY ON FEET AND LOWER LEGS   Arthritis    Cancer (HCC)    nose basal cell carcinoma, neck melanoma   Chronic sinusitis    nasal polyp   Diverticulosis    LOWER ABD PAIN AND DIARRHEA - MOST RECENT EPISODE WAS 11/25/13- BETTER TODAY   Dry eyes    Elevated cholesterol    Environmental and seasonal allergies    GERD (gastroesophageal reflux disease)    Glaucoma    LEFT EYE   Gout    Hearing loss    BOTH EARS- HAS HEARING AIDS   Hypertension    Pre-diabetes    Sleep apnea    uses nightly   Spasm of eyelids    PT GET[facility]   Past Surgical History:  Procedure Laterality Date   BLEPHROPLASTY Bilateral    COLONOSCOPY W/ BIOPSIES AND POLYPECTOMY     CYST EXCISION Left 03/04/2020   Procedure: MUCOID CYST EXCISION WITH INTERPHALANGEAL JOINT DEBRIDEMENT;  Surgeon: Murrell Kuba, MD;  Location: [facility];  Service: Orthopedics;  Laterality: Left;  FOREARM BLOCK   HERNIA REPAIR  1990s?   NASAL SINUS SURGERY     POLYPECTOMY N/A 03/10/2016   Procedure: POLYPECTOMY  NASAL;  Surgeon: Alm Bouche, MD;  Location: [facility] OR;  Service: ENT;  Laterality: N/A;   RIGHT SHOULDER SURGERY     RIGHT THUMB AND INDEX FINGER SURGERY FOR ARTHRITIS     SINUS ENDO WITH FUSION Bilateral 03/10/2016   Procedure: BILATERAL REVISION SINUS ENDOSCOPIC WITH FUSION;  Surgeon: Alm Bouche, MD;  Location: [facility] OR;  Service: ENT;  Laterality: Bilateral;   TRANSANAL HEMORRHOIDAL DEARTERIALIZATION N/A 12/02/2013   Procedure: TRANSANAL HEMORRHOIDAL DEARTERIALIZATION AND LIGATION/PEXY, EXTERNAL HEMORRHOIDECTOMY, REMOVAL OF GLUTEAL MASS;  Surgeon: Elspeth Schultze, MD;  Location: [facility];  Service: General;  Laterality: N/A;   Patient Active Problem List   Diagnosis Date Noted   AAA (abdominal aortic aneurysm) without rupture (HCC) 07/10/2015   Arthropathia 07/10/2015   Blepharospasm 07/10/2015   Arteriosclerosis of coronary artery 07/10/2015   Hemorrhoids without complication 07/10/2015   Essential (primary) hypertension 07/10/2015   Agnogenic myeloid metaplasia (HCC) 07/10/2015   Apnea, sleep 07/10/2015   Fatty infiltration of liver 07/10/2015   Nasal polyposis 02/16/2015   Perennial and seasonal allergic rhinitis 02/16/2015    ONSET DATE: July 08, 2023  REFERRING DIAG: R26.89 (ICD-10-CM) - Other abnormalities of gait and mobility  THERAPY DIAG:  Acute pain of left knee  Stiffness of left knee, not elsewhere classified  Other abnormalities of gait and mobility  Unsteadiness on feet  Rationale for Evaluation and Treatment: Rehabilitation  SUBJECTIVE:                                                                                                                                                                                             SUBJECTIVE STATEMENT: Left knee not too painful, just a twinge with certain positions. Notes still some lingering lightheadedness with position changes.   Pt accompanied by: self  PERTINENT HISTORY: HLD, gout, HTN, B hearing loss, R  shoulder surgery; per pt- reports R ankle surgery   PAIN:  Are you having pain? Yes: NPRS scale: little pain Pain location: L medial tibia, L calf  Pain description: dull Aggravating factors: sitting on a low commode, throwing the leg up into bed  Relieving factors: repositioning   PRECAUTIONS: Fall Per Sports Medicine MD note on 08/06/23 he has been able to weight-bear and advance his activity.  He has had no displacement of fracture on his radiographs today.  He may continue to return to activity as tolerated and call us  if any issues in the coming month or 2 before he reaches a full recovery  RED FLAGS: None   WEIGHT BEARING RESTRICTIONS: No  FALLS: Has patient fallen in last 6 months? Yes. Number of falls 1  LIVING ENVIRONMENT: Lives with: lives alone; wife recently deceased but daughters come by to assist with chores  Lives in: House/apartment Stairs: ramp entry; 1 step from sunroom, 1 step into den Has following equipment at home: Single point cane, Environmental consultant - 2 wheeled, Environmental consultant - 4 wheeled, and Grab bars  PLOF: Independent and Vocation/Vocational requirements: pt enjoys golf  PATIENT GOALS: improve balance and pain   OBJECTIVE:   TODAY'S TREATMENT: 09/18/23 Activity Comments  NU-step LE only level 5 x 6 min   QS 2x10 Heel slides 2x10-with sheet for pull Knee extension QS 2x10 SLR 2x10 LAQ 2x10 STS 2x10 Ankle DF 2x10 110 left knee flexion  Semi-tandem in corner 3x30 sec               TODAY'S TREATMENT: 09/11/2023 Activity Comments  FGA:  20/30 Scores <22/30 indicate increased fall risk  Review of HEP Supine knee flexion/heel slides SLR Sit to stand Gastroc stretch   Cues for slower pace, quad set Cues for slow pace  Stair negotiation:  bilat rails step through pattern   L knee flexion 115 degrees   Quad set, 10 reps, 3 sec Towel under knee  Long  arc quad 2 x 10 reps, LLE Cues to not hold breath  Seated ankle pumps 10 reps   Gait training with cane,  100 ft x 2 reps, then 50 ft x 2 reps Initial cues for sequence, RUE cane and LLE; good sequence and more upright posture     Access Code: [email or web address] URL: [email or web address] Date: 09/11/2023 Prepared by: [facility] - Outpatient  Rehab - [facility]  Program Notes do not push into pain!  Exercises - Standing Gastroc Stretch at Counter  - 1 x daily - 5 x weekly - 2 sets - 10 reps - 30 sec  hold - Supine Heel Slide with Strap  - 1 x daily - 5 x weekly - 2 sets - 10 reps - Supine Active Straight Leg Raise  - 1 x daily - 5 x weekly - 2 sets - 10 reps - Sit to Stand Without Arm Support  - 1 x daily - 5 x weekly - 2 sets - 10 reps - Seated Long Arc Quad  - 1 x daily - 5 x weekly - 3 sets - 10 reps - Seated Ankle Dorsiflexion AROM  - 1-2 x daily - 7 x weekly - 3 sets - 10 reps - Supine Knee Extension Stretch on Towel Roll  - 1 x daily - 7 x weekly - 3 sets - 10 reps - 3 sec hold - Semi-Tandem Corner Balance With Eyes Open  - 1 x daily - 7 x weekly - 3 sets - 30 sec hold  PATIENT EDUCATION: Education details: HEP additions, use of cane Person educated: Patient Education method: Explanation, Demonstration, and Handouts Education comprehension: verbalized understanding, returned demonstration, and needs further education   ------------------------------------------- Note: Objective measures were completed at Evaluation unless otherwise noted.  DIAGNOSTIC FINDINGS: 08/06/23 R knee xray:   No acute displaced fracture or traumatic malalignment. Prior CT demonstrating a tibial plateau fracture is not available for comparison.  2.  Small joint effusion.  3.  Mild tricompartmental degenerative changes.   COGNITION: Overall cognitive status: Within functional limits for tasks assessed   SENSATION: Pt reports N/T in the L posterior calf   POSTURE: rounded shoulders and forward head  PALPATION: no TTP over knee, slightly sore over L calf and muscle restriction noted. No  redness, warmth, and no calf swelling but mild edema evident over L medial knee  LOWER EXTREMITY ROM:     Active  Right Eval Left Eval  Hip flexion    Hip extension    Hip abduction    Hip adduction    Hip internal rotation    Hip external rotation    Knee flexion  105 *pain  Knee extension  9 deg *pain  Ankle dorsiflexion  10  Ankle plantarflexion    Ankle inversion    Ankle eversion     (Blank rows = not tested)  LOWER EXTREMITY MMT:    MMT *in sitting Right Eval Left Eval  Hip flexion 5 5  Hip extension    Hip abduction 5 5  Hip adduction 4+ 4+  Hip internal rotation    Hip external rotation    Knee flexion 5 4  Knee extension 5 4  Ankle dorsiflexion 4+ 4  Ankle plantarflexion 4+ 4+  Ankle inversion    Ankle eversion    (Blank rows = not tested)   GAIT: Findings: Assistive device utilized:None, Level of assistance: Modified independence, and Comments: antalgic on L LE and R hip drop   FUNCTIONAL  TESTS:  5 times sit to stand: 13.68 sec  10 meter walk test: 12.02 sec without AD (2.73 ft/sec)                                                                                                                                   GOALS: Goals reviewed with patient? Yes  SHORT TERM GOALS: Target date: 09/19/2023  Patient to be independent with initial HEP. Baseline: HEP initiated Goal status: INITIAL    LONG TERM GOALS: Target date: 10/10/2023  Patient to be independent with advanced HEP. Baseline: Not yet initiated  Goal status: INITIAL  Patient to demonstrate B LE strength >/=4+/5.  Baseline: See above Goal status: INITIAL  Patient to demonstrate L knee AROM WFL and without pain limiting.  Baseline: 9-105 deg and painful  Goal status: INITIAL  Patient to demonstrate symmetric step length and stance time with ambulation with LRAD. Baseline: analgic on L LE Goal status: INITIAL  Patient to score at least 23/30 on FGA in order to decrease risk of  falls.  Baseline: 20/30 09/11/2023 Goal status: INITIAL  Patient to report tolerance for 30-60 minutes of weightbearing activity without L knee pain limiting.  Baseline: reports reduced activity tolerance  Goal status: INITIAL   ASSESSMENT:  CLINICAL IMPRESSION: Nu-step to improve BLE endurance and ROM. HEP review with cues for sequence and execution and instructed in progressions to increase knee flexion and terminal knee extension LLE to restore mobility and gait pattern. Good tolerance to increased activities pushing to end-range for flexion and extension without increase pain demo 110 left knee flexion and about -5 extension. Static balance to improve stability and LE engagement. Continued sessions to progress POC details to restore mobility to PLOF w/out need for AD  OBJECTIVE IMPAIRMENTS: Abnormal gait, decreased activity tolerance, decreased balance, decreased ROM, decreased strength, impaired flexibility, postural dysfunction, and pain.   ACTIVITY LIMITATIONS: carrying, lifting, bending, standing, squatting, stairs, transfers, bathing, toileting, dressing, reach over head, hygiene/grooming, and locomotion level  PARTICIPATION LIMITATIONS: meal prep, cleaning, laundry, shopping, community activity, yard work, and church  PERSONAL FACTORS: Age, Past/current experiences, Time since onset of injury/illness/exacerbation, and 3+ comorbidities: HLD, gout, HTN, B hearing loss, R shoulder surgery are also affecting patient's functional outcome.   REHAB POTENTIAL: Good  CLINICAL DECISION MAKING: Evolving/moderate complexity  EVALUATION COMPLEXITY: Moderate  PLAN:  PT FREQUENCY: 1-2x/week  PT DURATION: 6 weeks  PLANNED INTERVENTIONS: 97164- PT Re-evaluation, 97110-Therapeutic exercises, 97530- Therapeutic activity, 97112- Neuromuscular re-education, 97535- Self Care, 02859- Manual therapy, [phone]- Gait training, [phone]- Canalith repositioning, [id number]- Aquatic Therapy, [phone] (1-2 muscles),  20561 (3+ muscles)- Dry Needling, Patient/Family education, Balance training, Stair training, Taping, Joint mobilization, Spinal mobilization, Vestibular training, DME instructions, Cryotherapy, and Moist heat  PLAN FOR NEXT SESSION: (Pt wanted to go down to 1x/wk due to financial concerns); review HEP updates and progress for L LE strength. work on increasing L WBAT to improve timing of gait  12:05 PM, 09/18/23 M. Kelly Ruari Mudgett, PT, DPT Physical Therapist- [place] Office Number: [phone]

## 2023-09-20 ENCOUNTER — Ambulatory Visit: Admitting: Physical Therapy

## 2023-09-25 ENCOUNTER — Encounter: Payer: Self-pay | Admitting: Physical Therapy

## 2023-09-25 ENCOUNTER — Ambulatory Visit: Attending: Family Medicine | Admitting: Physical Therapy

## 2023-09-25 DIAGNOSIS — R2681 Unsteadiness on feet: Secondary | ICD-10-CM | POA: Diagnosis not present

## 2023-09-25 DIAGNOSIS — M25662 Stiffness of left knee, not elsewhere classified: Secondary | ICD-10-CM | POA: Insufficient documentation

## 2023-09-25 DIAGNOSIS — R2689 Other abnormalities of gait and mobility: Secondary | ICD-10-CM | POA: Insufficient documentation

## 2023-09-25 DIAGNOSIS — M25562 Pain in left knee: Secondary | ICD-10-CM | POA: Diagnosis not present

## 2023-09-25 NOTE — Therapy (Signed)
 OUTPATIENT PHYSICAL THERAPY NEURO TREATMENT NOTE   Patient Name: Christopher Acevedo MRN: [id number] DOB:[birth year], 83 y.o., male Today's Date: 09/25/2023   PCP: Verena Mems, MD REFERRING PROVIDER: Verena Mems, MD  END OF SESSION:  PT End of Session - 09/25/23 1058     Visit Number 4    Number of Visits 13    Date for PT Re-Evaluation 10/10/23    Authorization Type [employer]    Authorization Time Period 8/6//25-11/27/23    Authorization - Visit Number 4    Authorization - Number of Visits 8    PT Start Time 1102    PT Stop Time 1142    PT Time Calculation (min) 40 min    Equipment Utilized During Treatment --    Activity Tolerance Patient tolerated treatment well    Behavior During Therapy WFL for tasks assessed/performed            Past Medical History:  Diagnosis Date   Actinic keratosis    DRY SKIN WITH LESIONS- MOSTLY ON FEET AND LOWER LEGS   Arthritis    Cancer (HCC)    nose basal cell carcinoma, neck melanoma   Chronic sinusitis    nasal polyp   Diverticulosis    LOWER ABD PAIN AND DIARRHEA - MOST RECENT EPISODE WAS 11/25/13- BETTER TODAY   Dry eyes    Elevated cholesterol    Environmental and seasonal allergies    GERD (gastroesophageal reflux disease)    Glaucoma    LEFT EYE   Gout    Hearing loss    BOTH EARS- HAS HEARING AIDS   Hypertension    Pre-diabetes    Sleep apnea    uses nightly   Spasm of eyelids    PT GET[facility]   Past Surgical History:  Procedure Laterality Date   BLEPHROPLASTY Bilateral    COLONOSCOPY W/ BIOPSIES AND POLYPECTOMY     CYST EXCISION Left 03/04/2020   Procedure: MUCOID CYST EXCISION WITH INTERPHALANGEAL JOINT DEBRIDEMENT;  Surgeon: Murrell Kuba, MD;  Location: [facility];  Service: Orthopedics;  Laterality: Left;  FOREARM BLOCK   HERNIA REPAIR  1990s?   NASAL SINUS SURGERY     POLYPECTOMY N/A 03/10/2016   Procedure: POLYPECTOMY NASAL;   Surgeon: Alm Bouche, MD;  Location: [facility] OR;  Service: ENT;  Laterality: N/A;   RIGHT SHOULDER SURGERY     RIGHT THUMB AND INDEX FINGER SURGERY FOR ARTHRITIS     SINUS ENDO WITH FUSION Bilateral 03/10/2016   Procedure: BILATERAL REVISION SINUS ENDOSCOPIC WITH FUSION;  Surgeon: Alm Bouche, MD;  [facility];  Service: ENT;  Laterality: Bilateral;   TRANSANAL HEMORRHOIDAL DEARTERIALIZATION N/A 12/02/2013   Procedure: TRANSANAL HEMORRHOIDAL DEARTERIALIZATION AND LIGATION/PEXY, EXTERNAL HEMORRHOIDECTOMY, REMOVAL OF GLUTEAL MASS;  Surgeon: Elspeth Schultze, MD;  Location: [facility];  Service: General;  Laterality: N/A;   Patient Active Problem List   Diagnosis Date Noted   AAA (abdominal aortic aneurysm) without rupture (HCC) 07/10/2015   Arthropathia 07/10/2015   Blepharospasm 07/10/2015   Arteriosclerosis of coronary artery 07/10/2015   Hemorrhoids without complication 07/10/2015   Essential (primary) hypertension 07/10/2015   Agnogenic myeloid metaplasia (HCC) 07/10/2015   Apnea, sleep 07/10/2015   Fatty infiltration of liver 07/10/2015   Nasal polyposis 02/16/2015   Perennial and seasonal allergic rhinitis 02/16/2015    ONSET DATE: July 08, 2023  REFERRING DIAG: R26.89 (ICD-10-CM) - Other abnormalities of gait and mobility  THERAPY DIAG:  Other abnormalities of gait and mobility  Unsteadiness on feet  Stiffness of left knee, not elsewhere classified  Rationale for Evaluation and Treatment: Rehabilitation  SUBJECTIVE:                                                                                                                                                                                             SUBJECTIVE STATEMENT: Feel like the arthritis is there, as the knee is stiff with first getting up.   Pt accompanied by: self  PERTINENT HISTORY: HLD, gout, HTN, B hearing loss, R shoulder surgery; per pt- reports R ankle surgery   PAIN:  Are you having pain? Yes: NPRS  scale: very little pain Pain location: L medial tibia, L calf  Pain description: dull Aggravating factors: sitting on a low commode, throwing the leg up into bed  Relieving factors: repositioning   PRECAUTIONS: Fall Per Sports Medicine MD note on 08/06/23 he has been able to weight-bear and advance his activity.  He has had no displacement of fracture on his radiographs today.  He may continue to return to activity as tolerated and call us  if any issues in the coming month or 2 before he reaches a full recovery  RED FLAGS: None   WEIGHT BEARING RESTRICTIONS: No  FALLS: Has patient fallen in last 6 months? Yes. Number of falls 1  LIVING ENVIRONMENT: Lives with: lives alone; wife recently deceased but daughters come by to assist with chores  Lives in: House/apartment Stairs: ramp entry; 1 step from sunroom, 1 step into den Has following equipment at home: Single point cane, Environmental consultant - 2 wheeled, Environmental consultant - 4 wheeled, and Grab bars  PLOF: Independent and Vocation/Vocational requirements: pt enjoys golf  PATIENT GOALS: improve balance and pain   OBJECTIVE:    TODAY'S TREATMENT: 09/25/2023 Activity Comments  HEP Review Cues for technique-pt reports he is not doing all the exercises regularly  Standing feet apart/semi-tandem  EO head turns/nods x 5 reps each Light UE support semi-tandem position  Forward/back walking at counter, 5 reps   Sidestepping along counter 5 reps   Tandem forward walk, 3 reps Cues to look ahead, light UE support  NuStep, Level 5, 6 minutes for leg flexibility and strengthening BLEs only, >80 SPM, fatigue at end       Access Code: [email or web address] URL: [email or web address] Date: 09/11/2023 Prepared by: [facility] - Outpatient  Rehab - [facility]  Program Notes do not push into pain!  Exercises - Standing Gastroc Stretch at Counter  - 1 x daily - 5 x weekly - 2 sets - 10 reps -  30 sec  hold - Supine Heel Slide with Strap  - 1 x daily - 5 x  weekly - 2 sets - 10 reps - Supine Active Straight Leg Raise  - 1 x daily - 5 x weekly - 2 sets - 10 reps - Sit to Stand Without Arm Support  - 1 x daily - 5 x weekly - 2 sets - 10 reps - Seated Long Arc Quad  - 1 x daily - 5 x weekly - 3 sets - 10 reps - Seated Ankle Dorsiflexion AROM  - 1-2 x daily - 7 x weekly - 3 sets - 10 reps - Supine Knee Extension Stretch on Towel Roll  - 1 x daily - 7 x weekly - 3 sets - 10 reps - 3 sec hold - Semi-Tandem Corner Balance With Eyes Open  - 1 x daily - 7 x weekly - 3 sets - 30 sec hold  PATIENT EDUCATION: Education details: Review of HEP and educated on importance of consistent HEP performance; discussed options for continued community fitness-[employer], pool, Silver Sneakers (maybe [employer]) Person educated: Patient Education method: Programmer, multimedia, Facilities manager, and Handouts Education comprehension: verbalized understanding, returned demonstration, and needs further education   ------------------------------------------- Note: Objective measures were completed at Evaluation unless otherwise noted.  DIAGNOSTIC FINDINGS: 08/06/23 R knee xray:   No acute displaced fracture or traumatic malalignment. Prior CT demonstrating a tibial plateau fracture is not available for comparison.  2.  Small joint effusion.  3.  Mild tricompartmental degenerative changes.   COGNITION: Overall cognitive status: Within functional limits for tasks assessed   SENSATION: Pt reports N/T in the L posterior calf   POSTURE: rounded shoulders and forward head  PALPATION: no TTP over knee, slightly sore over L calf and muscle restriction noted. No redness, warmth, and no calf swelling but mild edema evident over L medial knee  LOWER EXTREMITY ROM:     Active  Right Eval Left Eval  Hip flexion    Hip extension    Hip abduction    Hip adduction    Hip internal rotation    Hip external rotation    Knee flexion  105 *pain  Knee extension  9 deg *pain  Ankle dorsiflexion   10  Ankle plantarflexion    Ankle inversion    Ankle eversion     (Blank rows = not tested)  LOWER EXTREMITY MMT:    MMT *in sitting Right Eval Left Eval  Hip flexion 5 5  Hip extension    Hip abduction 5 5  Hip adduction 4+ 4+  Hip internal rotation    Hip external rotation    Knee flexion 5 4  Knee extension 5 4  Ankle dorsiflexion 4+ 4  Ankle plantarflexion 4+ 4+  Ankle inversion    Ankle eversion    (Blank rows = not tested)   GAIT: Findings: Assistive device utilized:None, Level of assistance: Modified independence, and Comments: antalgic on L LE and R hip drop   FUNCTIONAL TESTS:  5 times sit to stand: 13.68 sec  10 meter walk test: 12.02 sec without AD (2.73 ft/sec)  GOALS: Goals reviewed with patient? Yes  SHORT TERM GOALS: Target date: 09/19/2023  Patient to be independent with initial HEP. Baseline: HEP initiated Goal status: NOT MET 09/25/2023    LONG TERM GOALS: Target date: 10/10/2023  Patient to be independent with advanced HEP. Baseline: Not yet initiated  Goal status: IN PROGRESS  Patient to demonstrate B LE strength >/=4+/5.  Baseline: See above Goal status: IN PROGRESS  Patient to demonstrate L knee AROM WFL and without pain limiting.  Baseline: 9-105 deg and painful  Goal status: IN PROGRESS  Patient to demonstrate symmetric step length and stance time with ambulation with LRAD. Baseline: analgic on L LE Goal status: IN PROGRESS  Patient to score at least 23/30 on FGA in order to decrease risk of falls.  Baseline: 20/30 09/11/2023 Goal status: IN PROGRESS  Patient to report tolerance for 30-60 minutes of weightbearing activity without L knee pain limiting.  Baseline: reports reduced activity tolerance  Goal status: IN PROGRESS   ASSESSMENT:  CLINICAL IMPRESSION: Pt presents today with reports of  some continued stiffness in knee, that resolves with movement.  He does endorse not doing his HEP as regularly as he should. Skilled PT session focused on review of HEP and pt needs cues throughout.  He has not yet met STG for HEP.  Also worked on Editor, commissioning and use of Nustep for lower extremity strength and flexibility; pt does feel better and less stiff at end of session.  Pt will continue to benefit from skilled PT towards long term goals for improved functional mobility and decreased fall risk.    OBJECTIVE IMPAIRMENTS: Abnormal gait, decreased activity tolerance, decreased balance, decreased ROM, decreased strength, impaired flexibility, postural dysfunction, and pain.   ACTIVITY LIMITATIONS: carrying, lifting, bending, standing, squatting, stairs, transfers, bathing, toileting, dressing, reach over head, hygiene/grooming, and locomotion level  PARTICIPATION LIMITATIONS: meal prep, cleaning, laundry, shopping, community activity, yard work, and church  PERSONAL FACTORS: Age, Past/current experiences, Time since onset of injury/illness/exacerbation, and 3+ comorbidities: HLD, gout, HTN, B hearing loss, R shoulder surgery are also affecting patient's functional outcome.   REHAB POTENTIAL: Good  CLINICAL DECISION MAKING: Evolving/moderate complexity  EVALUATION COMPLEXITY: Moderate  PLAN:  PT FREQUENCY: 1-2x/week  PT DURATION: 6 weeks  PLANNED INTERVENTIONS: 97164- PT Re-evaluation, 97110-Therapeutic exercises, 97530- Therapeutic activity, 97112- Neuromuscular re-education, 97535- Self Care, 02859- Manual therapy, [phone]- Gait training, [phone]- Canalith repositioning, [id number]- Aquatic Therapy, [phone] (1-2 muscles), 20561 (3+ muscles)- Dry Needling, Patient/Family education, Balance training, Stair training, Taping, Joint mobilization, Spinal mobilization, Vestibular training, DME instructions, Cryotherapy, and Moist heat  PLAN FOR NEXT SESSION: Progress strength and balance;  work on  increasing L stance to improve timing of gait   Greig Anon, PT 09/25/23 11:44 AM Phone: [phone] Fax: [phone]

## 2023-09-27 ENCOUNTER — Ambulatory Visit: Admitting: Physical Therapy

## 2023-09-28 NOTE — Therapy (Signed)
 OUTPATIENT PHYSICAL THERAPY NEURO TREATMENT NOTE   Patient Name: Christopher Acevedo MRN: [id number] DOB:09/23/1940, 83 y.o., male Today's Date: 10/01/2023   PCP: Verena Mems, MD REFERRING PROVIDER: Verena Mems, MD  END OF SESSION:  PT End of Session - 10/01/23 1055     Number of Visits 13    Date for PT Re-Evaluation 10/10/23    Authorization Type [employer]    Authorization Time Period 8/6//25-11/27/23    Authorization - Visit Number 5    Authorization - Number of Visits 8    PT Start Time 1019    PT Stop Time 1100    PT Time Calculation (min) 41 min    Equipment Utilized During Treatment Gait belt    Activity Tolerance Patient tolerated treatment well    Behavior During Therapy WFL for tasks assessed/performed             Past Medical History:  Diagnosis Date   Actinic keratosis    DRY SKIN WITH LESIONS- MOSTLY ON FEET AND LOWER LEGS   Arthritis    Cancer (HCC)    nose basal cell carcinoma, neck melanoma   Chronic sinusitis    nasal polyp   Diverticulosis    LOWER ABD PAIN AND DIARRHEA - MOST RECENT EPISODE WAS 11/25/13- BETTER TODAY   Dry eyes    Elevated cholesterol    Environmental and seasonal allergies    GERD (gastroesophageal reflux disease)    Glaucoma    LEFT EYE   Gout    Hearing loss    BOTH EARS- HAS HEARING AIDS   Hypertension    Pre-diabetes    Sleep apnea    uses nightly   Spasm of eyelids    PT [facility]   Past Surgical History:  Procedure Laterality Date   BLEPHROPLASTY Bilateral    COLONOSCOPY W/ BIOPSIES AND POLYPECTOMY     CYST EXCISION Left 03/04/2020   Procedure: MUCOID CYST EXCISION WITH INTERPHALANGEAL JOINT DEBRIDEMENT;  Surgeon: Murrell Kuba, MD;  Location: [facility];  Service: Orthopedics;  Laterality: Left;  FOREARM BLOCK   HERNIA REPAIR  1990s?   NASAL SINUS SURGERY     POLYPECTOMY N/A 03/10/2016   Procedure: POLYPECTOMY NASAL;  Surgeon:  Alm Bouche, MD;  Location: [facility] OR;  Service: ENT;  Laterality: N/A;   RIGHT SHOULDER SURGERY     RIGHT THUMB AND INDEX FINGER SURGERY FOR ARTHRITIS     SINUS ENDO WITH FUSION Bilateral 03/10/2016   Procedure: BILATERAL REVISION SINUS ENDOSCOPIC WITH FUSION;  Surgeon: Alm Bouche, MD;  Location: [facility] OR;  Service: ENT;  Laterality: Bilateral;   TRANSANAL HEMORRHOIDAL DEARTERIALIZATION N/A 12/02/2013   Procedure: TRANSANAL HEMORRHOIDAL DEARTERIALIZATION AND LIGATION/PEXY, EXTERNAL HEMORRHOIDECTOMY, REMOVAL OF GLUTEAL MASS;  Surgeon: Elspeth Schultze, MD;  Location: [facility];  Service: General;  Laterality: N/A;   Patient Active Problem List   Diagnosis Date Noted   AAA (abdominal aortic aneurysm) without rupture (HCC) 07/10/2015   Arthropathia 07/10/2015   Blepharospasm 07/10/2015   Arteriosclerosis of coronary artery 07/10/2015   Hemorrhoids without complication 07/10/2015   Essential (primary) hypertension 07/10/2015   Agnogenic myeloid metaplasia (HCC) 07/10/2015   Apnea, sleep 07/10/2015   Fatty infiltration of liver 07/10/2015   Nasal polyposis 02/16/2015   Perennial and seasonal allergic rhinitis 02/16/2015    ONSET DATE: July 08, 2023  REFERRING DIAG: R26.89 (ICD-10-CM) - Other abnormalities of gait and mobility  THERAPY DIAG:  Other  abnormalities of gait and mobility  Unsteadiness on feet  Stiffness of left knee, not elsewhere classified  Acute pain of left knee  Rationale for Evaluation and Treatment: Rehabilitation  SUBJECTIVE:                                                                                                                                                                                             SUBJECTIVE STATEMENT: Just aches and pains of 83 year old arthritis. Denies falls   Pt accompanied by: self  PERTINENT HISTORY: HLD, gout, HTN, B hearing loss, R shoulder surgery; per pt- reports R ankle surgery   PAIN:  Are you having pain? Yes: NPRS  scale: 1/10 Pain location: L medial knee  Pain description: twinge  Aggravating factors: sitting on a low commode, throwing the leg up into bed  Relieving factors: repositioning   PRECAUTIONS: Fall Per Sports Medicine MD note on 08/06/23 he has been able to weight-bear and advance his activity.  He has had no displacement of fracture on his radiographs today.  He may continue to return to activity as tolerated and call us  if any issues in the coming month or 2 before he reaches a full recovery  RED FLAGS: None   WEIGHT BEARING RESTRICTIONS: No  FALLS: Has patient fallen in last 6 months? Yes. Number of falls 1  LIVING ENVIRONMENT: Lives with: lives alone; wife recently deceased but daughters come by to assist with chores  Lives in: House/apartment Stairs: ramp entry; 1 step from sunroom, 1 step into den Has following equipment at home: Single point cane, Environmental consultant - 2 wheeled, Environmental consultant - 4 wheeled, and Grab bars  PLOF: Independent and Vocation/Vocational requirements: pt enjoys golf  PATIENT GOALS: improve balance and pain   OBJECTIVE:     TODAY'S TREATMENT: 10/01/23 Activity Comments  nustep L5 x 6 min UEs/LEs  For LE strength and dynamic warmup   standing lateral wt shift Good performance  L lateral shift + R foot step on 8 stool Cueing required to maintain wt shift while tapping opposite foot; CGA-min A  fwd/back stepping  Heavy verbal cueing and demo to perform symmetrical step length; required 1 fingertip support with L LE stepping   standing 3 way hip 3# 20x each  Cueing to look ahead to maintain upright posture   Standing HS curl 3# 2x10 each LE Trunk flexed; c/o some stiffness on L LE  LAQ 3# 2x10 each  Good control             PATIENT EDUCATION: Education details: review of HEP; discussed patient's plans for DC- he reports plans to  attend [employer] classes after last session  Person educated: Patient Education method: Explanation Education comprehension:  verbalized understanding    Access Code: [email or web address] URL: [email or web address] Date: 09/11/2023 Prepared by: [facility] - Outpatient  Rehab - [facility]  Program Notes do not push into pain!  Exercises - Standing Gastroc Stretch at Counter  - 1 x daily - 5 x weekly - 2 sets - 10 reps - 30 sec  hold - Supine Heel Slide with Strap  - 1 x daily - 5 x weekly - 2 sets - 10 reps - Supine Active Straight Leg Raise  - 1 x daily - 5 x weekly - 2 sets - 10 reps - Sit to Stand Without Arm Support  - 1 x daily - 5 x weekly - 2 sets - 10 reps - Seated Long Arc Quad  - 1 x daily - 5 x weekly - 3 sets - 10 reps - Seated Ankle Dorsiflexion AROM  - 1-2 x daily - 7 x weekly - 3 sets - 10 reps - Supine Knee Extension Stretch on Towel Roll  - 1 x daily - 7 x weekly - 3 sets - 10 reps - 3 sec hold - Semi-Tandem Corner Balance With Eyes Open  - 1 x daily - 7 x weekly - 3 sets - 30 sec hold  ------------------------------------------- Note: Objective measures were completed at Evaluation unless otherwise noted.  DIAGNOSTIC FINDINGS: 08/06/23 R knee xray:   No acute displaced fracture or traumatic malalignment. Prior CT demonstrating a tibial plateau fracture is not available for comparison.  2.  Small joint effusion.  3.  Mild tricompartmental degenerative changes.   COGNITION: Overall cognitive status: Within functional limits for tasks assessed   SENSATION: Pt reports N/T in the L posterior calf   POSTURE: rounded shoulders and forward head  PALPATION: no TTP over knee, slightly sore over L calf and muscle restriction noted. No redness, warmth, and no calf swelling but mild edema evident over L medial knee  LOWER EXTREMITY ROM:     Active  Right Eval Left Eval  Hip flexion    Hip extension    Hip abduction    Hip adduction    Hip internal rotation    Hip external rotation    Knee flexion  105 *pain  Knee extension  9 deg *pain  Ankle dorsiflexion  10  Ankle  plantarflexion    Ankle inversion    Ankle eversion     (Blank rows = not tested)  LOWER EXTREMITY MMT:    MMT *in sitting Right Eval Left Eval  Hip flexion 5 5  Hip extension    Hip abduction 5 5  Hip adduction 4+ 4+  Hip internal rotation    Hip external rotation    Knee flexion 5 4  Knee extension 5 4  Ankle dorsiflexion 4+ 4  Ankle plantarflexion 4+ 4+  Ankle inversion    Ankle eversion    (Blank rows = not tested)   GAIT: Findings: Assistive device utilized:None, Level of assistance: Modified independence, and Comments: antalgic on L LE and R hip drop   FUNCTIONAL TESTS:  5 times sit to stand: 13.68 sec  10 meter walk test: 12.02 sec without AD (2.73 ft/sec)  GOALS: Goals reviewed with patient? Yes  SHORT TERM GOALS: Target date: 09/19/2023  Patient to be independent with initial HEP. Baseline: HEP initiated Goal status: NOT MET 09/25/2023    LONG TERM GOALS: Target date: 10/10/2023  Patient to be independent with advanced HEP. Baseline: Not yet initiated  Goal status: IN PROGRESS  Patient to demonstrate B LE strength >/=4+/5.  Baseline: See above Goal status: IN PROGRESS  Patient to demonstrate L knee AROM WFL and without pain limiting.  Baseline: 9-105 deg and painful  Goal status: IN PROGRESS  Patient to demonstrate symmetric step length and stance time with ambulation with LRAD. Baseline: analgic on L LE Goal status: IN PROGRESS  Patient to score at least 23/30 on FGA in order to decrease risk of falls.  Baseline: 20/30 09/11/2023 Goal status: IN PROGRESS  Patient to report tolerance for 30-60 minutes of weightbearing activity without L knee pain limiting.  Baseline: reports reduced activity tolerance  Goal status: IN PROGRESS   ASSESSMENT:  CLINICAL IMPRESSION: Patient arrived to session without new  complaints. Worked on weight shifting and increased use of L LE for standing tasks. Patient tolerated these activities well; required cueing to promote proper positioning and execution of exercises. LE strengthening activities required cues for posture. L knee flexion appears to still be a bit more limited than opposite side, thus encouraged pt to continue with HEP to address this. Plan for possible DC next session.    OBJECTIVE IMPAIRMENTS: Abnormal gait, decreased activity tolerance, decreased balance, decreased ROM, decreased strength, impaired flexibility, postural dysfunction, and pain.   ACTIVITY LIMITATIONS: carrying, lifting, bending, standing, squatting, stairs, transfers, bathing, toileting, dressing, reach over head, hygiene/grooming, and locomotion level  PARTICIPATION LIMITATIONS: meal prep, cleaning, laundry, shopping, community activity, yard work, and church  PERSONAL FACTORS: Age, Past/current experiences, Time since onset of injury/illness/exacerbation, and 3+ comorbidities: HLD, gout, HTN, B hearing loss, R shoulder surgery are also affecting patient's functional outcome.   REHAB POTENTIAL: Good  CLINICAL DECISION MAKING: Evolving/moderate complexity  EVALUATION COMPLEXITY: Moderate  PLAN:  PT FREQUENCY: 1-2x/week  PT DURATION: 6 weeks  PLANNED INTERVENTIONS: 97164- PT Re-evaluation, 97110-Therapeutic exercises, 97530- Therapeutic activity, 97112- Neuromuscular re-education, 97535- Self Care, [id number]- Manual therapy, [phone]- Gait training, [phone]- Canalith repositioning, [id number]- Aquatic Therapy, [phone] (1-2 muscles), [id number] (3+ muscles)- Dry Needling, Patient/Family education, Balance training, Stair training, Taping, Joint mobilization, Spinal mobilization, Vestibular training, DME instructions, Cryotherapy, and Moist heat  PLAN FOR NEXT SESSION: likely DC next session; Progress strength and balance;  work on increasing L stance to improve timing of gait     Louana Terrilyn Christians, PT, DPT 10/01/23 11:01 AM  [facility] [address], [address] [place], [place] [address] Phone # [phone] Fax # [phone]

## 2023-10-01 ENCOUNTER — Ambulatory Visit: Admitting: Physical Therapy

## 2023-10-01 ENCOUNTER — Encounter: Payer: Self-pay | Admitting: Physical Therapy

## 2023-10-01 DIAGNOSIS — R2689 Other abnormalities of gait and mobility: Secondary | ICD-10-CM

## 2023-10-01 DIAGNOSIS — R2681 Unsteadiness on feet: Secondary | ICD-10-CM

## 2023-10-01 DIAGNOSIS — M25562 Pain in left knee: Secondary | ICD-10-CM | POA: Diagnosis not present

## 2023-10-01 DIAGNOSIS — M25662 Stiffness of left knee, not elsewhere classified: Secondary | ICD-10-CM

## 2023-10-09 ENCOUNTER — Ambulatory Visit: Admitting: Physical Therapy

## 2023-10-09 ENCOUNTER — Encounter: Payer: Self-pay | Admitting: Physical Therapy

## 2023-10-09 DIAGNOSIS — R2689 Other abnormalities of gait and mobility: Secondary | ICD-10-CM | POA: Diagnosis not present

## 2023-10-09 DIAGNOSIS — M25562 Pain in left knee: Secondary | ICD-10-CM | POA: Diagnosis not present

## 2023-10-09 DIAGNOSIS — M25662 Stiffness of left knee, not elsewhere classified: Secondary | ICD-10-CM | POA: Diagnosis not present

## 2023-10-09 DIAGNOSIS — R2681 Unsteadiness on feet: Secondary | ICD-10-CM

## 2023-10-09 NOTE — Therapy (Signed)
 OUTPATIENT PHYSICAL THERAPY NEURO TREATMENT NOTE AND DISCHARGE  PHYSICAL THERAPY DISCHARGE SUMMARY  Visits from Start of Care: 6  Current functional level related to goals / functional outcomes: See below   Remaining deficits: See below   Education / Equipment: See below   Patient agrees to discharge. Patient goals were met. Patient is being discharged due to meeting the stated rehab goals.    Patient Name: Christopher Acevedo MRN: [id number] DOB:11-Oct-1940, 83 y.o., male Today's Date: 10/09/2023   PCP: Verena Mems, MD REFERRING PROVIDER: Verena Mems, MD  END OF SESSION:  PT End of Session - 10/09/23 1257     Visit Number 6    Number of Visits 13    Date for PT Re-Evaluation 10/10/23    Authorization Type [employer]    Authorization Time Period 8/6//25-11/27/23    Authorization - Visit Number 6    Authorization - Number of Visits 8    PT Start Time 1310    PT Stop Time 1350    PT Time Calculation (min) 40 min    Equipment Utilized During Treatment Gait belt    Activity Tolerance Patient tolerated treatment well    Behavior During Therapy WFL for tasks assessed/performed              Past Medical History:  Diagnosis Date   Actinic keratosis    DRY SKIN WITH LESIONS- MOSTLY ON FEET AND LOWER LEGS   Arthritis    Cancer (HCC)    nose basal cell carcinoma, neck melanoma   Chronic sinusitis    nasal polyp   Diverticulosis    LOWER ABD PAIN AND DIARRHEA - MOST RECENT EPISODE WAS 11/25/13- BETTER TODAY   Dry eyes    Elevated cholesterol    Environmental and seasonal allergies    GERD (gastroesophageal reflux disease)    Glaucoma    LEFT EYE   Gout    Hearing loss    BOTH EARS- HAS HEARING AIDS   Hypertension    Pre-diabetes    Sleep apnea    uses nightly   Spasm of eyelids    PT [facility]   Past Surgical History:  Procedure Laterality Date   BLEPHROPLASTY Bilateral     COLONOSCOPY W/ BIOPSIES AND POLYPECTOMY     CYST EXCISION Left 03/04/2020   Procedure: MUCOID CYST EXCISION WITH INTERPHALANGEAL JOINT DEBRIDEMENT;  Surgeon: Murrell Kuba, MD;  Location: [facility];  Service: Orthopedics;  Laterality: Left;  FOREARM BLOCK   HERNIA REPAIR  1990s?   NASAL SINUS SURGERY     POLYPECTOMY N/A 03/10/2016   Procedure: POLYPECTOMY NASAL;  Surgeon: Alm Bouche, MD;  Location: [facility] OR;  Service: ENT;  Laterality: N/A;   RIGHT SHOULDER SURGERY     RIGHT THUMB AND INDEX FINGER SURGERY FOR ARTHRITIS     SINUS ENDO WITH FUSION Bilateral 03/10/2016   Procedure: BILATERAL REVISION SINUS ENDOSCOPIC WITH FUSION;  Surgeon: Alm Bouche, MD;  Location: [facility] OR;  Service: ENT;  Laterality: Bilateral;   TRANSANAL HEMORRHOIDAL DEARTERIALIZATION N/A 12/02/2013   Procedure: TRANSANAL HEMORRHOIDAL DEARTERIALIZATION AND LIGATION/PEXY, EXTERNAL HEMORRHOIDECTOMY, REMOVAL OF GLUTEAL MASS;  Surgeon: Elspeth Schultze, MD;  Location: [facility];  Service: General;  Laterality: N/A;   Patient Active Problem List   Diagnosis Date Noted   AAA (abdominal aortic aneurysm) without rupture (HCC) 07/10/2015   Arthropathia 07/10/2015   Blepharospasm 07/10/2015   Arteriosclerosis of coronary artery 07/10/2015  Hemorrhoids without complication 07/10/2015   Essential (primary) hypertension 07/10/2015   Agnogenic myeloid metaplasia (HCC) 07/10/2015   Apnea, sleep 07/10/2015   Fatty infiltration of liver 07/10/2015   Nasal polyposis 02/16/2015   Perennial and seasonal allergic rhinitis 02/16/2015    ONSET DATE: July 08, 2023  REFERRING DIAG: R26.89 (ICD-10-CM) - Other abnormalities of gait and mobility  THERAPY DIAG:  Other abnormalities of gait and mobility  Unsteadiness on feet  Stiffness of left knee, not elsewhere classified  Acute pain of left knee  Rationale for Evaluation and Treatment: Rehabilitation  SUBJECTIVE:                                                                                                                                                                                              SUBJECTIVE STATEMENT: Pt states he's been going to [employer]. Feels ready to d/c from PT and try to do community wellness only so he does not have to keep paying his $25 copay. States medial knee is a little touchy today  Pt accompanied by: self  PERTINENT HISTORY: HLD, gout, HTN, B hearing loss, R shoulder surgery; per pt- reports R ankle surgery   PAIN:  Are you having pain? Yes: NPRS scale: 1/10 Pain location: L medial knee  Pain description: twinge  Aggravating factors: sitting on a low commode, throwing the leg up into bed  Relieving factors: repositioning   PRECAUTIONS: Fall Per Sports Medicine MD note on 08/06/23 he has been able to weight-bear and advance his activity.  He has had no displacement of fracture on his radiographs today.  He may continue to return to activity as tolerated and call us  if any issues in the coming month or 2 before he reaches a full recovery  RED FLAGS: None   WEIGHT BEARING RESTRICTIONS: No  FALLS: Has patient fallen in last 6 months? Yes. Number of falls 1  LIVING ENVIRONMENT: Lives with: lives alone; wife recently deceased but daughters come by to assist with chores  Lives in: House/apartment Stairs: ramp entry; 1 step from sunroom, 1 step into den Has following equipment at home: Single point cane, Environmental consultant - 2 wheeled, Environmental consultant - 4 wheeled, and Grab bars  PLOF: Independent and Vocation/Vocational requirements: pt enjoys golf  PATIENT GOALS: improve balance and pain   OBJECTIVE:     TODAY'S TREATMENT: 10/01/23 Activity Comments  nustep L5 x 6 min UEs/LEs  For LE strength and dynamic warmup   HEP review and update: Gastroc stretch 2x30 Standing 3 way hip 3# 2x10 Standing hamstring stretch x 30 Standing hamstring curl 3# 2x10 Good performance Cues to  keep quad tight for standing SLR with 3 way hip   Rechecked goals Cueing required to maintain wt shift while tapping opposite foot; CGA-min A  fwd/back stepping  Heavy verbal cueing and demo to perform symmetrical step length; required 1 fingertip support with L LE stepping                      PATIENT EDUCATION: Education details: review of HEP; discussed patient's plans for DC- he reports plans to attend [employer] classes after last session  Person educated: Patient Education method: Explanation Education comprehension: verbalized understanding    Access Code: [email or web address] URL: [email or web address] Date: 09/11/2023 Prepared by: [facility] - Outpatient  Rehab - [facility]  Program Notes do not push into pain!  Exercises - Standing Gastroc Stretch at Counter  - 1 x daily - 5 x weekly - 2 sets - 10 reps - 30 sec  hold - Supine Heel Slide with Strap  - 1 x daily - 5 x weekly - 2 sets - 10 reps - Supine Active Straight Leg Raise  - 1 x daily - 5 x weekly - 2 sets - 10 reps - Sit to Stand Without Arm Support  - 1 x daily - 5 x weekly - 2 sets - 10 reps - Seated Long Arc Quad  - 1 x daily - 5 x weekly - 3 sets - 10 reps - Seated Ankle Dorsiflexion AROM  - 1-2 x daily - 7 x weekly - 3 sets - 10 reps - Supine Knee Extension Stretch on Towel Roll  - 1 x daily - 7 x weekly - 3 sets - 10 reps - 3 sec hold - Semi-Tandem Corner Balance With Eyes Open  - 1 x daily - 7 x weekly - 3 sets - 30 sec hold  ------------------------------------------- Note: Objective measures were completed at Evaluation unless otherwise noted.  DIAGNOSTIC FINDINGS: 08/06/23 R knee xray:   No acute displaced fracture or traumatic malalignment. Prior CT demonstrating a tibial plateau fracture is not available for comparison.  2.  Small joint effusion.  3.  Mild tricompartmental degenerative changes.   COGNITION: Overall cognitive status: Within functional limits for tasks assessed   SENSATION: Pt reports N/T in the L posterior calf    POSTURE: rounded shoulders and forward head  PALPATION: no TTP over knee, slightly sore over L calf and muscle restriction noted. No redness, warmth, and no calf swelling but mild edema evident over L medial knee  LOWER EXTREMITY ROM:     Active  Right Eval Left Eval Left 10/09/23  Hip flexion     Hip extension     Hip abduction     Hip adduction     Hip internal rotation     Hip external rotation     Knee flexion  105 *pain 105  Knee extension  9 deg *pain 3  Ankle dorsiflexion  10   Ankle plantarflexion     Ankle inversion     Ankle eversion      (Blank rows = not tested)  LOWER EXTREMITY MMT:    MMT *in sitting Right Eval Left Eval R/L  Hip flexion 5 5 5/5  Hip extension     Hip abduction 5 5 5/5  Hip adduction 4+ 4+ 5/5  Hip internal rotation     Hip external rotation     Knee flexion 5 4 5/5  Knee extension 5 4 5/5  Ankle dorsiflexion 4+ 4 5/5  Ankle plantarflexion 4+ 4+ 5/5  Ankle inversion     Ankle eversion     (Blank rows = not tested)   GAIT: Findings: Assistive device utilized:None, Level of assistance: Modified independence, and Comments: antalgic on L LE and R hip drop   FUNCTIONAL TESTS:  5 times sit to stand: 13.68 sec  10 meter walk test: 12.02 sec without AD (2.73 ft/sec)   [facility] PT Assessment - 10/09/23 0001       Functional Gait  Assessment   Gait Level Surface Walks 20 ft in less than 7 sec but greater than 5.5 sec, uses assistive device, slower speed, mild gait deviations, or deviates 6-10 in outside of the 12 in walkway width.   10 meters in 13.53 sec   Change in Gait Speed Able to smoothly change walking speed without loss of balance or gait deviation. Deviate no more than 6 in outside of the 12 in walkway width.    Gait with Horizontal Head Turns Performs head turns smoothly with no change in gait. Deviates no more than 6 in outside 12 in walkway width    Gait with Vertical Head Turns Performs head turns with no change in gait.  Deviates no more than 6 in outside 12 in walkway width.    Gait and Pivot Turn Pivot turns safely within 3 sec and stops quickly with no loss of balance.    Step Over Obstacle Is able to step over one shoe box (4.5 in total height) without changing gait speed. No evidence of imbalance.    Gait with Narrow Base of Support Ambulates 4-7 steps.    Gait with Eyes Closed Walks 20 ft, uses assistive device, slower speed, mild gait deviations, deviates 6-10 in outside 12 in walkway width. Ambulates 20 ft in less than 9 sec but greater than 7 sec.    Ambulating Backwards Walks 20 ft, uses assistive device, slower speed, mild gait deviations, deviates 6-10 in outside 12 in walkway width.    Steps Alternating feet, must use rail.    Total Score 23           GOALS: Goals reviewed with patient? Yes  SHORT TERM GOALS: Target date: 09/19/2023  Patient to be independent with initial HEP. Baseline: HEP initiated Goal status: NOT MET 09/25/2023    LONG TERM GOALS: Target date: 10/10/2023  Patient to be independent with advanced HEP. Baseline: Not yet initiated  10/09/23: I've been doing some of it. Goal status: PARTIALLY MET  Patient to demonstrate B LE strength >/=4+/5.  Baseline: See above 10/09/23: 5/5 grossly Goal status: MET  Patient to demonstrate L knee AROM WFL and without pain limiting.  Baseline: 9-105 deg and painful  10/09/23: 3-105 deg little pain Goal status: MET  Patient to demonstrate symmetric step length and stance time with ambulation with LRAD. Baseline: analgic on L LE 10/09/23: L hip drop but increased step length Goal status: PARTIALLY MET  Patient to score at least 23/30 on FGA in order to decrease risk of falls.  Baseline: 20/30 09/11/2023 10/09/23: 23/30 no a/d Goal status: MET  Patient to report tolerance for 30-60 minutes of weightbearing activity without L knee pain limiting.  Baseline: reports reduced activity tolerance  10/09/23: Pt states he thinks he can  stand and walk for 30-60 min and walk without the cane Goal status: MET   ASSESSMENT:  CLINICAL IMPRESSION: Treatment focused on reviewing HEP and rechecking goals to finalize pt's d/c. Pt has met or partially met all  of his LTGs. Knee flexion ROM has not greatly increased but knee extension has improved. Overall strength with MMT has increased; however, still does not have quite enough strength to push his full body weight on one single limb to ascend stairs. Added to HEP to address this. In terms of balance, his has improved to goal level but remains challenged with tandem walking and narrow BOS. Mild antalgia noted due to some continued LE/hip weakness. At this point, pt verbalizes good understanding of his HEP and d/c instructions.    OBJECTIVE IMPAIRMENTS: Abnormal gait, decreased activity tolerance, decreased balance, decreased ROM, decreased strength, impaired flexibility, postural dysfunction, and pain.   ACTIVITY LIMITATIONS: carrying, lifting, bending, standing, squatting, stairs, transfers, bathing, toileting, dressing, reach over head, hygiene/grooming, and locomotion level  PARTICIPATION LIMITATIONS: meal prep, cleaning, laundry, shopping, community activity, yard work, and church  PERSONAL FACTORS: Age, Past/current experiences, Time since onset of injury/illness/exacerbation, and 3+ comorbidities: HLD, gout, HTN, B hearing loss, R shoulder surgery are also affecting patient's functional outcome.   REHAB POTENTIAL: Good  CLINICAL DECISION MAKING: Evolving/moderate complexity  EVALUATION COMPLEXITY: Moderate  PLAN:  PT FREQUENCY: 1-2x/week  PT DURATION: 6 weeks  PLANNED INTERVENTIONS: 97164- PT Re-evaluation, 97110-Therapeutic exercises, 97530- Therapeutic activity, 97112- Neuromuscular re-education, 97535- Self Care, 02859- Manual therapy, [phone]- Gait training, [phone]- Canalith repositioning, [id number]- Aquatic Therapy, [phone] (1-2 muscles), 20561 (3+ muscles)- Dry Needling,  Patient/Family education, Balance training, Stair training, Taping, Joint mobilization, Spinal mobilization, Vestibular training, DME instructions, Cryotherapy, and Moist heat  PLAN FOR NEXT SESSION: likely DC next session; Progress strength and balance;  work on increasing L stance to improve timing of gait    Mazella Deen April Ma L Alando Colleran, PT, DPT 10/09/23 1:03 PM  [facility] at [facility] [address], [address] [place], [place] [address] Phone # [phone] Fax # [phone]

## 2023-10-10 DIAGNOSIS — E1169 Type 2 diabetes mellitus with other specified complication: Secondary | ICD-10-CM | POA: Diagnosis not present

## 2023-10-10 DIAGNOSIS — I2510 Atherosclerotic heart disease of native coronary artery without angina pectoris: Secondary | ICD-10-CM | POA: Diagnosis not present

## 2023-10-10 DIAGNOSIS — E1142 Type 2 diabetes mellitus with diabetic polyneuropathy: Secondary | ICD-10-CM | POA: Diagnosis not present

## 2023-10-10 DIAGNOSIS — I10 Essential (primary) hypertension: Secondary | ICD-10-CM | POA: Diagnosis not present

## 2023-10-18 DIAGNOSIS — C44529 Squamous cell carcinoma of skin of other part of trunk: Secondary | ICD-10-CM | POA: Diagnosis not present

## 2023-10-18 DIAGNOSIS — L814 Other melanin hyperpigmentation: Secondary | ICD-10-CM | POA: Diagnosis not present

## 2023-10-18 DIAGNOSIS — Z85820 Personal history of malignant melanoma of skin: Secondary | ICD-10-CM | POA: Diagnosis not present

## 2023-10-18 DIAGNOSIS — L821 Other seborrheic keratosis: Secondary | ICD-10-CM | POA: Diagnosis not present

## 2023-10-18 DIAGNOSIS — L905 Scar conditions and fibrosis of skin: Secondary | ICD-10-CM | POA: Diagnosis not present

## 2023-10-18 DIAGNOSIS — D485 Neoplasm of uncertain behavior of skin: Secondary | ICD-10-CM | POA: Diagnosis not present

## 2023-10-18 DIAGNOSIS — Z08 Encounter for follow-up examination after completed treatment for malignant neoplasm: Secondary | ICD-10-CM | POA: Diagnosis not present

## 2023-10-23 DIAGNOSIS — E1142 Type 2 diabetes mellitus with diabetic polyneuropathy: Secondary | ICD-10-CM | POA: Diagnosis not present

## 2023-10-23 DIAGNOSIS — E1169 Type 2 diabetes mellitus with other specified complication: Secondary | ICD-10-CM | POA: Diagnosis not present

## 2023-10-23 DIAGNOSIS — I10 Essential (primary) hypertension: Secondary | ICD-10-CM | POA: Diagnosis not present

## 2023-10-23 DIAGNOSIS — F324 Major depressive disorder, single episode, in partial remission: Secondary | ICD-10-CM | POA: Diagnosis not present

## 2023-10-23 DIAGNOSIS — I2510 Atherosclerotic heart disease of native coronary artery without angina pectoris: Secondary | ICD-10-CM | POA: Diagnosis not present

## 2023-10-23 DIAGNOSIS — E785 Hyperlipidemia, unspecified: Secondary | ICD-10-CM | POA: Diagnosis not present

## 2023-11-02 ENCOUNTER — Observation Stay (HOSPITAL_BASED_OUTPATIENT_CLINIC_OR_DEPARTMENT_OTHER)
Admission: EM | Admit: 2023-11-02 | Discharge: 2023-11-04 | Disposition: A | Discharge status: Home / Self Care | Source: Ambulatory Visit | Attending: Internal Medicine | Admitting: Internal Medicine

## 2023-11-02 ENCOUNTER — Encounter (HOSPITAL_BASED_OUTPATIENT_CLINIC_OR_DEPARTMENT_OTHER): Payer: Self-pay

## 2023-11-02 ENCOUNTER — Other Ambulatory Visit: Payer: Self-pay

## 2023-11-02 ENCOUNTER — Emergency Department (HOSPITAL_BASED_OUTPATIENT_CLINIC_OR_DEPARTMENT_OTHER)

## 2023-11-02 DIAGNOSIS — G9389 Other specified disorders of brain: Secondary | ICD-10-CM | POA: Diagnosis not present

## 2023-11-02 DIAGNOSIS — Z79899 Other long term (current) drug therapy: Secondary | ICD-10-CM | POA: Insufficient documentation

## 2023-11-02 DIAGNOSIS — I959 Hypotension, unspecified: Secondary | ICD-10-CM | POA: Diagnosis not present

## 2023-11-02 DIAGNOSIS — Z87891 Personal history of nicotine dependence: Secondary | ICD-10-CM | POA: Insufficient documentation

## 2023-11-02 DIAGNOSIS — Z7982 Long term (current) use of aspirin: Secondary | ICD-10-CM | POA: Insufficient documentation

## 2023-11-02 DIAGNOSIS — I6782 Cerebral ischemia: Secondary | ICD-10-CM | POA: Diagnosis not present

## 2023-11-02 DIAGNOSIS — I714 Abdominal aortic aneurysm, without rupture, unspecified: Secondary | ICD-10-CM | POA: Diagnosis not present

## 2023-11-02 DIAGNOSIS — F1090 Alcohol use, unspecified, uncomplicated: Secondary | ICD-10-CM | POA: Insufficient documentation

## 2023-11-02 DIAGNOSIS — R55 Syncope and collapse: Principal | ICD-10-CM | POA: Insufficient documentation

## 2023-11-02 DIAGNOSIS — N400 Enlarged prostate without lower urinary tract symptoms: Secondary | ICD-10-CM | POA: Diagnosis not present

## 2023-11-02 DIAGNOSIS — R5381 Other malaise: Secondary | ICD-10-CM | POA: Diagnosis not present

## 2023-11-02 DIAGNOSIS — I10 Essential (primary) hypertension: Secondary | ICD-10-CM | POA: Diagnosis present

## 2023-11-02 DIAGNOSIS — E785 Hyperlipidemia, unspecified: Secondary | ICD-10-CM | POA: Diagnosis not present

## 2023-11-02 DIAGNOSIS — I119 Hypertensive heart disease without heart failure: Secondary | ICD-10-CM | POA: Insufficient documentation

## 2023-11-02 LAB — D-DIMER, QUANTITATIVE: D-Dimer, Quant: 1.09 ug{FEU}/mL — ABNORMAL HIGH (ref 0.00–0.50)

## 2023-11-02 LAB — CBC
HCT: 37.5 % — ABNORMAL LOW (ref 39.0–52.0)
Hemoglobin: 12.9 g/dL — ABNORMAL LOW (ref 13.0–17.0)
MCH: 31.9 pg (ref 26.0–34.0)
MCHC: 34.4 g/dL (ref 30.0–36.0)
MCV: 92.8 fL (ref 80.0–100.0)
Platelets: 181 K/uL (ref 150–400)
RBC: 4.04 MIL/uL — ABNORMAL LOW (ref 4.22–5.81)
RDW: 12.3 % (ref 11.5–15.5)
WBC: 9 K/uL (ref 4.0–10.5)
nRBC: 0 % (ref 0.0–0.2)

## 2023-11-02 LAB — COMPREHENSIVE METABOLIC PANEL WITH GFR
ALT: 15 U/L (ref 0–44)
AST: 25 U/L (ref 15–41)
Albumin: 4 g/dL (ref 3.5–5.0)
Alkaline Phosphatase: 72 U/L (ref 38–126)
Anion gap: 10 (ref 5–15)
BUN: 16 mg/dL (ref 8–23)
CO2: 28 mmol/L (ref 22–32)
Calcium: 9.7 mg/dL (ref 8.9–10.3)
Chloride: 103 mmol/L (ref 98–111)
Creatinine, Ser: 0.97 mg/dL (ref 0.61–1.24)
GFR, Estimated: >60 mL/min (ref >60)
Glucose, Bld: 89 mg/dL (ref 70–99)
Potassium: 4.2 mmol/L (ref 3.5–5.1)
Sodium: 141 mmol/L (ref 135–145)
Total Bilirubin: 0.6 mg/dL (ref 0.0–1.2)
Total Protein: 6.2 g/dL — ABNORMAL LOW (ref 6.5–8.1)

## 2023-11-02 LAB — TROPONIN T, HIGH SENSITIVITY
Troponin T High Sensitivity: 26 ng/L — ABNORMAL HIGH (ref 0–19)
Troponin T High Sensitivity: 23 ng/L — ABNORMAL HIGH (ref 0–19)

## 2023-11-02 LAB — URINALYSIS, ROUTINE W REFLEX MICROSCOPIC
Bilirubin Urine: NEGATIVE
Glucose, UA: NEGATIVE mg/dL
Hgb urine dipstick: NEGATIVE
Ketones, ur: NEGATIVE mg/dL
Leukocytes,Ua: NEGATIVE
Nitrite: NEGATIVE
Protein, ur: NEGATIVE mg/dL
Specific Gravity, Urine: 1.022 (ref 1.005–1.030)
pH: 6 (ref 5.0–8.0)

## 2023-11-02 LAB — CBG MONITORING, ED: Glucose-Capillary: 124 mg/dL — ABNORMAL HIGH (ref 70–99)

## 2023-11-02 MED ORDER — LACTATED RINGERS IV BOLUS
500.0000 mL | Freq: Once | INTRAVENOUS | Status: AC
  Administered 2023-11-02: 500 mL via INTRAVENOUS

## 2023-11-02 NOTE — Progress Notes (Signed)
 CT head is neg, will admit this ptn to med tele.  D dimer ordered   Marvie Brevik

## 2023-11-02 NOTE — ED Notes (Signed)
 PT had no complaints while getting orthostatic vital signs despite drop in blood pressure. Appeared steady on feet and stated no dizziness or trouble standing when asked.

## 2023-11-02 NOTE — Progress Notes (Signed)
 Discussed case with EDP regarding admitting this patient for syncope/LOC. At this time etiology is somewhat unclear. This could very well be vasovagal or dehydration. But I have advised it would appropriate to first obtain CT Head to rule out any major intracranial pathology.  Please notify [facility] once the scan has resulted.   Christopher Acevedo

## 2023-11-02 NOTE — ED Triage Notes (Signed)
 Patient arrives from [facility]. He said he passed out yesterday so he went be evaluated today. While he was there his BP was 82/47. He is alert, oriented, and ambulatory here. He thinks it may have been from overuse of laxatives as he has been constipated. They recommend that he be seen here. He is asymptomatic at this time.

## 2023-11-02 NOTE — ED Provider Notes (Signed)
 [facility] DEPARTMENT AT [facility] Provider Note   CSN: [id number] Arrival date & time: 11/02/23  1115     Patient presents with: Hypotension and Loss of Consciousness   Christopher Acevedo is a 83 y.o. male.   HPI 83 year old male presents today complaining of hypertension and loss of consciousness yesterday.  Patient states that he was at his friend's house.  He reports that he had been constipated and took several laxatives.  He states that he then had a hard bowel movement followed by loose stool.  He was sitting outside with his friends in the sun and it was hot.  He stood up and had a episode where he passed out.  He reports that it was momentary he was able to get back up.  He denies any injury.  He states that his friends encouraged him to go to the doctor.  He reports that he made an appointment was seen at his primary care [facility] today.  He was told his blood pressure was low at 82 and to come straight to the emergency department.  He denies any current headache, head injury, chest pain, dyspnea, abdominal pain, nausea, vomiting, or more bowel movements except as mentioned above.  He denies any urinary tract infection symptoms, fever, chills.  He reports taking his home medications as prescribed.     Prior to Admission medications   Medication Sig Start Date End Date Taking? Authorizing Provider  aspirin EC 81 MG tablet Take 81 mg by mouth daily. Swallow whole.    [provider]  atorvastatin (LIPITOR) 40 MG tablet Take 40 mg by mouth daily at 6 PM. Takes 1/2 tablet daily    [provider]  cholecalciferol (VITAMIN D) 1000 UNITS tablet Take 1,000 Units by mouth every morning. Reported on 07/08/2015    [provider]  cycloSPORINE (RESTASIS) 0.05 % ophthalmic emulsion Place 1 drop into both eyes 2 (two) times daily.    [provider]  MegaRed Omega-3 Krill Oil 500 MG CAPS Take by mouth.    [provider]  oxyCODONE   (OXY IR/ROXICODONE ) 5 MG immediate release tablet Take 0.5-1 tablets (2.5-5 mg total) by mouth every 6 (six) hours as needed for severe pain (pain score 7-10). Patient not taking: Reported on 08/29/2023 07/09/23   Desiderio Chew, PA-C  Turmeric 450 MG CAPS Take by mouth.    [provider]    Allergies: Codeine    Review of Systems  Updated Vital Signs BP 130/76   Pulse 65   Resp (!) 24   SpO2 100%   Physical Exam Vitals and nursing note reviewed.  Constitutional:      Appearance: Normal appearance.  HENT:     Head: Normocephalic and atraumatic.     Right Ear: External ear normal.     Left Ear: External ear normal.     Nose: Nose normal.     Mouth/Throat:     Pharynx: Oropharynx is clear.  Eyes:     Pupils: Pupils are equal, round, and reactive to light.  Cardiovascular:     Rate and Rhythm: Normal rate and regular rhythm.     Pulses: Normal pulses.     Heart sounds: Normal heart sounds.  Pulmonary:     Effort: Pulmonary effort is normal.  Abdominal:     General: Abdomen is flat. Bowel sounds are normal.     Palpations: Abdomen is soft.  Musculoskeletal:        General: Normal range  of motion.     Cervical back: Normal range of motion.  Skin:    General: Skin is warm and dry.     Capillary Refill: Capillary refill takes less than 2 seconds.  Neurological:     General: No focal deficit present.     Mental Status: He is alert.  Psychiatric:        Mood and Affect: Mood normal.     (all labs ordered are listed, but only abnormal results are displayed) Labs Reviewed  COMPREHENSIVE METABOLIC PANEL WITH GFR - Abnormal; Notable for the following components:      Result Value   Total Protein 6.2 (*)    All other components within normal limits  CBC - Abnormal; Notable for the following components:   RBC 4.04 (*)    Hemoglobin 12.9 (*)    HCT 37.5 (*)    All other components within normal limits  CBG MONITORING, ED - Abnormal; Notable for the following  components:   Glucose-Capillary 124 (*)    All other components within normal limits  TROPONIN T, HIGH SENSITIVITY - Abnormal; Notable for the following components:   Troponin T High Sensitivity 26 (*)    All other components within normal limits  TROPONIN T, HIGH SENSITIVITY - Abnormal; Notable for the following components:   Troponin T High Sensitivity 23 (*)    All other components within normal limits  URINALYSIS, ROUTINE W REFLEX MICROSCOPIC    EKG: EKG Interpretation Date/Time:  Friday November 02 2023 11:25:59 EDT Ventricular Rate:  66 PR Interval:  195 QRS Duration:  151 QT Interval:  462 QTC Calculation: 485 R Axis:   -40  Text Interpretation: Sinus rhythm RBBB and LAFB Confirmed by Levander Houston [phone]) on 11/02/2023 11:27:40 AM  Radiology: No results found.   .Critical Care  Performed by: Levander Houston, MD Authorized by: Levander Houston, MD   Critical care provider statement:    Critical care time (minutes):  30   Critical care end time:  11/02/2023 3:48 PM   Critical care time was exclusive of:  Separately billable procedures and treating other patients and teaching time   Critical care was time spent personally by me on the following activities:  Development of treatment plan with patient or surrogate, discussions with consultants, evaluation of patient's response to treatment, examination of patient, ordering and review of laboratory studies, ordering and review of radiographic studies, ordering and performing treatments and interventions, pulse oximetry, re-evaluation of patient's condition and review of old charts    Medications Ordered in the ED  lactated ringers  bolus 500 mL (0 mLs Intravenous Stopped 11/02/23 1242)  lactated ringers  bolus 500 mL (500 mLs Intravenous New Bag/Given 11/02/23 1522)                                    Medical Decision Making Amount and/or Complexity of Data Reviewed Labs: ordered. Radiology: ordered.   Patient presents  today with 2 episodes of hypotension.  First episode yesterday occurred while he was sitting with friends and stood up.  There is appeared to be no significant prodrome.  He recovered without intervention.  Patient went to see his doctor today.  Patient reported to have blood pressure at 82/47 when he was seen at [facility] physicians today.  Subsequently, he was sent to the ED.  Here he has variable blood pressures ranging from 99-1 34.  Orthostatics were obtained and blood  pressures did decrease although heart rate remained stable. Patient was evaluated with EKG that shows sinus rhythm with right bundle branch block and left anterior fascicular block.  Patient is not complaining of any chest pain. He denies any chest pain yesterday Patient has elevated troponins although they have trended flat.  Otherwise patient's labs are essentially normal.  He has not required any interventions here.  He has been maintained on monitor. Plan transfer to inpatient team for ongoing evaluation and treatment. Care discussed with Dr. Caleen.  They request CT head.  CT head has been ordered. Dr. Doretha aware of patient    Final diagnoses:  Hypotension, unspecified hypotension type    ED Discharge Orders     None          Levander Houston, MD 11/02/23 [phone]

## 2023-11-03 ENCOUNTER — Observation Stay (HOSPITAL_COMMUNITY)

## 2023-11-03 DIAGNOSIS — I959 Hypotension, unspecified: Secondary | ICD-10-CM

## 2023-11-03 DIAGNOSIS — R079 Chest pain, unspecified: Secondary | ICD-10-CM | POA: Diagnosis not present

## 2023-11-03 DIAGNOSIS — I10 Essential (primary) hypertension: Secondary | ICD-10-CM | POA: Diagnosis not present

## 2023-11-03 DIAGNOSIS — R55 Syncope and collapse: Secondary | ICD-10-CM

## 2023-11-03 DIAGNOSIS — R7989 Other specified abnormal findings of blood chemistry: Secondary | ICD-10-CM | POA: Diagnosis not present

## 2023-11-03 DIAGNOSIS — N400 Enlarged prostate without lower urinary tract symptoms: Secondary | ICD-10-CM | POA: Diagnosis present

## 2023-11-03 LAB — BASIC METABOLIC PANEL WITH GFR
Anion gap: 9 (ref 5–15)
BUN: 11 mg/dL (ref 8–23)
CO2: 28 mmol/L (ref 22–32)
Calcium: 8.7 mg/dL — ABNORMAL LOW (ref 8.9–10.3)
Chloride: 101 mmol/L (ref 98–111)
Creatinine, Ser: 0.79 mg/dL (ref 0.61–1.24)
GFR, Estimated: >60 mL/min (ref >60)
Glucose, Bld: 95 mg/dL (ref 70–99)
Potassium: 4.2 mmol/L (ref 3.5–5.1)
Sodium: 138 mmol/L (ref 135–145)

## 2023-11-03 LAB — CBC
HCT: 37.5 % — ABNORMAL LOW (ref 39.0–52.0)
Hemoglobin: 13 g/dL (ref 13.0–17.0)
MCH: 32 pg (ref 26.0–34.0)
MCHC: 34.7 g/dL (ref 30.0–36.0)
MCV: 92.4 fL (ref 80.0–100.0)
Platelets: 169 K/uL (ref 150–400)
RBC: 4.06 MIL/uL — ABNORMAL LOW (ref 4.22–5.81)
RDW: 12.2 % (ref 11.5–15.5)
WBC: 7.5 K/uL (ref 4.0–10.5)
nRBC: 0 % (ref 0.0–0.2)

## 2023-11-03 LAB — ECHOCARDIOGRAM COMPLETE
AR max vel: 2.94 cm2
AV Area VTI: 3.03 cm2
AV Area mean vel: 3.1 cm2
AV Mean grad: 4 mmHg
AV Peak grad: 8.3 mmHg
Ao pk vel: 1.44 m/s
Area-P 1/2: 2.62 cm2
S' Lateral: 2.9 cm

## 2023-11-03 MED ORDER — IOHEXOL 350 MG/ML SOLN
75.0000 mL | Freq: Once | INTRAVENOUS | Status: AC | PRN
  Administered 2023-11-03: 75 mL via INTRAVENOUS

## 2023-11-03 MED ORDER — ENOXAPARIN SODIUM 40 MG/0.4ML IJ SOSY
40.0000 mg | PREFILLED_SYRINGE | INTRAMUSCULAR | Status: DC
  Administered 2023-11-03: 40 mg via SUBCUTANEOUS
  Filled 2023-11-03: qty 0.4

## 2023-11-03 MED ORDER — POLYETHYLENE GLYCOL 3350 17 G PO PACK
17.0000 g | PACK | Freq: Every day | ORAL | Status: DC
  Administered 2023-11-03: 17 g via ORAL
  Filled 2023-11-03: qty 1

## 2023-11-03 MED ORDER — ACETAMINOPHEN 325 MG PO TABS: 650.0000 mg | ORAL_TABLET | Freq: Four times a day (QID) | ORAL | Status: DC | PRN

## 2023-11-03 MED ORDER — ALBUTEROL SULFATE (2.5 MG/3ML) 0.083% IN NEBU: 2.5000 mg | INHALATION_SOLUTION | RESPIRATORY_TRACT | Status: DC | PRN

## 2023-11-03 MED ORDER — ONDANSETRON HCL 4 MG/2ML IJ SOLN
4.0000 mg | Freq: Four times a day (QID) | INTRAMUSCULAR | Status: DC | PRN
Start: 2023-11-03 — End: 2023-11-04

## 2023-11-03 MED ORDER — TAMSULOSIN HCL 0.4 MG PO CAPS: 0.4000 mg | ORAL_CAPSULE | Freq: Every day | ORAL | Status: DC

## 2023-11-03 MED ORDER — ATORVASTATIN CALCIUM 40 MG PO TABS
40.0000 mg | ORAL_TABLET | Freq: Every day | ORAL | Status: DC
  Administered 2023-11-03: 40 mg via ORAL
  Filled 2023-11-03: qty 1

## 2023-11-03 MED ORDER — VITAMIN D 25 MCG (1000 UNIT) PO TABS
1000.0000 [IU] | ORAL_TABLET | Freq: Every morning | ORAL | Status: DC
  Administered 2023-11-03 – 2023-11-04 (×2): 1000 [IU] via ORAL
  Filled 2023-11-03 (×2): qty 1

## 2023-11-03 MED ORDER — ACETAMINOPHEN 650 MG RE SUPP: 650.0000 mg | Freq: Four times a day (QID) | RECTAL | Status: DC | PRN

## 2023-11-03 MED ORDER — ASPIRIN 81 MG PO TBEC
81.0000 mg | DELAYED_RELEASE_TABLET | Freq: Every day | ORAL | Status: DC
  Administered 2023-11-03 – 2023-11-04 (×2): 81 mg via ORAL
  Filled 2023-11-03 (×2): qty 1

## 2023-11-03 MED ORDER — CYCLOSPORINE 0.05 % OP EMUL
1.0000 [drp] | Freq: Two times a day (BID) | OPHTHALMIC | Status: DC
  Administered 2023-11-03: 1 [drp] via OPHTHALMIC
  Filled 2023-11-03 (×3): qty 30

## 2023-11-03 MED ORDER — FINASTERIDE 5 MG PO TABS
5.0000 mg | ORAL_TABLET | Freq: Every day | ORAL | Status: DC
  Administered 2023-11-03 – 2023-11-04 (×2): 5 mg via ORAL
  Filled 2023-11-03 (×2): qty 1

## 2023-11-03 MED ORDER — ONDANSETRON HCL 4 MG PO TABS: 4.0000 mg | ORAL_TABLET | Freq: Four times a day (QID) | ORAL | Status: DC | PRN

## 2023-11-03 MED ORDER — LACTATED RINGERS IV SOLN: INTRAVENOUS | Status: AC

## 2023-11-03 NOTE — Care Management Obs Status (Signed)
 MEDICARE OBSERVATION STATUS NOTIFICATION   Patient Details  Name: Christopher Acevedo MRN: [id number] Date of Birth: April 13, 1940   Medicare Observation Status Notification Given:  Yes    Marval Gell, RN 11/03/2023, 3:17 PM

## 2023-11-03 NOTE — Plan of Care (Addendum)
 Patient has been transferred from [facility] to [facility] for evaluation for syncope in the setting of hypotension.  Per chart review of the ED physician and accepting [facility] hospitalist Dr. Burgess documentation CT head obtained which is unremarkable however D-dimer has been ordered which is elevated. - In the setting of elevated D-dimer and syncopal episode obtaining CTA chest to rule out PE. -Please read accepting physician transfer note for detailed information.  Ali Mohl, MD [facility] 11/03/2023, 2:05 AM

## 2023-11-03 NOTE — Progress Notes (Signed)
 Echocardiogram 2D Echocardiogram has been performed.  Damien FALCON Ermal Haberer RDCS 11/03/2023, 4:10 PM

## 2023-11-03 NOTE — H&P (Signed)
 History and Physical    Patient: Christopher CHAIKIN FMW:[id number] DOB: Jan 03, 1941 DOA: 11/02/2023 DOS: the patient was seen and examined on 11/03/2023 PCP: Kip Righter, MD  Patient coming from: Home  Chief Complaint:  Chief Complaint  Patient presents with   Hypotension   Loss of Consciousness   HPI: Christopher Acevedo is a 83 y.o. male with medical history significant of HLD, BPH-who presented to the ED for evaluation of a syncopal episode.  Per patient-he occasionally gets lightheaded episodes-this has been ongoing for almost 1 year.  For the past week or so-he has had constipation-and has given himself numerous laxatives/Dulcolax and attempt to have a bowel movement-and has had numerous profuse bowel movements since then.  1 day prior to this hospitalization-patient was sitting with a friend (who is disabled)-patient got up to arrange chair so that his friend would be able to sit/move.  However as soon as he got up-he felt lightheaded/dizzy-and then sustained a brief episode of loss of consciousness.  There was no tongue bite, urinary or fecal incontinence.  He apparently went to see his primary care practitioner-his blood pressure during that visit was in the 80s systolic.  He he was referred to the ED-where he had positive orthostatic vital signs.  He was given IV fluid, and subsequently referred to the hospitalist service for further evaluation and treatment.  No prior syncopal episodes in the past No headache No chest pain No shortness of breath No fever No nausea, vomiting No abdominal pain No hematuria/dysuria.  Review of Systems: As mentioned in the history of present illness. All other systems reviewed and are negative. Past Medical History:  Diagnosis Date   Actinic keratosis    DRY SKIN WITH LESIONS- MOSTLY ON FEET AND LOWER LEGS   Arthritis    Cancer (HCC)    nose basal cell carcinoma, neck melanoma   Chronic sinusitis    nasal polyp   Diverticulosis    LOWER ABD  PAIN AND DIARRHEA - MOST RECENT EPISODE WAS 11/25/13- BETTER TODAY   Dry eyes    Elevated cholesterol    Environmental and seasonal allergies    GERD (gastroesophageal reflux disease)    Glaucoma    LEFT EYE   Gout    Hearing loss    BOTH EARS- HAS HEARING AIDS   Hypertension    Pre-diabetes    Sleep apnea    uses nightly   Spasm of eyelids    PT [facility]   Past Surgical History:  Procedure Laterality Date   BLEPHROPLASTY Bilateral    COLONOSCOPY W/ BIOPSIES AND POLYPECTOMY     CYST EXCISION Left 03/04/2020   Procedure: MUCOID CYST EXCISION WITH INTERPHALANGEAL JOINT DEBRIDEMENT;  Surgeon: Murrell Kuba, MD;  Location: [facility];  Service: Orthopedics;  Laterality: Left;  FOREARM BLOCK   HERNIA REPAIR  1990s?   NASAL SINUS SURGERY     POLYPECTOMY N/A 03/10/2016   Procedure: POLYPECTOMY NASAL;  Surgeon: Alm Bouche, MD;  [facility] OR;  Service: ENT;  Laterality: N/A;   RIGHT SHOULDER SURGERY     RIGHT THUMB AND INDEX FINGER SURGERY FOR ARTHRITIS     SINUS ENDO WITH FUSION Bilateral 03/10/2016   Procedure: BILATERAL REVISION SINUS ENDOSCOPIC WITH FUSION;  Surgeon: Alm Bouche, MD;  Location: [facility] OR;  Service: ENT;  Laterality: Bilateral;   TRANSANAL HEMORRHOIDAL DEARTERIALIZATION N/A 12/02/2013   Procedure: TRANSANAL HEMORRHOIDAL DEARTERIALIZATION AND LIGATION/PEXY, EXTERNAL HEMORRHOIDECTOMY,  REMOVAL OF GLUTEAL MASS;  Surgeon: Elspeth Schultze, MD;  Location: [facility];  Service: General;  Laterality: N/A;   Social History:  reports that he quit smoking about 19 years ago. His smoking use included cigarettes. He started smoking about 54 years ago. He has a 35 pack-year smoking history. He has quit using smokeless tobacco.  His smokeless tobacco use included snuff and chew. He reports current alcohol use of about 4.0 standard drinks of alcohol per week. He reports that he does not use drugs.  Allergies   Allergen Reactions   Codeine Nausea And Vomiting    FELT FAINT    Family History  Problem Relation Age of Onset   Rheum arthritis Mother    Allergic rhinitis Neg Hx    Angioedema Neg Hx    Asthma Neg Hx    Atopy Neg Hx    Eczema Neg Hx    Immunodeficiency Neg Hx    Urticaria Neg Hx     Prior to Admission medications   Medication Sig Start Date End Date Taking? Authorizing Provider  aspirin EC 81 MG tablet Take 81 mg by mouth daily. Swallow whole.    [provider]  atorvastatin (LIPITOR) 40 MG tablet Take 40 mg by mouth daily at 6 PM. Takes 1/2 tablet daily    [provider]  cholecalciferol (VITAMIN D) 1000 UNITS tablet Take 1,000 Units by mouth every morning. Reported on 07/08/2015    [provider]  co-enzyme Q-10 30 MG capsule Take 30 mg by mouth daily.    [provider]  cycloSPORINE (RESTASIS) 0.05 % ophthalmic emulsion Place 1 drop into both eyes 2 (two) times daily.    [provider]  MegaRed Omega-3 Krill Oil 500 MG CAPS Take by mouth.    [provider]  oxyCODONE  (OXY IR/ROXICODONE ) 5 MG immediate release tablet Take 0.5-1 tablets (2.5-5 mg total) by mouth every 6 (six) hours as needed for severe pain (pain score 7-10). Patient not taking: Reported on 08/29/2023 07/09/23   Geiple, Joshua, PA-C  tamsulosin (FLOMAX) 0.4 MG CAPS capsule Take 0.4 mg by mouth daily.    [provider]  Turmeric 450 MG CAPS Take by mouth.    [provider]    Physical Exam: Vitals:   11/03/23 0000 11/03/23 0130 11/03/23 0500 11/03/23 0717  BP: 121/79 132/77 134/84 131/86  Pulse: 61 68  65  Resp: 17  17 (!) 22  Temp: 98.2 F (36.8 C)   (!) 97.4 F (36.3 C)  TempSrc: Oral   Oral  SpO2: 99% 96% 96% (!) 86%      Latest Ref Rng & Units 11/03/2023    5:46 AM 11/02/2023   11:25 AM 09/04/2023    1:22 PM  CBC  WBC 4.0 - 10.5 K/uL 7.5  9.0  6.2   Hemoglobin 13.0 - 17.0 g/dL 86.9  87.0  87.3   Hematocrit 39.0 -  52.0 % 37.5  37.5  36.8   Platelets 150 - 400 K/uL 169  181  198         Latest Ref Rng & Units 11/03/2023    5:46 AM 11/02/2023   11:25 AM 09/04/2023    1:22 PM  BMP  Glucose 70 - 99 mg/dL 95  89  91   BUN 8 - 23 mg/dL 11  16  15    Creatinine 0.61 - 1.24 mg/dL 9.20  9.02  9.12   Sodium 135 - 145 mmol/L 138  141  138   Potassium 3.5 - 5.1 mmol/L 4.2  4.2  4.0   Chloride 98 - 111 mmol/L 101  103  101   CO2 22 - 32 mmol/L 28  28  28    Calcium 8.9 - 10.3 mg/dL 8.7  9.7  9.8     CT head: No acute intracranial abnormality CTA chest: No PE  Twelve-lead EKG: Normal sinus rhythm  Data Reviewed:    Latest Ref Rng & Units 11/03/2023    5:46 AM 11/02/2023   11:25 AM 09/04/2023    1:22 PM  CBC  WBC 4.0 - 10.5 K/uL 7.5  9.0  6.2   Hemoglobin 13.0 - 17.0 g/dL 86.9  87.0  87.3   Hematocrit 39.0 - 52.0 % 37.5  37.5  36.8   Platelets 150 - 400 K/uL 169  181  198       Latest Ref Rng & Units 11/03/2023    5:46 AM 11/02/2023   11:25 AM 09/04/2023    1:22 PM  BMP  Glucose 70 - 99 mg/dL 95  89  91   BUN 8 - 23 mg/dL 11  16  15    Creatinine 0.61 - 1.24 mg/dL 9.20  9.02  9.12   Sodium 135 - 145 mmol/L 138  141  138   Potassium 3.5 - 5.1 mmol/L 4.2  4.2  4.0   Chloride 98 - 111 mmol/L 101  103  101   CO2 22 - 32 mmol/L 28  28  28    Calcium 8.9 - 10.3 mg/dL 8.7  9.7  9.8     Assessment and Plan: Syncope With prodrome of lightheadedness prior to syncope-hypotensive at PCPs office-orthostatic vital signs positive in the emergency room-all point to orthostatic hypotension has the etiology.  Suspect this occurred in the setting of bowel purge-and use of Flomax (apparently takes 0.8 mg in the morning) Gently hydrate Telemetry monitoring Echocardiogram Please compression stockings. Stopping Flomax-see below Ambulate with nursing staff/PT/OT and see how he does  BPH Has had issues with intermittent lightheadedness-which are exclusively postural for the past year. Given syncope-high  suspicion that this is all related to  orthostatic hypotension-Will stop Flomax and switch him to finasteride.  HLD Continue statin   Advance Care Planning:   Code Status: Full Code   Consults: None  Family Communication: None at bedside  Severity of Illness: The appropriate patient status for this patient is OBSERVATION. Observation status is judged to be reasonable and necessary in order to provide the required intensity of service to ensure the patient's safety. The patient's presenting symptoms, physical exam findings, and initial radiographic and laboratory data in the context of their medical condition is felt to place them at decreased risk for further clinical deterioration. Furthermore, it is anticipated that the patient will be medically stable for discharge from the hospital within 2 midnights of admission.   Author: Donalda Applebaum, MD 11/03/2023 7:41 AM  For on call review [email or web address].

## 2023-11-04 ENCOUNTER — Other Ambulatory Visit (HOSPITAL_COMMUNITY): Payer: Self-pay

## 2023-11-04 DIAGNOSIS — N400 Enlarged prostate without lower urinary tract symptoms: Secondary | ICD-10-CM

## 2023-11-04 DIAGNOSIS — I959 Hypotension, unspecified: Secondary | ICD-10-CM | POA: Diagnosis not present

## 2023-11-04 DIAGNOSIS — R55 Syncope and collapse: Secondary | ICD-10-CM | POA: Diagnosis not present

## 2023-11-04 DIAGNOSIS — I10 Essential (primary) hypertension: Secondary | ICD-10-CM | POA: Diagnosis not present

## 2023-11-04 MED ORDER — FINASTERIDE 5 MG PO TABS
5.0000 mg | ORAL_TABLET | Freq: Every day | ORAL | 2 refills | Status: AC
  Filled 2023-11-04: qty 30, 30d supply, fill #0

## 2023-11-04 NOTE — Plan of Care (Signed)
  Problem: Clinical Measurements: Goal: Ability to maintain clinical measurements within normal limits will improve Outcome: Adequate for Discharge Goal: Will remain free from infection Outcome: Adequate for Discharge Goal: Diagnostic test results will improve Outcome: Adequate for Discharge Goal: Respiratory complications will improve Outcome: Adequate for Discharge Goal: Cardiovascular complication will be avoided Outcome: Adequate for Discharge   Problem: Health Behavior/Discharge Planning: Goal: Ability to manage health-related needs will improve Outcome: Adequate for Discharge

## 2023-11-04 NOTE — Progress Notes (Signed)
 DISCHARGE NOTE HOME [place] J Buer to be discharged Home per MD order. Discussed prescriptions and follow up appointments with the patient. Prescriptions given to patient; medication list explained in detail. Patient verbalized understanding.  Skin clean, dry and intact without evidence of skin break down, no evidence of skin tears noted. IV catheter discontinued intact. Site without signs and symptoms of complications. Dressing and pressure applied. Pt denies pain at the site currently. No complaints noted.  Patient free of lines, drains, and wounds.   An After Visit Summary (AVS) was printed and given to the patient. Patient escorted via wheelchair, and discharged home via private auto.  Peyton SHAUNNA Pepper, RN

## 2023-11-04 NOTE — Discharge Summary (Signed)
 PATIENT DETAILS Name: Christopher Acevedo Age: 83 y.o. Sex: male Date of Birth: September 22, 1940 MRN: [id number]. Admitting Physician: Burgess JAYSON Dare, MD ERE:Fnmmnt, Beverley, MD  Admit Date: 11/02/2023 Discharge date: 11/04/2023  Recommendations for Outpatient Follow-up:  Follow up with PCP in 1-2 weeks Please obtain CMP/CBC in one week  Admitted From:  Home  Disposition: Home   Discharge Condition: good  CODE STATUS:   Code Status: Full Code   Diet recommendation:  Diet Order             Diet - low sodium heart healthy           Diet Heart Room service appropriate? Yes; Fluid consistency: Thin  Diet effective now                    Brief Summary:  [place] Christopher Acevedo is a 83 y.o. male with medical history significant of HLD, BPH-who presented to the ED for evaluation of a syncopal episode.   Brief Hospital Course: Syncope With prodrome of lightheadedness prior to syncope-hypotensive at PCPs office-orthostatic vital signs positive in the emergency room-all point to orthostatic hypotension has the etiology.  Suspect this occurred in the setting of bowel purge-and use of Flomax (apparently takes 0.8 mg in the morning) Admitted-hydrated briefly with IVF-Flomax held-echo stable-ambulated with mobility tech yesterday without any dizziness or lightheadedness.  Compression stockings was also placed He feels much better-telemetry overnight unremarkable-stable for discharge with close outpatient follow-up with PCP.  If he has recurrent syncope-outpatient cardiology follow-up/30-day heart monitor would be recommended. G  BPH Has had issues with intermittent lightheadedness-which are exclusively postural for the past year. Given syncope-high suspicion that this is all related to  orthostatic hypotension-Will stop Flomax and switch him to finasteride.   HLD Continue statin   Discharge Diagnoses:  Principal Problem:   Syncope Active Problems:   AAA (abdominal aortic aneurysm)  without rupture   Essential (primary) hypertension   BPH (benign prostatic hyperplasia)   Discharge Instructions:  Activity:  As tolerated    Discharge Instructions     Diet - low sodium heart healthy   Complete by: As directed    Discharge instructions   Complete by: As directed    Follow with Primary MD  Kip Beverley, MD in 1-2 weeks  Please get a complete blood count and chemistry panel checked by your Primary MD at your next visit, and again as instructed by your Primary MD.  Get Medicines reviewed and adjusted: Please take all your medications with you for your next visit with your Primary MD  Laboratory/radiological data: Please request your Primary MD to go over all hospital tests and procedure/radiological results at the follow up, please ask your Primary MD to get all Hospital records sent to his/her office.  In some cases, they will be blood work, cultures and biopsy results pending at the time of your discharge. Please request that your primary care M.D. follows up on these results.  Also Note the following: If you experience worsening of your admission symptoms, develop shortness of breath, life threatening emergency, suicidal or homicidal thoughts you must seek medical attention immediately by calling 911 or calling your MD immediately  if symptoms less severe.  You must read complete instructions/literature along with all the possible adverse reactions/side effects for all the Medicines you take and that have been prescribed to you. Take any new Medicines after you have completely understood and accpet all the possible adverse reactions/side effects.   Do  not drive when taking Pain medications or sleeping medications (Benzodaizepines)  Do not take more than prescribed Pain, Sleep and Anxiety Medications. It is not advisable to combine anxiety,sleep and pain medications without talking with your primary care practitioner  Special Instructions: If you have smoked or  chewed Tobacco  in the last 2 yrs please stop smoking, stop any regular Alcohol  and or any Recreational drug use.  Wear Seat belts while driving.  Please note: You were cared for by a hospitalist during your hospital stay. Once you are discharged, your primary care physician will handle any further medical issues. Please note that NO REFILLS for any discharge medications will be authorized once you are discharged, as it is imperative that you return to your primary care physician (or establish a relationship with a primary care physician if you do not have one) for your post hospital discharge needs so that they can reassess your need for medications and monitor your lab values.   Increase activity slowly   Complete by: As directed       Allergies as of 11/04/2023       Reactions   Codeine Nausea And Vomiting   FELT FAINT        Medication List     STOP taking these medications    tamsulosin 0.4 MG Caps capsule Commonly known as: FLOMAX       TAKE these medications    acetaminophen  500 MG tablet Commonly known as: TYLENOL  Take 500-1,000 mg by mouth every 6 (six) hours as needed for moderate pain (pain score 4-6).   aspirin EC 81 MG tablet Take 81 mg by mouth daily. Swallow whole.   atorvastatin 40 MG tablet Commonly known as: LIPITOR Take 40 mg by mouth daily.   BLINK TEARS OP Place 1 drop into both eyes as needed (Allergies).   cholecalciferol 1000 units tablet Commonly known as: VITAMIN D Take 1,000 Units by mouth every morning. Reported on 07/08/2015   co-enzyme Q-10 30 MG capsule Take 30 mg by mouth daily.   finasteride 5 MG tablet Commonly known as: PROSCAR Take 1 tablet (5 mg total) by mouth daily.   PROBIOTIC PO Take 1 tablet by mouth daily.        Follow-up Information     Kip Righter, MD. Schedule an appointment as soon as possible for a visit in 1 week(s).   Specialty: Family Medicine Contact information: [address] [place] [address] [phone]                Allergies  Allergen Reactions   Codeine Nausea And Vomiting    FELT FAINT     Other Procedures/Studies: ECHOCARDIOGRAM COMPLETE Result Date: 11/03/2023    ECHOCARDIOGRAM REPORT   Patient Name:   Christopher Acevedo Date of Exam: 11/03/2023 Medical Rec #:  [id number]       Height:       72.0 in Accession #:    [id number]      Weight:       252.0 lb Date of Birth:  1940/02/09       BSA:          2.350 m Patient Age:    83 years        BP:           147/92 mmHg Patient Gender: M               HR:  67 bpm. Exam Location:  Inpatient Procedure: 2D Echo, Color Doppler and Cardiac Doppler (Both Spectral and Color            Flow Doppler were utilized during procedure). Indications:    R55 Syncope  History:        Patient has no prior history of Echocardiogram examinations.                 Risk Factors:Hypertension, Dyslipidemia and Sleep Apnea.  Sonographer:    Damien Senior RDCS Referring Phys: LEOMA DONALDA HERO Savahna Casados IMPRESSIONS  1. Left ventricular ejection fraction, by estimation, is 60 to 65%. The left ventricle has normal function. The left ventricle has no regional wall motion abnormalities. There is severe asymmetric left ventricular hypertrophy of the basal-septal segment. Left ventricular diastolic parameters are indeterminate.  2. Right ventricular systolic function is normal. The right ventricular size is normal. There is normal pulmonary artery systolic pressure. The estimated right ventricular systolic pressure is 28.4 mmHg.  3. Left atrial size was upper normal.  4. The mitral valve is grossly normal. Trivial mitral valve regurgitation.  5. The left coronary cusp is calcified and fixed without evidence of stenosis. The aortic valve is tricuspid. There is mild calcification of the aortic valve. Aortic valve regurgitation is not visualized. Aortic valve sclerosis/calcification is present,  without any evidence of aortic stenosis.  Aortic valve mean gradient measures 4.0 mmHg.  6. Aortic dilatation noted. There is borderline dilatation of the aortic root, measuring 40 mm.  7. The inferior vena cava is normal in size with greater than 50% respiratory variability, suggesting right atrial pressure of 3 mmHg. Comparison(s): No prior Echocardiogram. FINDINGS  Left Ventricle: Left ventricular ejection fraction, by estimation, is 60 to 65%. The left ventricle has normal function. The left ventricle has no regional wall motion abnormalities. The left ventricular internal cavity size was normal in size. There is  severe asymmetric left ventricular hypertrophy of the basal-septal segment. Left ventricular diastolic parameters are indeterminate. Right Ventricle: The right ventricular size is normal. No increase in right ventricular wall thickness. Right ventricular systolic function is normal. There is normal pulmonary artery systolic pressure. The tricuspid regurgitant velocity is 2.52 m/s, and  with an assumed right atrial pressure of 3 mmHg, the estimated right ventricular systolic pressure is 28.4 mmHg. Left Atrium: Left atrial size was upper normal. Right Atrium: Right atrial size was normal in size. Pericardium: Trivial pericardial effusion is present. Mitral Valve: The mitral valve is grossly normal. Trivial mitral valve regurgitation. Tricuspid Valve: The tricuspid valve is grossly normal. Tricuspid valve regurgitation is mild. Aortic Valve: The left coronary cusp is calcified and fixed without evidence of stenosis. The aortic valve is tricuspid. There is mild calcification of the aortic valve. There is mild aortic valve annular calcification. Aortic valve regurgitation is not visualized. Aortic valve sclerosis/calcification is present, without any evidence of aortic stenosis. Aortic valve mean gradient measures 4.0 mmHg. Aortic valve peak gradient measures 8.3 mmHg. Aortic valve area, by VTI measures 3.03 cm. Pulmonic Valve: The pulmonic valve  was grossly normal. Pulmonic valve regurgitation is trivial. Aorta: Aortic dilatation noted. There is borderline dilatation of the aortic root, measuring 40 mm. Venous: The inferior vena cava is normal in size with greater than 50% respiratory variability, suggesting right atrial pressure of 3 mmHg. IAS/Shunts: No atrial level shunt detected by color flow Doppler. Additional Comments: 3D was performed not requiring image post processing on an independent workstation and was indeterminate.  LEFT VENTRICLE PLAX  2D LVIDd:         4.10 cm   Diastology LVIDs:         2.90 cm   LV e' medial:    6.09 cm/s LV PW:         1.20 cm   LV E/e' medial:  13.9 LV IVS:        1.60 cm   LV e' lateral:   7.40 cm/s LVOT diam:     2.30 cm   LV E/e' lateral: 11.5 LV SV:         98 LV SV Index:   42 LVOT Area:     4.15 cm LV IVRT:       116 msec  RIGHT VENTRICLE RV S prime:     13.70 cm/s TAPSE (M-mode): 2.5 cm LEFT ATRIUM             Index        RIGHT ATRIUM           Index LA diam:        3.60 cm 1.53 cm/m   RA Area:     24.60 cm LA Vol (A2C):   67.4 ml 28.68 ml/m  RA Volume:   69.80 ml  29.70 ml/m LA Vol (A4C):   84.5 ml 35.95 ml/m LA Biplane Vol: 76.3 ml 32.46 ml/m  AORTIC VALVE AV Area (Vmax):    2.94 cm AV Area (Vmean):   3.10 cm AV Area (VTI):     3.03 cm AV Vmax:           144.00 cm/s AV Vmean:          99.600 cm/s AV VTI:            0.325 m AV Peak Grad:      8.3 mmHg AV Mean Grad:      4.0 mmHg LVOT Vmax:         102.00 cm/s LVOT Vmean:        74.300 cm/s LVOT VTI:          0.237 m LVOT/AV VTI ratio: 0.73  AORTA Ao Root diam: 4.00 cm Ao Asc diam:  3.30 cm MITRAL VALVE               TRICUSPID VALVE MV Area (PHT): 2.62 cm    TR Peak grad:   25.4 mmHg MV Decel Time: 290 msec    TR Vmax:        252.00 cm/s MV E velocity: 84.90 cm/s MV A velocity: 83.30 cm/s  SHUNTS MV E/A ratio:  1.02        Systemic VTI:  0.24 m                            Systemic Diam: 2.30 cm Jayson Sierras MD Electronically signed by Jayson Sierras  MD Signature Date/Time: 11/03/2023/4:17:30 PM    Final    CT Angio Chest Pulmonary Embolism (PE) W or WO Contrast Result Date: 11/03/2023 CLINICAL DATA:  Elevated D-dimer and chest pain, initial encounter EXAM: CT ANGIOGRAPHY CHEST WITH CONTRAST TECHNIQUE: Multidetector CT imaging of the chest was performed using the standard protocol during bolus administration of intravenous contrast. Multiplanar CT image reconstructions and MIPs were obtained to evaluate the vascular anatomy. RADIATION DOSE REDUCTION: This exam was performed according to the departmental dose-optimization program which includes automated exposure control, adjustment of the mA and/or kV according to patient size and/or use of  iterative reconstruction technique. CONTRAST:  75mL OMNIPAQUE  IOHEXOL  350 MG/ML SOLN COMPARISON:  None Available. FINDINGS: Cardiovascular: Atherosclerotic calcifications of the thoracic aorta are noted. No aneurysmal dilatation is seen. The degree of opacification of the aorta is limited precluding evaluation for dissection. The pulmonary artery shows a normal branching pattern bilaterally. No filling defect to suggest pulmonary embolism is noted. No cardiac enlargement is noted. Coronary calcifications are seen. Mediastinum/Nodes: Thoracic inlet is within normal limits. No hilar or mediastinal adenopathy is noted. The esophagus as visualized is within normal limits. Lungs/Pleura: Lungs are well aerated bilaterally. No focal infiltrate or sizable effusion is seen. Upper Abdomen: Visualized upper abdomen is within normal limits. Musculoskeletal: Degenerative changes of the thoracic spine noted. No acute rib abnormality is seen. Review of the MIP images confirms the above findings. IMPRESSION: No evidence of pulmonary embolism. Electronically Signed   By: Oneil Devonshire M.D.   On: 11/03/2023 03:01   CT Head Wo Contrast Result Date: 11/02/2023 CLINICAL DATA:  Syncope/presyncope. EXAM: CT HEAD WITHOUT CONTRAST TECHNIQUE:  Contiguous axial images were obtained from the base of the skull through the vertex without intravenous contrast. RADIATION DOSE REDUCTION: This exam was performed according to the departmental dose-optimization program which includes automated exposure control, adjustment of the mA and/or kV according to patient size and/or use of iterative reconstruction technique. COMPARISON:  None Available. FINDINGS: Brain: There is a generalized cerebral atrophy with widening of the extra-axial spaces and ventricular dilatation. There are areas of decreased attenuation within the white matter tracts of the supratentorial brain, consistent with microvascular disease changes. Normal basal ganglia. Normal bilateral thalami. Vascular: No hyperdense vessel or unexpected calcification. Skull: Normal. Negative for fracture or focal lesion. Sinuses/Orbits: Chronic postoperative changes are seen involving the bilateral maxillary sinuses. Mild to moderate severity right ethmoid sinus mucosal thickening is present. Other: None. IMPRESSION: 1. Generalized cerebral atrophy with chronic white matter small vessel ischemic changes. 2. No acute intracranial abnormality. Electronically Signed   By: Suzen Dials M.D.   On: 11/02/2023 16:02     TODAY-DAY OF DISCHARGE:  Subjective:   Arby Cagley today has no headache,no chest abdominal pain,no new weakness tingling or numbness, feels much better wants to go home today.   Objective:   Blood pressure 135/86, pulse 65, temperature (!) 97.4 F (36.3 C), temperature source Oral, resp. rate 15, SpO2 93%.  Intake/Output Summary (Last 24 hours) at 11/04/2023 0817 Last data filed at 11/04/2023 0539 Gross per 24 hour  Intake --  Output 3250 ml  Net -3250 ml   There were no vitals filed for this visit.  Exam: Awake Alert, Oriented *3, No new F.N deficits, Normal affect [place].AT,PERRAL Supple Neck,No JVD, No cervical lymphadenopathy appriciated.  Symmetrical Chest wall  movement, Good air movement bilaterally, CTAB RRR,No Gallops,Rubs or new Murmurs, No Parasternal Heave +ve B.Sounds, Abd Soft, Non tender, No organomegaly appriciated, No rebound -guarding or rigidity. No Cyanosis, Clubbing or edema, No new Rash or bruise   PERTINENT RADIOLOGIC STUDIES: ECHOCARDIOGRAM COMPLETE Result Date: 11/03/2023    ECHOCARDIOGRAM REPORT   Patient Name:   Christopher Acevedo Date of Exam: 11/03/2023 Medical Rec #:  [id number]       Height:       72.0 in Accession #:    [id number]      Weight:       252.0 lb Date of Birth:  20-Aug-1940       BSA:          2.350 m Patient Age:  83 years        BP:           147/92 mmHg Patient Gender: M               HR:           67 bpm. Exam Location:  Inpatient Procedure: 2D Echo, Color Doppler and Cardiac Doppler (Both Spectral and Color            Flow Doppler were utilized during procedure). Indications:    R55 Syncope  History:        Patient has no prior history of Echocardiogram examinations.                 Risk Factors:Hypertension, Dyslipidemia and Sleep Apnea.  Sonographer:    Damien Senior RDCS Referring Phys: LEOMA DONALDA HERO Rhyanna Sorce IMPRESSIONS  1. Left ventricular ejection fraction, by estimation, is 60 to 65%. The left ventricle has normal function. The left ventricle has no regional wall motion abnormalities. There is severe asymmetric left ventricular hypertrophy of the basal-septal segment. Left ventricular diastolic parameters are indeterminate.  2. Right ventricular systolic function is normal. The right ventricular size is normal. There is normal pulmonary artery systolic pressure. The estimated right ventricular systolic pressure is 28.4 mmHg.  3. Left atrial size was upper normal.  4. The mitral valve is grossly normal. Trivial mitral valve regurgitation.  5. The left coronary cusp is calcified and fixed without evidence of stenosis. The aortic valve is tricuspid. There is mild calcification of the aortic valve. Aortic valve  regurgitation is not visualized. Aortic valve sclerosis/calcification is present,  without any evidence of aortic stenosis. Aortic valve mean gradient measures 4.0 mmHg.  6. Aortic dilatation noted. There is borderline dilatation of the aortic root, measuring 40 mm.  7. The inferior vena cava is normal in size with greater than 50% respiratory variability, suggesting right atrial pressure of 3 mmHg. Comparison(s): No prior Echocardiogram. FINDINGS  Left Ventricle: Left ventricular ejection fraction, by estimation, is 60 to 65%. The left ventricle has normal function. The left ventricle has no regional wall motion abnormalities. The left ventricular internal cavity size was normal in size. There is  severe asymmetric left ventricular hypertrophy of the basal-septal segment. Left ventricular diastolic parameters are indeterminate. Right Ventricle: The right ventricular size is normal. No increase in right ventricular wall thickness. Right ventricular systolic function is normal. There is normal pulmonary artery systolic pressure. The tricuspid regurgitant velocity is 2.52 m/s, and  with an assumed right atrial pressure of 3 mmHg, the estimated right ventricular systolic pressure is 28.4 mmHg. Left Atrium: Left atrial size was upper normal. Right Atrium: Right atrial size was normal in size. Pericardium: Trivial pericardial effusion is present. Mitral Valve: The mitral valve is grossly normal. Trivial mitral valve regurgitation. Tricuspid Valve: The tricuspid valve is grossly normal. Tricuspid valve regurgitation is mild. Aortic Valve: The left coronary cusp is calcified and fixed without evidence of stenosis. The aortic valve is tricuspid. There is mild calcification of the aortic valve. There is mild aortic valve annular calcification. Aortic valve regurgitation is not visualized. Aortic valve sclerosis/calcification is present, without any evidence of aortic stenosis. Aortic valve mean gradient measures 4.0 mmHg.  Aortic valve peak gradient measures 8.3 mmHg. Aortic valve area, by VTI measures 3.03 cm. Pulmonic Valve: The pulmonic valve was grossly normal. Pulmonic valve regurgitation is trivial. Aorta: Aortic dilatation noted. There is borderline dilatation of the aortic root, measuring 40 mm. Venous: The inferior  vena cava is normal in size with greater than 50% respiratory variability, suggesting right atrial pressure of 3 mmHg. IAS/Shunts: No atrial level shunt detected by color flow Doppler. Additional Comments: 3D was performed not requiring image post processing on an independent workstation and was indeterminate.  LEFT VENTRICLE PLAX 2D LVIDd:         4.10 cm   Diastology LVIDs:         2.90 cm   LV e' medial:    6.09 cm/s LV PW:         1.20 cm   LV E/e' medial:  13.9 LV IVS:        1.60 cm   LV e' lateral:   7.40 cm/s LVOT diam:     2.30 cm   LV E/e' lateral: 11.5 LV SV:         98 LV SV Index:   42 LVOT Area:     4.15 cm LV IVRT:       116 msec  RIGHT VENTRICLE RV S prime:     13.70 cm/s TAPSE (M-mode): 2.5 cm LEFT ATRIUM             Index        RIGHT ATRIUM           Index LA diam:        3.60 cm 1.53 cm/m   RA Area:     24.60 cm LA Vol (A2C):   67.4 ml 28.68 ml/m  RA Volume:   69.80 ml  29.70 ml/m LA Vol (A4C):   84.5 ml 35.95 ml/m LA Biplane Vol: 76.3 ml 32.46 ml/m  AORTIC VALVE AV Area (Vmax):    2.94 cm AV Area (Vmean):   3.10 cm AV Area (VTI):     3.03 cm AV Vmax:           144.00 cm/s AV Vmean:          99.600 cm/s AV VTI:            0.325 m AV Peak Grad:      8.3 mmHg AV Mean Grad:      4.0 mmHg LVOT Vmax:         102.00 cm/s LVOT Vmean:        74.300 cm/s LVOT VTI:          0.237 m LVOT/AV VTI ratio: 0.73  AORTA Ao Root diam: 4.00 cm Ao Asc diam:  3.30 cm MITRAL VALVE               TRICUSPID VALVE MV Area (PHT): 2.62 cm    TR Peak grad:   25.4 mmHg MV Decel Time: 290 msec    TR Vmax:        252.00 cm/s MV E velocity: 84.90 cm/s MV A velocity: 83.30 cm/s  SHUNTS MV E/A ratio:  1.02         Systemic VTI:  0.24 m                            Systemic Diam: 2.30 cm Jayson Sierras MD Electronically signed by Jayson Sierras MD Signature Date/Time: 11/03/2023/4:17:30 PM    Final    CT Angio Chest Pulmonary Embolism (PE) W or WO Contrast Result Date: 11/03/2023 CLINICAL DATA:  Elevated D-dimer and chest pain, initial encounter EXAM: CT ANGIOGRAPHY CHEST WITH CONTRAST TECHNIQUE: Multidetector CT imaging of the chest was performed using the standard protocol  during bolus administration of intravenous contrast. Multiplanar CT image reconstructions and MIPs were obtained to evaluate the vascular anatomy. RADIATION DOSE REDUCTION: This exam was performed according to the departmental dose-optimization program which includes automated exposure control, adjustment of the mA and/or kV according to patient size and/or use of iterative reconstruction technique. CONTRAST:  75mL OMNIPAQUE  IOHEXOL  350 MG/ML SOLN COMPARISON:  None Available. FINDINGS: Cardiovascular: Atherosclerotic calcifications of the thoracic aorta are noted. No aneurysmal dilatation is seen. The degree of opacification of the aorta is limited precluding evaluation for dissection. The pulmonary artery shows a normal branching pattern bilaterally. No filling defect to suggest pulmonary embolism is noted. No cardiac enlargement is noted. Coronary calcifications are seen. Mediastinum/Nodes: Thoracic inlet is within normal limits. No hilar or mediastinal adenopathy is noted. The esophagus as visualized is within normal limits. Lungs/Pleura: Lungs are well aerated bilaterally. No focal infiltrate or sizable effusion is seen. Upper Abdomen: Visualized upper abdomen is within normal limits. Musculoskeletal: Degenerative changes of the thoracic spine noted. No acute rib abnormality is seen. Review of the MIP images confirms the above findings. IMPRESSION: No evidence of pulmonary embolism. Electronically Signed   By: Oneil Devonshire M.D.   On: 11/03/2023  03:01   CT Head Wo Contrast Result Date: 11/02/2023 CLINICAL DATA:  Syncope/presyncope. EXAM: CT HEAD WITHOUT CONTRAST TECHNIQUE: Contiguous axial images were obtained from the base of the skull through the vertex without intravenous contrast. RADIATION DOSE REDUCTION: This exam was performed according to the departmental dose-optimization program which includes automated exposure control, adjustment of the mA and/or kV according to patient size and/or use of iterative reconstruction technique. COMPARISON:  None Available. FINDINGS: Brain: There is a generalized cerebral atrophy with widening of the extra-axial spaces and ventricular dilatation. There are areas of decreased attenuation within the white matter tracts of the supratentorial brain, consistent with microvascular disease changes. Normal basal ganglia. Normal bilateral thalami. Vascular: No hyperdense vessel or unexpected calcification. Skull: Normal. Negative for fracture or focal lesion. Sinuses/Orbits: Chronic postoperative changes are seen involving the bilateral maxillary sinuses. Mild to moderate severity right ethmoid sinus mucosal thickening is present. Other: None. IMPRESSION: 1. Generalized cerebral atrophy with chronic white matter small vessel ischemic changes. 2. No acute intracranial abnormality. Electronically Signed   By: Suzen Dials M.D.   On: 11/02/2023 16:02     PERTINENT LAB RESULTS: CBC: Recent Labs    11/02/23 1125 11/03/23 0546  WBC 9.0 7.5  HGB 12.9* 13.0  HCT 37.5* 37.5*  PLT 181 169   CMET CMP     Component Value Date/Time   NA 138 11/03/2023 0546   K 4.2 11/03/2023 0546   CL 101 11/03/2023 0546   CO2 28 11/03/2023 0546   GLUCOSE 95 11/03/2023 0546   BUN 11 11/03/2023 0546   CREATININE 0.79 11/03/2023 0546   CALCIUM 8.7 (L) 11/03/2023 0546   PROT 6.2 (L) 11/02/2023 1125   ALBUMIN 4.0 11/02/2023 1125   AST 25 11/02/2023 1125   ALT 15 11/02/2023 1125   ALKPHOS 72 11/02/2023 1125   BILITOT  0.6 11/02/2023 1125   GFRNONAA >60 11/03/2023 0546    GFR CrCl cannot be calculated (Unknown ideal weight.). No results for input(s): LIPASE, AMYLASE in the last 72 hours. No results for input(s): CKTOTAL, CKMB, CKMBINDEX, TROPONINI in the last 72 hours. Invalid input(s): POCBNP Recent Labs    11/02/23 1721  DDIMER 1.09*   No results for input(s): HGBA1C in the last 72 hours. No results for input(s): CHOL, HDL,  LDLCALC, TRIG, CHOLHDL, LDLDIRECT in the last 72 hours. No results for input(s): TSH, T4TOTAL, T3FREE, THYROIDAB in the last 72 hours.  Invalid input(s): FREET3 No results for input(s): VITAMINB12, FOLATE, FERRITIN, TIBC, IRON, RETICCTPCT in the last 72 hours. Coags: No results for input(s): INR in the last 72 hours.  Invalid input(s): PT Microbiology: No results found for this or any previous visit (from the past 240 hours).  FURTHER DISCHARGE INSTRUCTIONS:  Get Medicines reviewed and adjusted: Please take all your medications with you for your next visit with your Primary MD  Laboratory/radiological data: Please request your Primary MD to go over all hospital tests and procedure/radiological results at the follow up, please ask your Primary MD to get all Hospital records sent to his/her office.  In some cases, they will be blood work, cultures and biopsy results pending at the time of your discharge. Please request that your primary care M.D. goes through all the records of your hospital data and follows up on these results.  Also Note the following: If you experience worsening of your admission symptoms, develop shortness of breath, life threatening emergency, suicidal or homicidal thoughts you must seek medical attention immediately by calling 911 or calling your MD immediately  if symptoms less severe.  You must read complete instructions/literature along with all the possible adverse reactions/side effects for  all the Medicines you take and that have been prescribed to you. Take any new Medicines after you have completely understood and accpet all the possible adverse reactions/side effects.   Do not drive when taking Pain medications or sleeping medications (Benzodaizepines)  Do not take more than prescribed Pain, Sleep and Anxiety Medications. It is not advisable to combine anxiety,sleep and pain medications without talking with your primary care practitioner  Special Instructions: If you have smoked or chewed Tobacco  in the last 2 yrs please stop smoking, stop any regular Alcohol  and or any Recreational drug use.  Wear Seat belts while driving.  Please note: You were cared for by a hospitalist during your hospital stay. Once you are discharged, your primary care physician will handle any further medical issues. Please note that NO REFILLS for any discharge medications will be authorized once you are discharged, as it is imperative that you return to your primary care physician (or establish a relationship with a primary care physician if you do not have one) for your post hospital discharge needs so that they can reassess your need for medications and monitor your lab values.  Total Time spent coordinating discharge including counseling, education and face to face time equals greater than 30 minutes.  SignedBETHA Donalda Applebaum 11/04/2023 8:17 AM

## 2023-11-08 DIAGNOSIS — R55 Syncope and collapse: Secondary | ICD-10-CM | POA: Diagnosis not present

## 2023-11-09 DIAGNOSIS — I10 Essential (primary) hypertension: Secondary | ICD-10-CM | POA: Diagnosis not present

## 2023-11-09 DIAGNOSIS — E1142 Type 2 diabetes mellitus with diabetic polyneuropathy: Secondary | ICD-10-CM | POA: Diagnosis not present

## 2023-11-09 DIAGNOSIS — I2510 Atherosclerotic heart disease of native coronary artery without angina pectoris: Secondary | ICD-10-CM | POA: Diagnosis not present

## 2023-11-09 DIAGNOSIS — E1169 Type 2 diabetes mellitus with other specified complication: Secondary | ICD-10-CM | POA: Diagnosis not present

## 2023-11-14 ENCOUNTER — Other Ambulatory Visit: Payer: Self-pay | Admitting: Family Medicine

## 2023-11-14 DIAGNOSIS — R55 Syncope and collapse: Secondary | ICD-10-CM

## 2023-11-14 DIAGNOSIS — R0989 Other specified symptoms and signs involving the circulatory and respiratory systems: Secondary | ICD-10-CM

## 2023-11-19 ENCOUNTER — Ambulatory Visit
Admission: RE | Admit: 2023-11-19 | Discharge: 2023-11-19 | Disposition: A | Source: Ambulatory Visit | Attending: Family Medicine | Admitting: Family Medicine

## 2023-11-19 DIAGNOSIS — R0989 Other specified symptoms and signs involving the circulatory and respiratory systems: Secondary | ICD-10-CM

## 2023-11-19 DIAGNOSIS — I6523 Occlusion and stenosis of bilateral carotid arteries: Secondary | ICD-10-CM | POA: Diagnosis not present

## 2023-11-19 DIAGNOSIS — R55 Syncope and collapse: Secondary | ICD-10-CM

## 2023-11-21 DIAGNOSIS — C44529 Squamous cell carcinoma of skin of other part of trunk: Secondary | ICD-10-CM | POA: Diagnosis not present

## 2023-11-21 DIAGNOSIS — L905 Scar conditions and fibrosis of skin: Secondary | ICD-10-CM | POA: Diagnosis not present

## 2023-11-23 DIAGNOSIS — I2510 Atherosclerotic heart disease of native coronary artery without angina pectoris: Secondary | ICD-10-CM | POA: Diagnosis not present

## 2023-11-23 DIAGNOSIS — F324 Major depressive disorder, single episode, in partial remission: Secondary | ICD-10-CM | POA: Diagnosis not present

## 2023-11-23 DIAGNOSIS — E1169 Type 2 diabetes mellitus with other specified complication: Secondary | ICD-10-CM | POA: Diagnosis not present

## 2023-11-23 DIAGNOSIS — I10 Essential (primary) hypertension: Secondary | ICD-10-CM | POA: Diagnosis not present

## 2023-11-23 DIAGNOSIS — E785 Hyperlipidemia, unspecified: Secondary | ICD-10-CM | POA: Diagnosis not present

## 2023-11-23 DIAGNOSIS — E1142 Type 2 diabetes mellitus with diabetic polyneuropathy: Secondary | ICD-10-CM | POA: Diagnosis not present

## 2023-12-14 DIAGNOSIS — M1712 Unilateral primary osteoarthritis, left knee: Secondary | ICD-10-CM | POA: Diagnosis not present

## 2023-12-18 DIAGNOSIS — N400 Enlarged prostate without lower urinary tract symptoms: Secondary | ICD-10-CM | POA: Diagnosis not present

## 2023-12-18 DIAGNOSIS — E669 Obesity, unspecified: Secondary | ICD-10-CM | POA: Diagnosis not present

## 2023-12-18 DIAGNOSIS — E785 Hyperlipidemia, unspecified: Secondary | ICD-10-CM | POA: Diagnosis not present

## 2023-12-18 DIAGNOSIS — G629 Polyneuropathy, unspecified: Secondary | ICD-10-CM | POA: Diagnosis not present

## 2023-12-18 DIAGNOSIS — I10 Essential (primary) hypertension: Secondary | ICD-10-CM | POA: Diagnosis not present

## 2023-12-18 DIAGNOSIS — Z8249 Family history of ischemic heart disease and other diseases of the circulatory system: Secondary | ICD-10-CM | POA: Diagnosis not present

## 2023-12-18 DIAGNOSIS — M1990 Unspecified osteoarthritis, unspecified site: Secondary | ICD-10-CM | POA: Diagnosis not present

## 2023-12-18 DIAGNOSIS — I719 Aortic aneurysm of unspecified site, without rupture: Secondary | ICD-10-CM | POA: Diagnosis not present

## 2023-12-18 DIAGNOSIS — I2510 Atherosclerotic heart disease of native coronary artery without angina pectoris: Secondary | ICD-10-CM | POA: Diagnosis not present

## 2023-12-23 DIAGNOSIS — F324 Major depressive disorder, single episode, in partial remission: Secondary | ICD-10-CM | POA: Diagnosis not present

## 2023-12-23 DIAGNOSIS — I10 Essential (primary) hypertension: Secondary | ICD-10-CM | POA: Diagnosis not present

## 2023-12-23 DIAGNOSIS — E1142 Type 2 diabetes mellitus with diabetic polyneuropathy: Secondary | ICD-10-CM | POA: Diagnosis not present

## 2023-12-23 DIAGNOSIS — E1169 Type 2 diabetes mellitus with other specified complication: Secondary | ICD-10-CM | POA: Diagnosis not present

## 2023-12-23 DIAGNOSIS — I2510 Atherosclerotic heart disease of native coronary artery without angina pectoris: Secondary | ICD-10-CM | POA: Diagnosis not present

## 2023-12-23 DIAGNOSIS — E785 Hyperlipidemia, unspecified: Secondary | ICD-10-CM | POA: Diagnosis not present

## 2023-12-26 ENCOUNTER — Encounter: Payer: Self-pay | Admitting: Vascular Surgery

## 2023-12-26 ENCOUNTER — Ambulatory Visit: Attending: Vascular Surgery | Admitting: Vascular Surgery

## 2023-12-26 VITALS — BP 104/62 | HR 63 | Temp 97.9°F | Ht 72.0 in | Wt 247.0 lb

## 2023-12-26 DIAGNOSIS — I6523 Occlusion and stenosis of bilateral carotid arteries: Secondary | ICD-10-CM

## 2023-12-26 DIAGNOSIS — I7143 Infrarenal abdominal aortic aneurysm, without rupture: Secondary | ICD-10-CM | POA: Diagnosis not present

## 2023-12-26 NOTE — Progress Notes (Signed)
 Patient ID: Christopher Acevedo, male   DOB: February 19, 1940, 83 y.o.   MRN: [id number]  Reason for Consult: Follow-up   Referred by Kip Righter, MD  Subjective:     HPI:  Christopher Acevedo is a 83 y.o. male with history of abdominal aortic aneurysm recently evaluated for lightheadedness and found to have bilateral carotid artery stenosis.  Ultimately he was diagnosed with orthostatic hypotension secondary to Flomax .  Patient states that he has been in his usual state of health denies any history of stroke, TIA or amaurosis.  No new back or abdominal pain.  He tells me that his wife died last summer and this has been very difficult for him in the past few months.  Otherwise he is at his usual state of health.  Past Medical History:  Diagnosis Date   Actinic keratosis    DRY SKIN WITH LESIONS- MOSTLY ON FEET AND LOWER LEGS   Arthritis    Cancer (HCC)    nose basal cell carcinoma, neck melanoma   Chronic sinusitis    nasal polyp   Diverticulosis    LOWER ABD PAIN AND DIARRHEA - MOST RECENT EPISODE WAS 11/25/13- BETTER TODAY   Dry eyes    Elevated cholesterol    Environmental and seasonal allergies    GERD (gastroesophageal reflux disease)    Glaucoma    LEFT EYE   Gout    Hearing loss    BOTH EARS- HAS HEARING AIDS   Hypertension    Pre-diabetes    Sleep apnea    uses nightly   Spasm of eyelids    PT [facility]   Family History  Problem Relation Age of Onset   Rheum arthritis Mother    Allergic rhinitis Neg Hx    Angioedema Neg Hx    Asthma Neg Hx    Atopy Neg Hx    Eczema Neg Hx    Immunodeficiency Neg Hx    Urticaria Neg Hx    Past Surgical History:  Procedure Laterality Date   BLEPHROPLASTY Bilateral    COLONOSCOPY W/ BIOPSIES AND POLYPECTOMY     CYST EXCISION Left 03/04/2020   Procedure: MUCOID CYST EXCISION WITH INTERPHALANGEAL JOINT DEBRIDEMENT;  Surgeon: Murrell Kuba, MD;  Location: [facility];   Service: Orthopedics;  Laterality: Left;  FOREARM BLOCK   HERNIA REPAIR  1990s?   NASAL SINUS SURGERY     POLYPECTOMY N/A 03/10/2016   Procedure: POLYPECTOMY NASAL;  Surgeon: Alm Bouche, MD;  Location[facility] OR;  Service: ENT;  Laterality: N/A;   RIGHT SHOULDER SURGERY     RIGHT THUMB AND INDEX FINGER SURGERY FOR ARTHRITIS     SINUS ENDO WITH FUSION Bilateral 03/10/2016   Procedure: BILATERAL REVISION SINUS ENDOSCOPIC WITH FUSION;  Surgeon: Alm Bouche, MD;  Location: [facility] OR;  Service: ENT;  Laterality: Bilateral;   TRANSANAL HEMORRHOIDAL DEARTERIALIZATION N/A 12/02/2013   Procedure: TRANSANAL HEMORRHOIDAL DEARTERIALIZATION AND LIGATION/PEXY, EXTERNAL HEMORRHOIDECTOMY, REMOVAL OF GLUTEAL MASS;  Surgeon: Elspeth Schultze, MD;  Location: [facility];  Service: General;  Laterality: N/A;    Short Social History:  Social History   Tobacco Use   Smoking status: Former    Current packs/day: 0.00    Average packs/day: 1 pack/day for 35.0 years (35.0 ttl pk-yrs)    Types: Cigarettes    Start date: 01/23/1969    Quit date: 01/24/2004    Years since quitting: 19.9   Smokeless tobacco:  Former    Types: Snuff, Chew  Substance Use Topics   Alcohol use: Yes    Alcohol/week: 4.0 standard drinks of alcohol    Types: 4 Shots of liquor per week    Comment: 4 shots of liquor each week    Allergies  Allergen Reactions   Codeine Nausea And Vomiting    FELT FAINT    Current Outpatient Medications  Medication Sig Dispense Refill   acetaminophen  (TYLENOL ) 500 MG tablet Take 500-1,000 mg by mouth every 6 (six) hours as needed for moderate pain (pain score 4-6).     aspirin  EC 81 MG tablet Take 81 mg by mouth daily. Swallow whole.     atorvastatin  (LIPITOR) 40 MG tablet Take 40 mg by mouth daily.     cholecalciferol  (VITAMIN D ) 1000 UNITS tablet Take 1,000 Units by mouth every morning. Reported on 07/08/2015     co-enzyme Q-10 30 MG capsule Take 30 mg by mouth daily.     finasteride  (PROSCAR ) 5 MG tablet  Take 1 tablet (5 mg total) by mouth daily. 90 tablet 2   Polyethylene Glycol 400 (BLINK TEARS OP) Place 1 drop into both eyes as needed (Allergies).     Probiotic Product (PROBIOTIC PO) Take 1 tablet by mouth daily.     No current facility-administered medications for this visit.    Review of Systems  Constitutional:  Constitutional negative. HENT: HENT negative.  Eyes: Eyes negative.  Respiratory: Respiratory negative.  Cardiovascular: Cardiovascular negative.  GI: Gastrointestinal negative.  Musculoskeletal: Musculoskeletal negative.  Skin: Skin negative.  Neurological: Positive for light-headedness.  Hematologic: Hematologic/lymphatic negative.  Psychiatric: Psychiatric negative.        Objective:  Objective  Vitals:   12/26/23 1341 12/26/23 1343  BP: 100/63 104/62  Pulse: 63   Temp: 97.9 F (36.6 C)   SpO2: 95%      Physical Exam HENT:     Head: Normocephalic.     Nose: Nose normal.  Eyes:     Pupils: Pupils are equal, round, and reactive to light.  Cardiovascular:     Rate and Rhythm: Normal rate.  Pulmonary:     Effort: Pulmonary effort is normal.  Abdominal:     General: Abdomen is flat.  Musculoskeletal:        General: Normal range of motion.     Cervical back: Normal range of motion.     Right lower leg: No edema.     Left lower leg: No edema.  Skin:    General: Skin is warm.     Capillary Refill: Capillary refill takes less than 2 seconds.  Neurological:     General: No focal deficit present.     Mental Status: He is alert.  Psychiatric:        Mood and Affect: Mood normal.     Data: Carotid Duplex IMPRESSION: 1. Right carotid artery system: Less than 50% stenosis secondary to mild multifocal atherosclerotic plaque formation.   2. Left carotid artery system: Less than 50% stenosis secondary to moderate multifocal atherosclerotic plaque formation, most prominent at the carotid bifurcation.   3.  Vertebral artery system: Patent with  antegrade flow bilaterally.     Assessment/Plan:     83 year old male well-known to me with abdominal aortic aneurysm with plans for follow-up this April.  Will repeat carotid duplex at that time and then can likely follow yearly as long as he remains asymptomatic.  He will continue aspirin  and statin.     Penne Bruckner  Sheree MD [facility]

## 2024-01-01 DIAGNOSIS — R030 Elevated blood-pressure reading, without diagnosis of hypertension: Secondary | ICD-10-CM | POA: Diagnosis not present

## 2024-01-01 DIAGNOSIS — I6523 Occlusion and stenosis of bilateral carotid arteries: Secondary | ICD-10-CM | POA: Diagnosis not present

## 2024-01-01 DIAGNOSIS — E785 Hyperlipidemia, unspecified: Secondary | ICD-10-CM | POA: Diagnosis not present

## 2024-01-01 DIAGNOSIS — I951 Orthostatic hypotension: Secondary | ICD-10-CM | POA: Diagnosis not present

## 2024-01-01 DIAGNOSIS — N401 Enlarged prostate with lower urinary tract symptoms: Secondary | ICD-10-CM | POA: Diagnosis not present

## 2024-01-01 DIAGNOSIS — E1169 Type 2 diabetes mellitus with other specified complication: Secondary | ICD-10-CM | POA: Diagnosis not present
